# Patient Record
Sex: Female | Born: 1940 | Race: White | Hispanic: No | Marital: Married | Smoking: Current every day smoker
Health system: Southern US, Community
[De-identification: ages and names within clinical notes are randomized; demographics above are authoritative.]

## PROBLEM LIST (undated history)

## (undated) DIAGNOSIS — I4891 Unspecified atrial fibrillation: Secondary | ICD-10-CM

## (undated) DIAGNOSIS — I495 Sick sinus syndrome: Secondary | ICD-10-CM

## (undated) DIAGNOSIS — I4819 Other persistent atrial fibrillation: Secondary | ICD-10-CM

## (undated) DIAGNOSIS — I499 Cardiac arrhythmia, unspecified: Secondary | ICD-10-CM

## (undated) DIAGNOSIS — I10 Essential (primary) hypertension: Secondary | ICD-10-CM

## (undated) DIAGNOSIS — I639 Cerebral infarction, unspecified: Secondary | ICD-10-CM

## (undated) DIAGNOSIS — I350 Nonrheumatic aortic (valve) stenosis: Secondary | ICD-10-CM

## (undated) DIAGNOSIS — G8929 Other chronic pain: Secondary | ICD-10-CM

## (undated) DIAGNOSIS — Z950 Presence of cardiac pacemaker: Secondary | ICD-10-CM

## (undated) DIAGNOSIS — R011 Cardiac murmur, unspecified: Secondary | ICD-10-CM

## (undated) DIAGNOSIS — Z8719 Personal history of other diseases of the digestive system: Secondary | ICD-10-CM

## (undated) DIAGNOSIS — M1990 Unspecified osteoarthritis, unspecified site: Secondary | ICD-10-CM

## (undated) HISTORY — DX: Other chronic pain: G89.29

## (undated) HISTORY — PX: CERVICAL SPINE SURGERY: SHX589

## (undated) HISTORY — DX: Sick sinus syndrome: I49.5

## (undated) HISTORY — PX: OTHER SURGICAL HISTORY: SHX169

## (undated) HISTORY — PX: PACEMAKER IMPLANT: EP1218

## (undated) HISTORY — PX: EYE SURGERY: SHX253

## (undated) HISTORY — DX: Nonrheumatic aortic (valve) stenosis: I35.0

## (undated) HISTORY — DX: Unspecified osteoarthritis, unspecified site: M19.90

---

## 2013-09-19 DIAGNOSIS — Z6831 Body mass index (BMI) 31.0-31.9, adult: Secondary | ICD-10-CM | POA: Insufficient documentation

## 2013-09-19 DIAGNOSIS — Q210 Ventricular septal defect: Secondary | ICD-10-CM

## 2013-09-19 HISTORY — DX: Other obesity due to excess calories: Z68.31

## 2013-09-19 HISTORY — DX: Ventricular septal defect: Q21.0

## 2014-05-25 DIAGNOSIS — F419 Anxiety disorder, unspecified: Secondary | ICD-10-CM | POA: Insufficient documentation

## 2014-05-25 HISTORY — DX: Anxiety disorder, unspecified: F41.9

## 2014-09-02 DIAGNOSIS — R7302 Impaired glucose tolerance (oral): Secondary | ICD-10-CM

## 2014-09-02 HISTORY — DX: Impaired glucose tolerance (oral): R73.02

## 2014-12-14 DIAGNOSIS — I10 Essential (primary) hypertension: Secondary | ICD-10-CM

## 2014-12-14 HISTORY — DX: Essential (primary) hypertension: I10

## 2016-02-18 DIAGNOSIS — G4700 Insomnia, unspecified: Secondary | ICD-10-CM | POA: Insufficient documentation

## 2016-02-18 HISTORY — DX: Insomnia, unspecified: G47.00

## 2017-01-23 DIAGNOSIS — S42292A Other displaced fracture of upper end of left humerus, initial encounter for closed fracture: Secondary | ICD-10-CM

## 2017-01-23 HISTORY — DX: Other displaced fracture of upper end of left humerus, initial encounter for closed fracture: S42.292A

## 2017-08-02 DIAGNOSIS — S52502D Unspecified fracture of the lower end of left radius, subsequent encounter for closed fracture with routine healing: Secondary | ICD-10-CM | POA: Insufficient documentation

## 2017-08-02 HISTORY — DX: Unspecified fracture of the lower end of left radius, subsequent encounter for closed fracture with routine healing: S52.502D

## 2018-01-20 DIAGNOSIS — Z96612 Presence of left artificial shoulder joint: Secondary | ICD-10-CM

## 2018-01-20 HISTORY — DX: Presence of left artificial shoulder joint: Z96.612

## 2018-11-25 DIAGNOSIS — M8589 Other specified disorders of bone density and structure, multiple sites: Secondary | ICD-10-CM

## 2018-11-25 HISTORY — DX: Other specified disorders of bone density and structure, multiple sites: M85.89

## 2019-01-16 DIAGNOSIS — M1711 Unilateral primary osteoarthritis, right knee: Secondary | ICD-10-CM | POA: Insufficient documentation

## 2019-01-16 DIAGNOSIS — M1712 Unilateral primary osteoarthritis, left knee: Secondary | ICD-10-CM | POA: Insufficient documentation

## 2019-01-16 HISTORY — DX: Unilateral primary osteoarthritis, right knee: M17.11

## 2019-01-16 HISTORY — DX: Unilateral primary osteoarthritis, left knee: M17.12

## 2019-05-30 DIAGNOSIS — Z8673 Personal history of transient ischemic attack (TIA), and cerebral infarction without residual deficits: Secondary | ICD-10-CM | POA: Insufficient documentation

## 2019-05-30 HISTORY — DX: Personal history of transient ischemic attack (TIA), and cerebral infarction without residual deficits: Z86.73

## 2019-06-15 DIAGNOSIS — K922 Gastrointestinal hemorrhage, unspecified: Secondary | ICD-10-CM | POA: Insufficient documentation

## 2019-06-15 DIAGNOSIS — Z8673 Personal history of transient ischemic attack (TIA), and cerebral infarction without residual deficits: Secondary | ICD-10-CM

## 2019-06-15 DIAGNOSIS — K219 Gastro-esophageal reflux disease without esophagitis: Secondary | ICD-10-CM | POA: Insufficient documentation

## 2019-06-15 DIAGNOSIS — D509 Iron deficiency anemia, unspecified: Secondary | ICD-10-CM | POA: Insufficient documentation

## 2019-06-15 DIAGNOSIS — I639 Cerebral infarction, unspecified: Secondary | ICD-10-CM | POA: Insufficient documentation

## 2019-06-15 HISTORY — DX: Cerebral infarction, unspecified: I63.9

## 2019-06-15 HISTORY — DX: Personal history of transient ischemic attack (TIA), and cerebral infarction without residual deficits: Z86.73

## 2019-06-15 HISTORY — DX: Iron deficiency anemia, unspecified: D50.9

## 2019-06-15 HISTORY — DX: Gastro-esophageal reflux disease without esophagitis: K21.9

## 2019-06-15 HISTORY — DX: Gastrointestinal hemorrhage, unspecified: K92.2

## 2019-06-16 DIAGNOSIS — I38 Endocarditis, valve unspecified: Secondary | ICD-10-CM | POA: Insufficient documentation

## 2019-06-16 HISTORY — DX: Endocarditis, valve unspecified: I38

## 2019-07-02 DIAGNOSIS — I69328 Other speech and language deficits following cerebral infarction: Secondary | ICD-10-CM | POA: Insufficient documentation

## 2019-07-02 DIAGNOSIS — Z9181 History of falling: Secondary | ICD-10-CM | POA: Insufficient documentation

## 2019-07-02 HISTORY — DX: History of falling: Z91.81

## 2019-07-02 HISTORY — DX: Other speech and language deficits following cerebral infarction: I69.328

## 2019-10-15 ENCOUNTER — Emergency Department (HOSPITAL_BASED_OUTPATIENT_CLINIC_OR_DEPARTMENT_OTHER): Payer: Medicare Other

## 2019-10-15 ENCOUNTER — Inpatient Hospital Stay (HOSPITAL_BASED_OUTPATIENT_CLINIC_OR_DEPARTMENT_OTHER)
Admission: EM | Admit: 2019-10-15 | Discharge: 2019-10-20 | DRG: 258 | Disposition: A | Discharge status: Home Health | Payer: Medicare Other | Attending: Internal Medicine | Admitting: Internal Medicine

## 2019-10-15 ENCOUNTER — Other Ambulatory Visit: Payer: Self-pay

## 2019-10-15 ENCOUNTER — Encounter (HOSPITAL_BASED_OUTPATIENT_CLINIC_OR_DEPARTMENT_OTHER): Payer: Self-pay

## 2019-10-15 DIAGNOSIS — Z950 Presence of cardiac pacemaker: Secondary | ICD-10-CM | POA: Diagnosis not present

## 2019-10-15 DIAGNOSIS — I5033 Acute on chronic diastolic (congestive) heart failure: Secondary | ICD-10-CM | POA: Diagnosis present

## 2019-10-15 DIAGNOSIS — R748 Abnormal levels of other serum enzymes: Secondary | ICD-10-CM

## 2019-10-15 DIAGNOSIS — I480 Paroxysmal atrial fibrillation: Secondary | ICD-10-CM | POA: Diagnosis present

## 2019-10-15 DIAGNOSIS — I509 Heart failure, unspecified: Secondary | ICD-10-CM | POA: Diagnosis present

## 2019-10-15 DIAGNOSIS — F329 Major depressive disorder, single episode, unspecified: Secondary | ICD-10-CM | POA: Diagnosis present

## 2019-10-15 DIAGNOSIS — I110 Hypertensive heart disease with heart failure: Secondary | ICD-10-CM | POA: Diagnosis present

## 2019-10-15 DIAGNOSIS — I10 Essential (primary) hypertension: Secondary | ICD-10-CM | POA: Diagnosis not present

## 2019-10-15 DIAGNOSIS — I248 Other forms of acute ischemic heart disease: Secondary | ICD-10-CM | POA: Diagnosis present

## 2019-10-15 DIAGNOSIS — Z45018 Encounter for adjustment and management of other part of cardiac pacemaker: Secondary | ICD-10-CM | POA: Diagnosis not present

## 2019-10-15 DIAGNOSIS — K7200 Acute and subacute hepatic failure without coma: Secondary | ICD-10-CM | POA: Diagnosis present

## 2019-10-15 DIAGNOSIS — F419 Anxiety disorder, unspecified: Secondary | ICD-10-CM | POA: Diagnosis present

## 2019-10-15 DIAGNOSIS — Z45010 Encounter for checking and testing of cardiac pacemaker pulse generator [battery]: Secondary | ICD-10-CM | POA: Diagnosis not present

## 2019-10-15 DIAGNOSIS — Z96612 Presence of left artificial shoulder joint: Secondary | ICD-10-CM | POA: Diagnosis present

## 2019-10-15 DIAGNOSIS — I5043 Acute on chronic combined systolic (congestive) and diastolic (congestive) heart failure: Secondary | ICD-10-CM | POA: Diagnosis not present

## 2019-10-15 DIAGNOSIS — I495 Sick sinus syndrome: Secondary | ICD-10-CM | POA: Diagnosis present

## 2019-10-15 DIAGNOSIS — Z20822 Contact with and (suspected) exposure to covid-19: Secondary | ICD-10-CM | POA: Diagnosis present

## 2019-10-15 DIAGNOSIS — Z8673 Personal history of transient ischemic attack (TIA), and cerebral infarction without residual deficits: Secondary | ICD-10-CM

## 2019-10-15 DIAGNOSIS — Z79899 Other long term (current) drug therapy: Secondary | ICD-10-CM

## 2019-10-15 DIAGNOSIS — F17290 Nicotine dependence, other tobacco product, uncomplicated: Secondary | ICD-10-CM | POA: Diagnosis present

## 2019-10-15 DIAGNOSIS — G9341 Metabolic encephalopathy: Secondary | ICD-10-CM | POA: Diagnosis present

## 2019-10-15 DIAGNOSIS — I5031 Acute diastolic (congestive) heart failure: Secondary | ICD-10-CM | POA: Diagnosis not present

## 2019-10-15 DIAGNOSIS — Z7901 Long term (current) use of anticoagulants: Secondary | ICD-10-CM

## 2019-10-15 DIAGNOSIS — I083 Combined rheumatic disorders of mitral, aortic and tricuspid valves: Secondary | ICD-10-CM | POA: Diagnosis present

## 2019-10-15 DIAGNOSIS — I629 Nontraumatic intracranial hemorrhage, unspecified: Secondary | ICD-10-CM | POA: Diagnosis present

## 2019-10-15 DIAGNOSIS — R4701 Aphasia: Secondary | ICD-10-CM | POA: Diagnosis present

## 2019-10-15 DIAGNOSIS — I5023 Acute on chronic systolic (congestive) heart failure: Secondary | ICD-10-CM | POA: Diagnosis not present

## 2019-10-15 HISTORY — DX: Essential (primary) hypertension: I10

## 2019-10-15 HISTORY — DX: Unspecified atrial fibrillation: I48.91

## 2019-10-15 HISTORY — DX: Cerebral infarction, unspecified: I63.9

## 2019-10-15 HISTORY — DX: Presence of cardiac pacemaker: Z95.0

## 2019-10-15 HISTORY — DX: Personal history of transient ischemic attack (TIA), and cerebral infarction without residual deficits: Z86.73

## 2019-10-15 LAB — CBC WITH DIFFERENTIAL/PLATELET
Abs Immature Granulocytes: 0.02 10*3/uL (ref 0.00–0.07)
Basophils Absolute: 0.1 10*3/uL (ref 0.0–0.1)
Basophils Relative: 1 %
Eosinophils Absolute: 0.1 10*3/uL (ref 0.0–0.5)
Eosinophils Relative: 1 %
HCT: 44.1 % (ref 36.0–46.0)
Hemoglobin: 13.9 g/dL (ref 12.0–15.0)
Immature Granulocytes: 0 %
Lymphocytes Relative: 18 %
Lymphs Abs: 1.3 10*3/uL (ref 0.7–4.0)
MCH: 27.8 pg (ref 26.0–34.0)
MCHC: 31.5 g/dL (ref 30.0–36.0)
MCV: 88.2 fL (ref 80.0–100.0)
Monocytes Absolute: 0.4 10*3/uL (ref 0.1–1.0)
Monocytes Relative: 6 %
Neutro Abs: 5.6 10*3/uL (ref 1.7–7.7)
Neutrophils Relative %: 74 %
Platelets: 265 10*3/uL (ref 150–400)
RBC: 5 MIL/uL (ref 3.87–5.11)
RDW: 16 % — ABNORMAL HIGH (ref 11.5–15.5)
WBC: 7.6 10*3/uL (ref 4.0–10.5)
nRBC: 0 % (ref 0.0–0.2)

## 2019-10-15 LAB — BASIC METABOLIC PANEL
Anion gap: 8 (ref 5–15)
BUN: 16 mg/dL (ref 8–23)
CO2: 28 mmol/L (ref 22–32)
Calcium: 9.1 mg/dL (ref 8.9–10.3)
Chloride: 102 mmol/L (ref 98–111)
Creatinine, Ser: 0.85 mg/dL (ref 0.44–1.00)
GFR calc Af Amer: >60 mL/min (ref >60)
GFR calc non Af Amer: >60 mL/min (ref >60)
Glucose, Bld: 107 mg/dL — ABNORMAL HIGH (ref 70–99)
Potassium: 4.4 mmol/L (ref 3.5–5.1)
Sodium: 138 mmol/L (ref 135–145)

## 2019-10-15 LAB — TROPONIN I (HIGH SENSITIVITY)
Troponin I (High Sensitivity): 31 ng/L — ABNORMAL HIGH (ref <18)
Troponin I (High Sensitivity): 29 ng/L — ABNORMAL HIGH (ref <18)

## 2019-10-15 LAB — BRAIN NATRIURETIC PEPTIDE: B Natriuretic Peptide: 1346.3 pg/mL — ABNORMAL HIGH (ref 0.0–100.0)

## 2019-10-15 LAB — SARS CORONAVIRUS 2 BY RT PCR (HOSPITAL ORDER, PERFORMED IN ~~LOC~~ HOSPITAL LAB): SARS Coronavirus 2: NEGATIVE

## 2019-10-15 MED ORDER — PROPAFENONE HCL 150 MG PO TABS
150.0000 mg | ORAL_TABLET | Freq: Every day | ORAL | Status: DC
  Administered 2019-10-16 – 2019-10-20 (×5): 150 mg via ORAL
  Filled 2019-10-15 (×5): qty 1

## 2019-10-15 MED ORDER — CITALOPRAM HYDROBROMIDE 10 MG PO TABS
10.0000 mg | ORAL_TABLET | Freq: Every day | ORAL | Status: DC
  Administered 2019-10-16 – 2019-10-20 (×5): 10 mg via ORAL
  Filled 2019-10-15 (×5): qty 1

## 2019-10-15 MED ORDER — METOPROLOL SUCCINATE ER 50 MG PO TB24
50.0000 mg | ORAL_TABLET | Freq: Every day | ORAL | Status: DC
  Administered 2019-10-16 – 2019-10-20 (×5): 50 mg via ORAL
  Filled 2019-10-15 (×5): qty 1

## 2019-10-15 MED ORDER — FUROSEMIDE 10 MG/ML IJ SOLN
20.0000 mg | Freq: Once | INTRAMUSCULAR | Status: AC
  Administered 2019-10-15: 20 mg via INTRAVENOUS
  Filled 2019-10-15: qty 2

## 2019-10-15 MED ORDER — APIXABAN 5 MG PO TABS
5.0000 mg | ORAL_TABLET | Freq: Two times a day (BID) | ORAL | Status: AC
  Administered 2019-10-15 – 2019-10-16 (×2): 5 mg via ORAL
  Filled 2019-10-15 (×3): qty 1

## 2019-10-15 MED ORDER — CLONAZEPAM 0.5 MG PO TABS
0.5000 mg | ORAL_TABLET | Freq: Every evening | ORAL | Status: DC | PRN
  Administered 2019-10-16 – 2019-10-19 (×3): 0.5 mg via ORAL
  Filled 2019-10-15 (×3): qty 1

## 2019-10-15 MED ORDER — ROPINIROLE HCL 1 MG PO TABS
1.0000 mg | ORAL_TABLET | Freq: Every day | ORAL | Status: DC
  Administered 2019-10-15 – 2019-10-19 (×5): 1 mg via ORAL
  Filled 2019-10-15 (×6): qty 1

## 2019-10-15 MED ORDER — MAGNESIUM 400 MG PO TABS: 400.0000 mg | ORAL_TABLET | Freq: Every day | ORAL | Status: DC

## 2019-10-15 MED ORDER — FUROSEMIDE 10 MG/ML IJ SOLN
20.0000 mg | Freq: Two times a day (BID) | INTRAMUSCULAR | Status: DC
  Administered 2019-10-15 – 2019-10-16 (×2): 20 mg via INTRAVENOUS
  Filled 2019-10-15 (×2): qty 2

## 2019-10-15 MED ORDER — MAGNESIUM OXIDE 400 (241.3 MG) MG PO TABS
400.0000 mg | ORAL_TABLET | Freq: Every day | ORAL | Status: DC
  Administered 2019-10-16 – 2019-10-20 (×5): 400 mg via ORAL
  Filled 2019-10-15 (×5): qty 1

## 2019-10-15 MED ORDER — FAMOTIDINE-CA CARB-MAG HYDROX 10-800-165 MG PO CHEW: 1.0000 | CHEWABLE_TABLET | Freq: Every day | ORAL | Status: DC

## 2019-10-15 MED ORDER — LOSARTAN POTASSIUM 25 MG PO TABS: 25.0000 mg | ORAL_TABLET | Freq: Every day | ORAL | Status: DC

## 2019-10-15 MED ORDER — FAMOTIDINE 20 MG PO TABS
10.0000 mg | ORAL_TABLET | Freq: Every day | ORAL | Status: DC
  Administered 2019-10-16 – 2019-10-20 (×5): 10 mg via ORAL
  Filled 2019-10-15 (×5): qty 1

## 2019-10-15 MED ORDER — POTASSIUM 99 MG PO TABS: 99.0000 mg | ORAL_TABLET | Freq: Every day | ORAL | Status: DC

## 2019-10-15 NOTE — ED Triage Notes (Addendum)
C/o flu like sx x 4 days-states she "get out of breath when she walks"-pt NAD-to triage in w/c

## 2019-10-15 NOTE — ED Provider Notes (Signed)
[facility] EMERGENCY DEPARTMENT Provider Note   CSN: [id number] Arrival date & time: 10/15/19  1228     History Chief Complaint  Patient presents with  . Cough    Kaitlyn [name] is a 79 y.o. female.  The history is provided by the patient and medical records. No language interpreter was used.  Cough  Kaitlyn [name] is a 79 y.o. female who presents to the Emergency Department complaining of sob.  She presents to the ED complaining of three days of SOB and DOE.  She is unable to ambulate her home without significant sob.  Denies chest pain, fever, leg edema.  Has a cough and head congestion.  No hemoptysis.  No known COVID 19 exposures.  Has not received the COVID19 vaccine.  Lives with husband.   Had a stroke in January 2021 - admitted to [facility].  Sxs are moderate, constant, worsening.      Past Medical History:  Diagnosis Date  . A-fib (HCC)   . Hypertension   . Stroke [facility])     Patient Active Problem List   Diagnosis Date Noted  . Acute exacerbation of CHF (congestive heart failure) (HCC) 10/15/2019       OB History   No obstetric history on file.     No family history on file.  Social History   Tobacco Use  . Smoking status: Current Every Day Smoker    Types: E-cigarettes  . Smokeless tobacco: Never Used  Substance Use Topics  . Alcohol use: Never  . Drug use: Never    Home Medications Prior to Admission medications   Medication Sig Start Date End Date Taking? Authorizing Provider  apixaban (ELIQUIS) 5 MG TABS tablet Take by mouth. 07/21/19  Yes [provider]  aspirin 325 MG EC tablet  07/13/19  Yes [provider]  atorvastatin (LIPITOR) 40 MG tablet Take by mouth. 06/16/19  Yes [provider]  clonazePAM (KLONOPIN) 0.5 MG tablet TAKE 1 TABLET(0.5 MG) BY MOUTH AT BEDTIME AS NEEDED FOR ANXIETY 12/15/14  Yes [provider]  omeprazole (PRILOSEC) 40 MG capsule Take by mouth. 09/27/18  Yes [provider]  rOPINIRole (REQUIP) 0.5 MG tablet TAKE 1 TABLET BY MOUTH AT BEDTIME FOR 1 WEEK, THEN INCREASE TO 2 TABLETS AT BEDTIME IF NO BETTER 11/05/18  Yes [provider]  diltiazem (CARDIZEM) 60 MG tablet  06/16/19   [provider]  gabapentin (NEURONTIN) 100 MG capsule Take 100 mg by mouth 3 (three) times daily. 05/03/19   [provider]  hydrochlorothiazide (HYDRODIURIL) 12.5 MG tablet Take by mouth.    [provider]    Allergies    Patient has no known allergies.  Review of Systems   Review of Systems  Respiratory: Positive for cough.   All other systems reviewed and are negative.   Physical Exam Updated Vital Signs BP (!) 159/102 (BP Location: Left Arm)   Pulse 65   Temp 97.7 F (36.5 C) (Oral)   Resp 18   Ht 5\' 2"  (1.575 m)   Wt 63.5 kg   SpO2 98%   BMI 25.61 kg/m   Physical Exam Vitals and nursing note reviewed.  Constitutional:      Appearance: She is well-developed.  HENT:     Head: Normocephalic and atraumatic.  Cardiovascular:     Rate and Rhythm: Normal rate and regular rhythm.     Heart sounds: Murmur present.  Pulmonary:     Effort: Pulmonary effort  is normal. No respiratory distress.     Breath sounds: Normal breath sounds.  Abdominal:     Palpations: Abdomen is soft.     Tenderness: There is no abdominal tenderness. There is no guarding or rebound.  Musculoskeletal:        General: No tenderness.     Comments: 1+ pitting edema to BLE  Skin:    General: Skin is warm and dry.  Neurological:     Mental Status: She is alert and oriented to person, place, and time.  Psychiatric:        Behavior: Behavior normal.     ED Results / Procedures / Treatments   Labs (all labs ordered are listed, but only abnormal results are displayed) Labs Reviewed  CBC WITH DIFFERENTIAL/PLATELET - Abnormal; Notable for the following components:      Result Value   RDW 16.0 (*)    All other components within normal limits   BASIC METABOLIC PANEL - Abnormal; Notable for the following components:   Glucose, Bld 107 (*)    All other components within normal limits  BRAIN NATRIURETIC PEPTIDE - Abnormal; Notable for the following components:   B Natriuretic Peptide 1,346.3 (*)    All other components within normal limits  TROPONIN I (HIGH SENSITIVITY) - Abnormal; Notable for the following components:   Troponin I (High Sensitivity) 31 (*)    All other components within normal limits  SARS CORONAVIRUS 2 BY RT PCR (HOSPITAL ORDER, [facility])  TROPONIN I (HIGH SENSITIVITY)    EKG EKG Interpretation  Date/Time:  Wednesday Oct 15 2019 12:43:04 EDT Ventricular Rate:  65 PR Interval:    QRS Duration: 174 QT Interval:  512 QTC Calculation: 532 R Axis:   -88 Text Interpretation: Ventricular-paced rhythm Abnormal ECG no prior available for comparison Confirmed by Quintella Reichert [phone]) on 10/15/2019 1:09:51 PM   Radiology DG Chest Portable 1 View  Result Date: 10/15/2019 CLINICAL DATA:  Cough and shortness of breath. Flu like symptoms for 4 days. EXAM: PORTABLE CHEST 1 VIEW COMPARISON:  Radiograph 11/22/2017 FINDINGS: Left-sided pacemaker remains in place. Cardiomegaly is increased from prior exam. Unchanged mediastinal contours. There is a retrocardiac hiatal hernia. Interstitial and peribronchial thickening suspicious for pulmonary edema. Small pleural effusions. No confluent airspace disease. Reverse left shoulder arthroplasty. No acute osseous abnormalities are seen. IMPRESSION: 1. Cardiomegaly and small pleural effusions. Interstitial thickening suspicious for pulmonary edema. Findings suspicious for CHF. 2. Hiatal hernia. Electronically Signed   By: Keith Rake M.D.   On: 10/15/2019 13:36    Procedures Procedures (including critical care time)  Medications Ordered in ED Medications  furosemide (LASIX) injection 20 mg (has no administration in time range)  furosemide  (LASIX) injection 20 mg (20 mg Intravenous Given 10/15/19 1428)    ED Course  I have reviewed the triage vital signs and the nursing notes.  Pertinent labs & imaging results that were available during my care of the patient were reviewed by me and considered in my medical decision making (see chart for details).    MDM Rules/Calculators/A&P                     Patient with history of a fib, aortic stenosis, CVA here for evaluation of shortness of breath and dyspnea on exertion for the last few days. She is non-toxic appearing on evaluation but does have tachypnea and significant shortness of breath on minimal exertion. Chest x-ray with pulmonary edema. She was  treated with Lasix for CHF exacerbation. Discussed with patient husband findings of studies. Will admit for further treatment given considerable symptoms. Hospitalist consulted for admission.  Final Clinical Impression(s) / ED Diagnoses Final diagnoses:  Acute congestive heart failure, unspecified heart failure type [facility])    Rx / DC Orders ED Discharge Orders    None       Tilden Fossa, MD 10/15/19 1624

## 2019-10-15 NOTE — H&P (Signed)
History and Physical    Kaitlyn [name] YDX:[id number] DOB: 1941/05/18 DOA: 10/15/2019  PCP: Patient, No Pcp Per  Patient coming from: Home.  Chief Complaint: Shortness of breath.  HPI: Kaitlyn [name] is a 79 y.o. female with history of A. fib, pacemaker placement, recent stroke in January 2021 with some speech difficulties, grade 2 diastolic dysfunction with severe aortic stenosis per the notes in care everywhere has been experiencing increasing shortness of breath over the last 3 days with lower extremity edema.  Denies any chest pain.  Denies any productive cough fever or chills.  ED Course: In the ER at bedside I find patient chest x-ray shows congestion and pleural effusion with BNP of 1300 high-sensitivity troponin was 31 and 29.  Covid test was negative EKG shows paced rhythm.  Patient was placed on Lasix 20 mg IV every 2 admitted for further management of acute CHF.  Review of Systems: As per HPI, rest all negative.   Past Medical History:  Diagnosis Date  . A-fib (HCC)   . Hypertension   . Presence of permanent cardiac pacemaker   . Stroke [facility])     Past Surgical History:  Procedure Laterality Date  . CERVICAL SPINE SURGERY    . PACEMAKER IMPLANT    . shoulder replacement Left      reports that she has been smoking e-cigarettes. She has never used smokeless tobacco. She reports that she does not drink alcohol or use drugs.  Allergies  Allergen Reactions  . Lisinopril Cough         Family History  Family history unknown: Yes    Prior to Admission medications   Medication Sig Start Date End Date Taking? Authorizing Provider  apixaban (ELIQUIS) 5 MG TABS tablet Take 5 mg by mouth 2 (two) times daily.  07/21/19  Yes [provider]  citalopram (CELEXA) 10 MG tablet Take 10 mg by mouth daily. 08/15/19  Yes [provider]  clonazePAM (KLONOPIN) 0.5 MG tablet Take 0.5 mg by mouth at bedtime as needed for anxiety.  12/15/14  Yes [provider]   famotidine-calcium carbonate-magnesium hydroxide (PEPCID COMPLETE) 10-800-165 MG chewable tablet Chew 1 tablet by mouth daily.   Yes [provider]  Magnesium 400 MG TABS Take 400 mg by mouth daily.   Yes [provider]  metoprolol succinate (TOPROL-XL) 50 MG 24 hr tablet Take 50 mg by mouth daily. 07/21/19  Yes [provider]  Potassium 99 MG TABS Take 99 mg by mouth daily.   Yes [provider]  propafenone (RYTHMOL) 150 MG tablet Take 150 mg by mouth daily. 10/09/19  Yes [provider]  rOPINIRole (REQUIP) 0.5 MG tablet Take 1 mg by mouth at bedtime.  11/05/18  Yes [provider]    Physical Exam: Constitutional: Moderately built and nourished. Vitals:   10/15/19 1419 10/15/19 1607 10/15/19 1827 10/15/19 2023  BP: (!) 157/98 (!) 159/102 (!) 171/103 138/75  Pulse: 65 65 64 65  Resp: 18 18 16 16   Temp:   (!) 97.2 F (36.2 C) 97.9 F (36.6 C)  TempSrc:      SpO2: 96% 98% 98% 95%  Weight:   69 kg   Height:   5\' 2"  (1.575 m)    Eyes: Anicteric no pallor. ENMT: No discharge from the ears eyes nose or mouth. Neck: No mass felt.  No neck rigidity.  JVD elevated. Respiratory: No rhonchi crepitations. Cardiovascular: S1-S2 heard with systolic murmur. Abdomen: Soft nontender bowel  sounds present. Musculoskeletal: Mild edema of the both lower extremity. Skin: No rash. Neurologic: Alert awake oriented time place and person.  Moves all extremities. Psychiatric: Appears normal. normal affect.   Labs on Admission: I have personally reviewed following labs and imaging studies  CBC: Recent Labs  Lab 10/15/19 1337  WBC 7.6  NEUTROABS 5.6  HGB 13.9  HCT 44.1  MCV 88.2  PLT 626   Basic Metabolic Panel: Recent Labs  Lab 10/15/19 1337  NA 138  K 4.4  CL 102  CO2 28  GLUCOSE 107*  BUN 16  CREATININE 0.85  CALCIUM 9.1   GFR: Estimated Creatinine Clearance: 50.5 mL/min (by C-G formula based on SCr of 0.85 mg/dL). Liver  Function Tests: No results for input(s): AST, ALT, ALKPHOS, BILITOT, PROT, ALBUMIN in the last 168 hours. No results for input(s): LIPASE, AMYLASE in the last 168 hours. No results for input(s): AMMONIA in the last 168 hours. Coagulation Profile: No results for input(s): INR, PROTIME in the last 168 hours. Cardiac Enzymes: No results for input(s): CKTOTAL, CKMB, CKMBINDEX, TROPONINI in the last 168 hours. BNP (last 3 results) No results for input(s): PROBNP in the last 8760 hours. HbA1C: No results for input(s): HGBA1C in the last 72 hours. CBG: No results for input(s): GLUCAP in the last 168 hours. Lipid Profile: No results for input(s): CHOL, HDL, LDLCALC, TRIG, CHOLHDL, LDLDIRECT in the last 72 hours. Thyroid Function Tests: No results for input(s): TSH, T4TOTAL, FREET4, T3FREE, THYROIDAB in the last 72 hours. Anemia Panel: No results for input(s): VITAMINB12, FOLATE, FERRITIN, TIBC, IRON, RETICCTPCT in the last 72 hours. Urine analysis: No results found for: COLORURINE, APPEARANCEUR, LABSPEC, PHURINE, GLUCOSEU, HGBUR, BILIRUBINUR, KETONESUR, PROTEINUR, UROBILINOGEN, NITRITE, LEUKOCYTESUR Sepsis Labs: @LABRCNTIP (procalcitonin:4,lacticidven:4) ) Recent Results (from the past 240 hour(s))  SARS Coronavirus 2 by RT PCR (hospital order, performed in [facility] hospital lab) Nasopharyngeal Nasopharyngeal Swab     Status: None   Collection Time: 10/15/19  3:07 PM   Specimen: Nasopharyngeal Swab  Result Value Ref Range Status   SARS Coronavirus 2 NEGATIVE NEGATIVE Final    Comment: (NOTE) SARS-CoV-2 target nucleic acids are NOT DETECTED. The SARS-CoV-2 RNA is generally detectable in upper and lower respiratory specimens during the acute phase of infection. The lowest concentration of SARS-CoV-2 viral copies this assay can detect is 250 copies / mL. A negative result does not preclude SARS-CoV-2 infection and should not be used as the sole basis for treatment or other patient  management decisions.  A negative result may occur with improper specimen collection / handling, submission of specimen other than nasopharyngeal swab, presence of viral mutation(s) within the areas targeted by this assay, and inadequate number of viral copies (<250 copies / mL). A negative result must be combined with clinical observations, patient history, and epidemiological information. Fact Sheet for Patients:   StrictlyIdeas.no Fact Sheet for Healthcare Providers: BankingDealers.co.za This test is not yet approved or cleared  by the [place] FDA and has been authorized for detection and/or diagnosis of SARS-CoV-2 by FDA under an Emergency Use Authorization (EUA).  This EUA will remain in effect (meaning this test can be used) for the duration of the COVID-19 declaration under Section 564(b)(1) of the Act, 21 U.S.C. section 360bbb-3(b)(1), unless the authorization is terminated or revoked sooner. Performed at [facility], [address] [place], [place] [address]      Radiological Exams on Admission: DG Chest Portable 1 View  Result Date: 10/15/2019 CLINICAL DATA:  Cough and shortness of breath. Flu like symptoms for 4 days. EXAM: PORTABLE CHEST 1 VIEW COMPARISON:  Radiograph 11/22/2017 FINDINGS: Left-sided pacemaker remains in place. Cardiomegaly is increased from prior exam. Unchanged mediastinal contours. There is a retrocardiac hiatal hernia. Interstitial and peribronchial thickening suspicious for pulmonary edema. Small pleural effusions. No confluent airspace disease. Reverse left shoulder arthroplasty. No acute osseous abnormalities are seen. IMPRESSION: 1. Cardiomegaly and small pleural effusions. Interstitial thickening suspicious for pulmonary edema. Findings suspicious for CHF. 2. Hiatal hernia. Electronically Signed   By: Narda Rutherford M.D.   On: 10/15/2019 13:36    EKG: Independently reviewed.  Paced  rhythm.  Assessment/Plan Principal Problem:   Acute exacerbation of CHF (congestive heart failure) (HCC) Active Problems:   PAF (paroxysmal atrial fibrillation) (HCC)   Essential hypertension   History of ischemic stroke   Acute CHF (congestive heart failure) (HCC)    1. Acute on chronic diastolic CHF with history of severe aortic stenosis we will continue with Lasix 20 mg IV every 12 and closely follow intake output Daily weights.  Check 2D echo consult cardiology. 2. Paroxysmal atrial fibrillation on apixaban Rythmol and metoprolol. 3. Recent stroke in July 2021 with some speech difficulties on apixaban. 4. History of anxiety and depression on citalopram. 5. History of pacemaker placement.  Given that patient has CHF with severe aortic stenosis diastolic dysfunction will need close monitoring for any further worsening in inpatient status.   DVT prophylaxis: Apixaban. Code Status: Full code. Family Communication: Discussed with patient. Disposition Plan: Home. Consults called: Cardiology. Admission status: Inpatient.   Eduard Clos MD [facility] Pager [phone].  If 7PM-7AM, please contact night-coverage [email or web address] Password [facility]  10/15/2019, 9:47 PM

## 2019-10-15 NOTE — Plan of Care (Signed)
Received signout from Dr. Madilyn Hook at [facility] Kaitlyn [name] is a 79 year old female with pmh A. Fibon Elqius, AS, HTN, and CVA in 05/2019 presented with complaints of shortness of breath.  Labs significant for BNP 1346.3 and trop 31. CXR shows cardiolomegaly and small pleural effusions and interstitial thickening concerning for CHF.  Patient reported to have medication hesitancy and will need teaching.  Transfer requested for new onset CHF.  Patient was given 20 mg of Lasix IV.  Accepted to a cardiac telemetry bed as inpatient at [facility].

## 2019-10-15 NOTE — Progress Notes (Signed)
Patient arrived from [facility]. Alert and oriented x 4. VSS. Oriented to call bell, bed, and room. Admitted to central telemetry. [facility] admissions paged. Will continue to monitor.

## 2019-10-16 ENCOUNTER — Inpatient Hospital Stay (HOSPITAL_COMMUNITY): Payer: Medicare Other

## 2019-10-16 DIAGNOSIS — I5023 Acute on chronic systolic (congestive) heart failure: Secondary | ICD-10-CM

## 2019-10-16 DIAGNOSIS — I480 Paroxysmal atrial fibrillation: Secondary | ICD-10-CM

## 2019-10-16 DIAGNOSIS — I5043 Acute on chronic combined systolic (congestive) and diastolic (congestive) heart failure: Secondary | ICD-10-CM

## 2019-10-16 DIAGNOSIS — I10 Essential (primary) hypertension: Secondary | ICD-10-CM

## 2019-10-16 LAB — CBC WITH DIFFERENTIAL/PLATELET
Abs Immature Granulocytes: 0.07 10*3/uL (ref 0.00–0.07)
Basophils Absolute: 0.1 10*3/uL (ref 0.0–0.1)
Basophils Relative: 1 %
Eosinophils Absolute: 0.1 10*3/uL (ref 0.0–0.5)
Eosinophils Relative: 2 %
HCT: 41.8 % (ref 36.0–46.0)
Hemoglobin: 13.3 g/dL (ref 12.0–15.0)
Immature Granulocytes: 1 %
Lymphocytes Relative: 27 %
Lymphs Abs: 1.8 10*3/uL (ref 0.7–4.0)
MCH: 27.8 pg (ref 26.0–34.0)
MCHC: 31.8 g/dL (ref 30.0–36.0)
MCV: 87.4 fL (ref 80.0–100.0)
Monocytes Absolute: 0.6 10*3/uL (ref 0.1–1.0)
Monocytes Relative: 9 %
Neutro Abs: 4.2 10*3/uL (ref 1.7–7.7)
Neutrophils Relative %: 60 %
Platelets: 249 10*3/uL (ref 150–400)
RBC: 4.78 MIL/uL (ref 3.87–5.11)
RDW: 15.8 % — ABNORMAL HIGH (ref 11.5–15.5)
WBC: 6.9 10*3/uL (ref 4.0–10.5)
nRBC: 0 % (ref 0.0–0.2)

## 2019-10-16 LAB — BASIC METABOLIC PANEL
Anion gap: 9 (ref 5–15)
BUN: 19 mg/dL (ref 8–23)
CO2: 28 mmol/L (ref 22–32)
Calcium: 9.4 mg/dL (ref 8.9–10.3)
Chloride: 104 mmol/L (ref 98–111)
Creatinine, Ser: 0.89 mg/dL (ref 0.44–1.00)
GFR calc Af Amer: >60 mL/min (ref >60)
GFR calc non Af Amer: >60 mL/min (ref >60)
Glucose, Bld: 111 mg/dL — ABNORMAL HIGH (ref 70–99)
Potassium: 3.5 mmol/L (ref 3.5–5.1)
Sodium: 141 mmol/L (ref 135–145)

## 2019-10-16 LAB — ECHOCARDIOGRAM COMPLETE
Height: 62 in
Weight: 2369.6 oz

## 2019-10-16 LAB — MAGNESIUM: Magnesium: 1.7 mg/dL (ref 1.7–2.4)

## 2019-10-16 LAB — TSH: TSH: 2.824 u[IU]/mL (ref 0.350–4.500)

## 2019-10-16 MED ORDER — POTASSIUM CHLORIDE CRYS ER 20 MEQ PO TBCR
20.0000 meq | EXTENDED_RELEASE_TABLET | Freq: Every day | ORAL | Status: DC
  Administered 2019-10-17 – 2019-10-20 (×4): 20 meq via ORAL
  Filled 2019-10-16 (×4): qty 1

## 2019-10-16 MED ORDER — FUROSEMIDE 20 MG PO TABS
20.0000 mg | ORAL_TABLET | Freq: Every day | ORAL | Status: DC
  Administered 2019-10-17 – 2019-10-20 (×4): 20 mg via ORAL
  Filled 2019-10-16 (×4): qty 1

## 2019-10-16 MED ORDER — POTASSIUM CHLORIDE CRYS ER 20 MEQ PO TBCR
40.0000 meq | EXTENDED_RELEASE_TABLET | Freq: Every day | ORAL | Status: DC
  Administered 2019-10-16: 40 meq via ORAL
  Filled 2019-10-16: qty 2

## 2019-10-16 NOTE — Progress Notes (Addendum)
PROGRESS NOTE    Kaitlyn [name]  ION:[id number] DOB: Jul 26, 1940 DOA: 10/15/2019 PCP: Patient, No Pcp Per   Brief Narrative: 79 year old with past medical history significant for A. fib, status post pacemaker, recent stroke January 2021 10 in dysarthria, grade 2 diastolic dysfunction, severe aortic stenosis, who presents complaining of worsening shortness of breath over the last 3 days prior to admission and lower extremity edema.  Evaluation in the ED chest x-ray showed congestion and pleural effusion, BNP 1300, troponin 31/29.  Covid test negative, EKG shows paced rhythm.  Patient has been admitted for heart failure exacerbation.    Assessment & Plan:   Principal Problem:   Acute exacerbation of CHF (congestive heart failure) (HCC) Active Problems:   PAF (paroxysmal atrial fibrillation) (HCC)   Essential hypertension   History of ischemic stroke   Acute CHF (congestive heart failure) (HCC)  1-Acute on chronic diastolic heart failure exacerbation, severe aortic stenosis: -Patient presented with worsening shortness of breath, chest x-ray with pulmonary edema, BNP elevated at 1300. -She has received 3 doses of IV lasix. Discussed with cardiology, plan to transition to oral lasix.  -Weight; 152---148. -Output 3.4 L.  2-Paroxysmal A. fib:  Status post pacemaker, cardiology will interrogate pacer and evaluate battery  Continue with apixaban and metoprolol. On propafenone.  3-Recent stroke Speech difficulties Continue with apixaban  4-History of anxiety or depression: Continue with citalopram 5-History of GI  bleed; monitor on Eliquis.  6-Aortic stenosis, moderate to severe ECHO from [facility]. ECHO repeated here. Cardiology consulted.   Estimated body mass index is 27.09 kg/m as calculated from the following:   Height as of this encounter: 5\' 2"  (1.575 m).   Weight as of this encounter: 67.2 kg.   DVT prophylaxis: Apixaban Code Status: Full code Family Communication: care  discussed with patient. Cardiology updated Husband.  Disposition Plan:  Status is: Inpatient  Remains inpatient appropriate because:Ongoing diagnostic testing needed not appropriate for outpatient work up   Dispo: The patient is from: Home              Anticipated d/c is to: Home              Anticipated d/c date is: 1 day              Patient currently is not medically stable to d/c.     Consultants:   Cardiology   Procedures:   None  Antimicrobials:  None  Subjective: She is breathing better, denies chest pain.    Objective: Vitals:   10/15/19 2023 10/16/19 0101 10/16/19 0105 10/16/19 0411  BP: 138/75  (!) 149/93 (!) 152/94  Pulse: 65  66 64  Resp: 16  16 16   Temp: 97.9 F (36.6 C)  (!) 97.5 F (36.4 C) 97.6 F (36.4 C)  TempSrc:   Oral Oral  SpO2: 95%  98% 94%  Weight:  67.2 kg    Height:        Intake/Output Summary (Last 24 hours) at 10/16/2019 0731 Last data filed at 10/16/2019 0644 Gross per 24 hour  Intake --  Output 3500 ml  Net -3500 ml   Filed Weights   10/15/19 1242 10/15/19 1827 10/16/19 0101  Weight: 63.5 kg 69 kg 67.2 kg    Examination:  General exam: Appears calm and comfortable  Respiratory system: Clear to auscultation. Respiratory effort normal. Cardiovascular system: S1 & S2 heard, RRR. No JVD, murmurs, rubs, gallops or clicks. No pedal edema. Gastrointestinal system: Abdomen is nondistended, soft and  nontender. No organomegaly or masses felt. Normal bowel sounds heard. Central nervous system: Alert and oriented.  Extremities: Symmetric 5 x 5 power. Skin: No rashes, lesions or ulcers    Data Reviewed: I have personally reviewed following labs and imaging studies  CBC: Recent Labs  Lab 10/15/19 1337 10/16/19 0455  WBC 7.6 6.9  NEUTROABS 5.6 4.2  HGB 13.9 13.3  HCT 44.1 41.8  MCV 88.2 87.4  PLT 265 249   Basic Metabolic Panel: Recent Labs  Lab 10/15/19 1337 10/16/19 0455  NA 138 141  K 4.4 3.5  CL 102 104  CO2  28 28  GLUCOSE 107* 111*  BUN 16 19  CREATININE 0.85 0.89  CALCIUM 9.1 9.4  MG  --  1.7   GFR: Estimated Creatinine Clearance: 47.5 mL/min (by C-G formula based on SCr of 0.89 mg/dL). Liver Function Tests: No results for input(s): AST, ALT, ALKPHOS, BILITOT, PROT, ALBUMIN in the last 168 hours. No results for input(s): LIPASE, AMYLASE in the last 168 hours. No results for input(s): AMMONIA in the last 168 hours. Coagulation Profile: No results for input(s): INR, PROTIME in the last 168 hours. Cardiac Enzymes: No results for input(s): CKTOTAL, CKMB, CKMBINDEX, TROPONINI in the last 168 hours. BNP (last 3 results) No results for input(s): PROBNP in the last 8760 hours. HbA1C: No results for input(s): HGBA1C in the last 72 hours. CBG: No results for input(s): GLUCAP in the last 168 hours. Lipid Profile: No results for input(s): CHOL, HDL, LDLCALC, TRIG, CHOLHDL, LDLDIRECT in the last 72 hours. Thyroid Function Tests: Recent Labs    10/16/19 0455  TSH 2.824   Anemia Panel: No results for input(s): VITAMINB12, FOLATE, FERRITIN, TIBC, IRON, RETICCTPCT in the last 72 hours. Sepsis Labs: No results for input(s): PROCALCITON, LATICACIDVEN in the last 168 hours.  Recent Results (from the past 240 hour(s))  SARS Coronavirus 2 by RT PCR (hospital order, performed in [facility] hospital lab) Nasopharyngeal Nasopharyngeal Swab     Status: None   Collection Time: 10/15/19  3:07 PM   Specimen: Nasopharyngeal Swab  Result Value Ref Range Status   SARS Coronavirus 2 NEGATIVE NEGATIVE Final    Comment: (NOTE) SARS-CoV-2 target nucleic acids are NOT DETECTED. The SARS-CoV-2 RNA is generally detectable in upper and lower respiratory specimens during the acute phase of infection. The lowest concentration of SARS-CoV-2 viral copies this assay can detect is 250 copies / mL. A negative result does not preclude SARS-CoV-2 infection and should not be used as the sole basis for treatment or  other patient management decisions.  A negative result may occur with improper specimen collection / handling, submission of specimen other than nasopharyngeal swab, presence of viral mutation(s) within the areas targeted by this assay, and inadequate number of viral copies (<250 copies / mL). A negative result must be combined with clinical observations, patient history, and epidemiological information. Fact Sheet for Patients:   BoilerBrush.com.cy Fact Sheet for Healthcare Providers: https://pope.com/ This test is not yet approved or cleared  by the [place] FDA and has been authorized for detection and/or diagnosis of SARS-CoV-2 by FDA under an Emergency Use Authorization (EUA).  This EUA will remain in effect (meaning this test can be used) for the duration of the COVID-19 declaration under Section 564(b)(1) of the Act, 21 U.S.C. section 360bbb-3(b)(1), unless the authorization is terminated or revoked sooner. Performed at [facility], [address], [place], [place] [address]  Radiology Studies: DG Chest Portable 1 View  Result Date: 10/15/2019 CLINICAL DATA:  Cough and shortness of breath. Flu like symptoms for 4 days. EXAM: PORTABLE CHEST 1 VIEW COMPARISON:  Radiograph 11/22/2017 FINDINGS: Left-sided pacemaker remains in place. Cardiomegaly is increased from prior exam. Unchanged mediastinal contours. There is a retrocardiac hiatal hernia. Interstitial and peribronchial thickening suspicious for pulmonary edema. Small pleural effusions. No confluent airspace disease. Reverse left shoulder arthroplasty. No acute osseous abnormalities are seen. IMPRESSION: 1. Cardiomegaly and small pleural effusions. Interstitial thickening suspicious for pulmonary edema. Findings suspicious for CHF. 2. Hiatal hernia. Electronically Signed   By: Keith Rake M.D.   On: 10/15/2019 13:36        Scheduled Meds: .  apixaban  5 mg Oral BID  . citalopram  10 mg Oral Daily  . famotidine  10 mg Oral Daily  . furosemide  20 mg Intravenous BID  . magnesium oxide  400 mg Oral Daily  . metoprolol succinate  50 mg Oral Daily  . propafenone  150 mg Oral Daily  . rOPINIRole  1 mg Oral QHS   Continuous Infusions:   LOS: 1 day    Time spent: 35 minutes.     Elmarie Shiley, MD [facility]   If 7PM-7AM, please contact night-coverage [email or web address]  10/16/2019, 7:31 AM

## 2019-10-16 NOTE — Plan of Care (Signed)

## 2019-10-16 NOTE — Progress Notes (Signed)
  Echocardiogram 2D Echocardiogram has been performed.  Kaitlyn [name] 10/16/2019, 2:59 PM

## 2019-10-16 NOTE — Consult Note (Signed)
Cardiology Consultation:   Patient ID: Kaitlyn [name] MRN: [id number]; DOB: 01-19-1941  Admit date: 10/15/2019 Date of Consult: 10/16/2019  Primary Care Provider: Patient, No Pcp Per Primary Cardiologist:    Jurnee Nakayama at [facility] Primary Electrophysiologist:  None    Patient Profile:   Kaitlyn [name] is a 79 y.o. female with a hx of atrial fibrillation, status post recent stroke, ventricular septal defect, DVT, pacemaker, hypertension who is being seen today for the evaluation of congestive heart failure at the request of Dr.  Sunnie Nielsen   History of Present Illness:   Ms. [name] is a 79 year old female with a known history of paroxysmal atrial fibrillation.  She had a GI bleed last year but further work-up including EGD and colonoscopy was negative.  She was off of her Xarelto and ultimately had a stroke.  At that point she was started on Eliquis.  She was referred to EP for further evaluation regarding watchman since she is not a good candidate for long-term anticoagulation and has had a history of a GI bleed as well as a stroke.  She has a history of moderate to severe aortic stenosis by echo in the [facility] system.  She presented to [facility] last night with 3 days of progressive lower extremity swelling and increasing shortness of breath.  She denies any chest pain.  Her chest x-ray showed congestion and pleural effusions.  Her BNP was greater than 1300.  Her high-sensitivity troponin levels were very minimally elevated with a flat trend not consistent with acute coronary syndrome.  Repeat echocardiogram has been ordered.  She also has a history of paroxysmal atrial fibrillation.  She is on Eliquis, Rythmol, metoprolol.  TSH is normal    Past Medical History:  Diagnosis Date  . A-fib (HCC)   . Hypertension   . Presence of permanent cardiac pacemaker   . Stroke [facility])     Past Surgical History:  Procedure Laterality Date  . CERVICAL SPINE SURGERY    . PACEMAKER IMPLANT     . shoulder replacement Left      Home Medications:  Prior to Admission medications   Medication Sig Start Date End Date Taking? Authorizing Provider  apixaban (ELIQUIS) 5 MG TABS tablet Take 5 mg by mouth 2 (two) times daily.  07/21/19  Yes [provider]  citalopram (CELEXA) 10 MG tablet Take 10 mg by mouth daily. 08/15/19  Yes [provider]  clonazePAM (KLONOPIN) 0.5 MG tablet Take 0.5 mg by mouth at bedtime as needed for anxiety.  12/15/14  Yes [provider]  famotidine-calcium carbonate-magnesium hydroxide (PEPCID COMPLETE) 10-800-165 MG chewable tablet Chew 1 tablet by mouth daily.   Yes [provider]  Magnesium 400 MG TABS Take 400 mg by mouth daily.   Yes [provider]  metoprolol succinate (TOPROL-XL) 50 MG 24 hr tablet Take 50 mg by mouth daily. 07/21/19  Yes [provider]  Potassium 99 MG TABS Take 99 mg by mouth daily.   Yes [provider]  propafenone (RYTHMOL) 150 MG tablet Take 150 mg by mouth daily. 10/09/19  Yes [provider]  rOPINIRole (REQUIP) 0.5 MG tablet Take 1 mg by mouth at bedtime.  11/05/18  Yes [provider]    Inpatient Medications: Scheduled Meds: . apixaban  5 mg Oral BID  . citalopram  10 mg Oral Daily  . famotidine  10 mg Oral Daily  . furosemide  20 mg Intravenous BID  . magnesium oxide  400  mg Oral Daily  . metoprolol succinate  50 mg Oral Daily  . potassium chloride  40 mEq Oral Daily  . propafenone  150 mg Oral Daily  . rOPINIRole  1 mg Oral QHS   Continuous Infusions:  PRN Meds: clonazePAM  Allergies:    Allergies  Allergen Reactions  . Lisinopril Cough         Social History:   Social History   Socioeconomic History  . Marital status: Married    Spouse name: Not on file  . Number of children: Not on file  . Years of education: Not on file  . Highest education level: Not on file  Occupational History  . Not on file  Tobacco Use  .  Smoking status: Current Every Day Smoker    Types: E-cigarettes  . Smokeless tobacco: Never Used  Substance and Sexual Activity  . Alcohol use: Never  . Drug use: Never  . Sexual activity: Not on file  Other Topics Concern  . Not on file  Social History Narrative  . Not on file   Social Determinants of Health   Financial Resource Strain:   . Difficulty of Paying Living Expenses:   Food Insecurity:   . Worried About Charity fundraiser in the Last Year:   . Arboriculturist in the Last Year:   Transportation Needs:   . Film/video editor (Medical):   Marland Kitchen Lack of Transportation (Non-Medical):   Physical Activity:   . Days of Exercise per Week:   . Minutes of Exercise per Session:   Stress:   . Feeling of Stress :   Social Connections:   . Frequency of Communication with Friends and Family:   . Frequency of Social Gatherings with Friends and Family:   . Attends Religious Services:   . Active Member of Clubs or Organizations:   . Attends Archivist Meetings:   Marland Kitchen Marital Status:   Intimate Partner Violence:   . Fear of Current or Ex-Partner:   . Emotionally Abused:   Marland Kitchen Physically Abused:   . Sexually Abused:     Family History:    Family History  Family history unknown: Yes     ROS:  Please see the history of present illness.   All other ROS reviewed and negative.     Physical Exam/Data:   Vitals:   10/16/19 0105 10/16/19 0411 10/16/19 0746 10/16/19 1151  BP: (!) 149/93 (!) 152/94 (!) 152/88 (!) 142/94  Pulse: 66 64 65 65  Resp: 16 16 16 18   Temp: (!) 97.5 F (36.4 C) 97.6 F (36.4 C) (!) 97.3 F (36.3 C) 97.8 F (36.6 C)  TempSrc: Oral Oral Oral   SpO2: 98% 94% 95% 97%  Weight:      Height:        Intake/Output Summary (Last 24 hours) at 10/16/2019 1308 Last data filed at 10/16/2019 0810 Gross per 24 hour  Intake 240 ml  Output 3500 ml  Net -3260 ml   Last 3 Weights 10/16/2019 10/15/2019 10/15/2019  Weight (lbs) 148 lb 1.6 oz 152 lb 3.2  oz 140 lb  Weight (kg) 67.178 kg 69.037 kg 63.504 kg     Body mass index is 27.09 kg/m.  General:   Elderly female.  HEENT: normal Lymph: no adenopathy Neck: no JVD Endocrine:  No thryomegaly Vascular: No carotid bruits; + radiation of AS murmur .  FA pulses 2+ bilaterally without bruits  Cardiac:   RR, 3-7/6 systolic murmur  radiating to URSB and also to axilla Lungs:  Few rales , esp R bqase  Abd: soft, nontender, no hepatomegaly  Ext: no edema Musculoskeletal:  No deformities, BUE and BLE strength normal and equal Skin: warm and dry  Neuro:   Slightly weak on left side  Psych:  Normal affect   EKG:  The EKG was personally reviewed and demonstrates:   V paced.  Telemetry:  Telemetry was personally reviewed and demonstrates:  V pacing   Relevant CV Studies:   Laboratory Data:  High Sensitivity Troponin:   Recent Labs  Lab 10/15/19 1337 10/15/19 1729  TROPONINIHS 31* 29*     Chemistry Recent Labs  Lab 10/15/19 1337 10/16/19 0455  NA 138 141  K 4.4 3.5  CL 102 104  CO2 28 28  GLUCOSE 107* 111*  BUN 16 19  CREATININE 0.85 0.89  CALCIUM 9.1 9.4  GFRNONAA >60 >60  GFRAA >60 >60  ANIONGAP 8 9    No results for input(s): PROT, ALBUMIN, AST, ALT, ALKPHOS, BILITOT in the last 168 hours. Hematology Recent Labs  Lab 10/15/19 1337 10/16/19 0455  WBC 7.6 6.9  RBC 5.00 4.78  HGB 13.9 13.3  HCT 44.1 41.8  MCV 88.2 87.4  MCH 27.8 27.8  MCHC 31.5 31.8  RDW 16.0* 15.8*  PLT 265 249   BNP Recent Labs  Lab 10/15/19 1338  BNP 1,346.3*    DDimer No results for input(s): DDIMER in the last 168 hours.   Radiology/Studies:  DG Chest Portable 1 View  Result Date: 10/15/2019 CLINICAL DATA:  Cough and shortness of breath. Flu like symptoms for 4 days. EXAM: PORTABLE CHEST 1 VIEW COMPARISON:  Radiograph 11/22/2017 FINDINGS: Left-sided pacemaker remains in place. Cardiomegaly is increased from prior exam. Unchanged mediastinal contours. There is a retrocardiac hiatal  hernia. Interstitial and peribronchial thickening suspicious for pulmonary edema. Small pleural effusions. No confluent airspace disease. Reverse left shoulder arthroplasty. No acute osseous abnormalities are seen. IMPRESSION: 1. Cardiomegaly and small pleural effusions. Interstitial thickening suspicious for pulmonary edema. Findings suspicious for CHF. 2. Hiatal hernia. Electronically Signed   By: Narda Rutherford M.D.   On: 10/15/2019 13:36   { Assessment and Plan:   1.  Aortic stenosis:  Had an echocardiogram in January, 2021 which revealed moderate aortic stenosis with a mean aortic valve gradient of 22 mmHg.  We have repeated the echocardiogram here at [facility].   2.   Pacer :   Reportedly near ERI.  The pacemaker interrogation from March, 2021 reveals a 69-month battery life before ERI.  To be interrogated today .  Will need to establish 3.   Possible Aspiration: coughs frequently after swallowing .  She has had swallow evaluations in the past.  We will repeat her swallow evaluation here.  4.   Atrial fib:   Is currently V pacing .  Has had GI bleeding.   she was most recently on xarelto  And she has been started on Eliquis 5 bid as of last night.  Optimally , we would place a Watchman.  Will refer her to Afib clinic to set this up .    5.  GI bleeding: has a hx of GI bleeding and has apparently tried both eliquis and Xarelto. She has been restarted on Eliquis 5 BID as of last night.  If she rebleeds, We discuss / consider  the possibility of low dose eliquis until we can have her get her set up for a watchman .  For questions or updates, please contact [facility] Please consult [email or web address] for contact info under     Signed, [name], MD  10/16/2019 1:08 PM

## 2019-10-17 ENCOUNTER — Inpatient Hospital Stay (HOSPITAL_COMMUNITY)
Admission: EM | Disposition: A | Discharge status: Home Health | Payer: Self-pay | Source: Home / Self Care | Attending: Internal Medicine

## 2019-10-17 ENCOUNTER — Inpatient Hospital Stay (HOSPITAL_COMMUNITY): Payer: Medicare Other

## 2019-10-17 DIAGNOSIS — Z45010 Encounter for checking and testing of cardiac pacemaker pulse generator [battery]: Secondary | ICD-10-CM

## 2019-10-17 DIAGNOSIS — I5031 Acute diastolic (congestive) heart failure: Secondary | ICD-10-CM

## 2019-10-17 DIAGNOSIS — I495 Sick sinus syndrome: Secondary | ICD-10-CM

## 2019-10-17 DIAGNOSIS — I509 Heart failure, unspecified: Secondary | ICD-10-CM

## 2019-10-17 DIAGNOSIS — I5033 Acute on chronic diastolic (congestive) heart failure: Secondary | ICD-10-CM

## 2019-10-17 HISTORY — PX: PPM GENERATOR CHANGEOUT: EP1233

## 2019-10-17 LAB — BASIC METABOLIC PANEL
Anion gap: 10 (ref 5–15)
BUN: 20 mg/dL (ref 8–23)
CO2: 26 mmol/L (ref 22–32)
Calcium: 9.1 mg/dL (ref 8.9–10.3)
Chloride: 105 mmol/L (ref 98–111)
Creatinine, Ser: 0.95 mg/dL (ref 0.44–1.00)
GFR calc Af Amer: >60 mL/min (ref >60)
GFR calc non Af Amer: 58 mL/min — ABNORMAL LOW (ref >60)
Glucose, Bld: 110 mg/dL — ABNORMAL HIGH (ref 70–99)
Potassium: 3.7 mmol/L (ref 3.5–5.1)
Sodium: 141 mmol/L (ref 135–145)

## 2019-10-17 LAB — VITAMIN B12: Vitamin B-12: 840 pg/mL (ref 180–914)

## 2019-10-17 LAB — CBC
HCT: 43.5 % (ref 36.0–46.0)
Hemoglobin: 13.7 g/dL (ref 12.0–15.0)
MCH: 27.6 pg (ref 26.0–34.0)
MCHC: 31.5 g/dL (ref 30.0–36.0)
MCV: 87.5 fL (ref 80.0–100.0)
Platelets: 269 10*3/uL (ref 150–400)
RBC: 4.97 MIL/uL (ref 3.87–5.11)
RDW: 15.9 % — ABNORMAL HIGH (ref 11.5–15.5)
WBC: 8.6 10*3/uL (ref 4.0–10.5)
nRBC: 0 % (ref 0.0–0.2)

## 2019-10-17 LAB — SURGICAL PCR SCREEN
MRSA, PCR: NEGATIVE
Staphylococcus aureus: NEGATIVE

## 2019-10-17 LAB — AMMONIA: Ammonia: 93 umol/L — ABNORMAL HIGH (ref 9–35)

## 2019-10-17 LAB — HEPATIC FUNCTION PANEL
ALT: 45 U/L — ABNORMAL HIGH (ref 0–44)
AST: 36 U/L (ref 15–41)
Albumin: 3.2 g/dL — ABNORMAL LOW (ref 3.5–5.0)
Alkaline Phosphatase: 95 U/L (ref 38–126)
Bilirubin, Direct: 0.4 mg/dL — ABNORMAL HIGH (ref 0.0–0.2)
Indirect Bilirubin: 1.8 mg/dL — ABNORMAL HIGH (ref 0.3–0.9)
Total Bilirubin: 2.2 mg/dL — ABNORMAL HIGH (ref 0.3–1.2)
Total Protein: 5.7 g/dL — ABNORMAL LOW (ref 6.5–8.1)

## 2019-10-17 SURGERY — PPM GENERATOR CHANGEOUT

## 2019-10-17 MED ORDER — CEFAZOLIN SODIUM-DEXTROSE 2-4 GM/100ML-% IV SOLN
2.0000 g | INTRAVENOUS | Status: AC
  Administered 2019-10-17: 2 g via INTRAVENOUS

## 2019-10-17 MED ORDER — SODIUM CHLORIDE 0.9 % IV SOLN
INTRAVENOUS | Status: AC
  Filled 2019-10-17: qty 2

## 2019-10-17 MED ORDER — HEPARIN (PORCINE) IN NACL 1000-0.9 UT/500ML-% IV SOLN
INTRAVENOUS | Status: AC
  Filled 2019-10-17: qty 500

## 2019-10-17 MED ORDER — LABETALOL HCL 5 MG/ML IV SOLN
INTRAVENOUS | Status: AC
  Filled 2019-10-17: qty 4

## 2019-10-17 MED ORDER — LIDOCAINE HCL 1 % IJ SOLN
INTRAMUSCULAR | Status: AC
  Filled 2019-10-17: qty 60

## 2019-10-17 MED ORDER — LIDOCAINE HCL (PF) 1 % IJ SOLN
INTRAMUSCULAR | Status: DC | PRN
  Administered 2019-10-17: 60 mL

## 2019-10-17 MED ORDER — SODIUM CHLORIDE 0.9 % IV SOLN
80.0000 mg | INTRAVENOUS | Status: AC
  Administered 2019-10-17: 80 mg

## 2019-10-17 MED ORDER — LACTULOSE 10 GM/15ML PO SOLN: 20.0000 g | Freq: Two times a day (BID) | ORAL | Status: DC

## 2019-10-17 MED ORDER — CHLORHEXIDINE GLUCONATE 4 % EX LIQD
60.0000 mL | Freq: Once | CUTANEOUS | Status: AC
  Administered 2019-10-17: 4 via TOPICAL

## 2019-10-17 MED ORDER — LACTULOSE 10 GM/15ML PO SOLN
20.0000 g | Freq: Three times a day (TID) | ORAL | Status: DC
  Administered 2019-10-17 – 2019-10-18 (×2): 20 g via ORAL
  Filled 2019-10-17 (×3): qty 30

## 2019-10-17 MED ORDER — APIXABAN 5 MG PO TABS
5.0000 mg | ORAL_TABLET | Freq: Two times a day (BID) | ORAL | Status: DC
  Administered 2019-10-18 – 2019-10-20 (×5): 5 mg via ORAL
  Filled 2019-10-17 (×5): qty 1

## 2019-10-17 MED ORDER — LABETALOL HCL 5 MG/ML IV SOLN
INTRAVENOUS | Status: DC | PRN
  Administered 2019-10-17: 20 mg via INTRAVENOUS

## 2019-10-17 MED ORDER — SODIUM CHLORIDE 0.9 % IV SOLN: INTRAVENOUS | Status: DC

## 2019-10-17 MED ORDER — CEFAZOLIN SODIUM-DEXTROSE 2-4 GM/100ML-% IV SOLN
INTRAVENOUS | Status: AC
  Filled 2019-10-17: qty 100

## 2019-10-17 MED ORDER — APIXABAN 5 MG PO TABS: 5.0000 mg | ORAL_TABLET | Freq: Two times a day (BID) | ORAL | Status: DC

## 2019-10-17 SURGICAL SUPPLY — 6 items
CABLE SURGICAL S-101-97-12 (CABLE) ×2 IMPLANT
HEMOSTAT SURGICEL 2X4 FIBR (HEMOSTASIS) ×2 IMPLANT
IPG PACE AZUR XT DR MRI W1DR01 (Pacemaker) ×1 IMPLANT
PACE AZURE XT DR MRI W1DR01 (Pacemaker) ×2 IMPLANT
PAD PRO RADIOLUCENT 2001M-C (PAD) ×2 IMPLANT
TRAY PACEMAKER INSERTION (PACKS) ×2 IMPLANT

## 2019-10-17 NOTE — Progress Notes (Signed)
Have contacted [employer]  If she needs MRI for evaluation of her labile mental status she can have it

## 2019-10-17 NOTE — Consult Note (Addendum)
Cardiology Consultation:   Patient ID: Kaitlyn [name] MRN: [id number]; DOB: Nov 24, 1940  Admit date: 10/15/2019 Date of Consult: 10/17/2019  Primary Care Provider: Patient, No Pcp Per Primary Cardiologist: Dr. Novella Rob, ([facility] in [place]) Primary Electrophysiologist:     Patient Profile:   Kaitlyn [name] is a 79 y.o. female with a hx of CVA (while off a/c 2/2 GIB), AFib, remote DVT, HTN, SSSx w/PPM, GIB, VHD w/ AS, prior cardiology mentions VSD, who is being seen today for the evaluation of PPM at [facility] at the request of Dr. Acie Fredrickson.  History of Present Illness:   Ms. [name] was admitted to [facility] 5/19 with increasing DOE, SOB, edema, noted with CHF, mild flat abn HS trop not c/w ACS. Cardiology is managing, noted that her PPM has reached [facility] and EP is asked to the case, the pt requesting to establish cardiac and EP care here.  She has diuresed cumulatively -3464ml LABS K+ 3.7 (replacement ordered) Mag yesterday 1.7 (PO mag ox, home and here) BUN/Creat 20/0.95 (holding stable with diuresis) WBC 8.6 H.H 13/43 Plts 269  She is feeling better today then admission, "much better", no CP, no rest SOB this AM.  No symptoms if illness, she is afebrile     Device information MDT dual chamber PPM implanted 04/28/2008 Battery reached [facility] 09/14/19 >> VVI 65 No A lead data currently RV lead impedance and threshold OK, pacer dependent.  By look of EKG I suspect she has sinus underlying currently    Past Medical History:  Diagnosis Date  . A-fib ([place])   . Hypertension   . Presence of permanent cardiac pacemaker   . Stroke [facility])     Past Surgical History:  Procedure Laterality Date  . CERVICAL SPINE SURGERY    . PACEMAKER IMPLANT    . shoulder replacement Left      Home Medications:  Prior to Admission medications   Medication Sig Start Date End Date Taking? Authorizing Provider  apixaban (ELIQUIS) 5 MG TABS tablet Take 5 mg by mouth 2 (two) times daily.  07/21/19  Yes [provider]  citalopram (CELEXA) 10 MG tablet Take 10 mg by mouth daily. 08/15/19  Yes [provider]  clonazePAM (KLONOPIN) 0.5 MG tablet Take 0.5 mg by mouth at bedtime as needed for anxiety.  12/15/14  Yes [provider]  famotidine-calcium carbonate-magnesium hydroxide (PEPCID COMPLETE) 10-800-165 MG chewable tablet Chew 1 tablet by mouth daily.   Yes [provider]  Magnesium 400 MG TABS Take 400 mg by mouth daily.   Yes [provider]  metoprolol succinate (TOPROL-XL) 50 MG 24 hr tablet Take 50 mg by mouth daily. 07/21/19  Yes [provider]  Potassium 99 MG TABS Take 99 mg by mouth daily.   Yes [provider]  propafenone (RYTHMOL) 150 MG tablet Take 150 mg by mouth daily. 10/09/19  Yes [provider]  rOPINIRole (REQUIP) 0.5 MG tablet Take 1 mg by mouth at bedtime.  11/05/18  Yes [provider]    Inpatient Medications: Scheduled Meds: . citalopram  10 mg Oral Daily  . famotidine  10 mg Oral Daily  . furosemide  20 mg Oral Daily  . magnesium oxide  400 mg Oral Daily  . metoprolol succinate  50 mg Oral Daily  . potassium chloride  20 mEq Oral Daily  . propafenone  150 mg Oral Daily  . rOPINIRole  1 mg Oral QHS   Continuous Infusions:  PRN Meds: clonazePAM  Allergies:  Allergies  Allergen Reactions  . Lisinopril Cough         Social History:   Social History   Socioeconomic History  . Marital status: Married    Spouse name: Not on file  . Number of children: Not on file  . Years of education: Not on file  . Highest education level: Not on file  Occupational History  . Not on file  Tobacco Use  . Smoking status: Current Every Day Smoker    Types: E-cigarettes  . Smokeless tobacco: Never Used  Substance and Sexual Activity  . Alcohol use: Never  . Drug use: Never  . Sexual activity: Not on file  Other Topics Concern  . Not on file  Social History Narrative  . Not on file    Social Determinants of Health   Financial Resource Strain:   . Difficulty of Paying Living Expenses:   Food Insecurity:   . Worried About Programme researcher, broadcasting/film/video in the Last Year:   . Barista in the Last Year:   Transportation Needs:   . Freight forwarder (Medical):   Marland Kitchen Lack of Transportation (Non-Medical):   Physical Activity:   . Days of Exercise per Week:   . Minutes of Exercise per Session:   Stress:   . Feeling of Stress :   Social Connections:   . Frequency of Communication with Friends and Family:   . Frequency of Social Gatherings with Friends and Family:   . Attends Religious Services:   . Active Member of Clubs or Organizations:   . Attends Banker Meetings:   Marland Kitchen Marital Status:   Intimate Partner Violence:   . Fear of Current or Ex-Partner:   . Emotionally Abused:   Marland Kitchen Physically Abused:   . Sexually Abused:     Family History:   Family History  Family history unknown: Yes     ROS:  Please see the history of present illness.  All other ROS reviewed and negative.     Physical Exam/Data:   Vitals:   10/16/19 1151 10/16/19 2029 10/17/19 0059 10/17/19 0334  BP: (!) 142/94 (!) 139/93 (!) 154/98 (!) 158/103  Pulse: 65 65 65 66  Resp: 18 18 18 18   Temp: 97.8 F (36.6 C) 98 F (36.7 C) 98 F (36.7 C) (!) 97.5 F (36.4 C)  TempSrc:  Oral Oral Oral  SpO2: 97% 96% 98% 94%  Weight:   65.7 kg   Height:        Intake/Output Summary (Last 24 hours) at 10/17/2019 0727 Last data filed at 10/17/2019 0111 Gross per 24 hour  Intake 600 ml  Output 550 ml  Net 50 ml   Last 3 Weights 10/17/2019 10/16/2019 10/15/2019  Weight (lbs) 144 lb 14.4 oz 148 lb 1.6 oz 152 lb 3.2 oz  Weight (kg) 65.726 kg 67.178 kg 69.037 kg     Body mass index is 26.5 kg/m.  General:  Well nourished, well developed, in no acute distress HEENT: normal Lymph: no adenopathy Neck: no JVD Endocrine:  No thryomegaly Vascular: No carotid bruits  Cardiac:  RRR; loud,  holosystolic murmur Lungs:  CTA b/l, no wheezing, rhonchi or rales  Abd: soft, nontender Ext: no edema Musculoskeletal:  No deformities, age appropriate atrophy Skin: warm and dry  Neuro:  Some weakness on L (post stroke earlier this year) Psych:  Normal affect   EKG:  The EKG was personally reviewed and demonstrates:   V paced (looks like  retrograde P waves)  Telemetry:  Telemetry was personally reviewed and demonstrates:   V paced    Relevant CV Studies:  10/16/2019: TTE IMPRESSIONS 1. Left ventricular ejection fraction, by estimation, is 50%. The left  ventricle has mildly decreased function. The left ventricle demonstrates  global hypokinesis. There is mild left ventricular hypertrophy. Left  ventricular diastolic parameters are  indeterminate. Elevated left ventricular end-diastolic pressure.  2. Right ventricular systolic function is normal. The right ventricular  size is normal. There is mildly elevated pulmonary artery systolic  pressure. The estimated right ventricular systolic pressure is 40.2 mmHg.  3. Left atrial size was moderately dilated.  4. Right atrial size was mildly dilated.  5. The mitral valve is abnormal. Mild to moderate mitral valve  regurgitation. No evidence of mitral stenosis.  6. Tricuspid valve regurgitation is moderate.  7. The aortic valve is severely calcified with severely reduced leaflet  motion. The dimensionless index is 0.17, however LVOT TVI may be  underestimated. 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 The aortic valve is  abnormal. Aortic valve regurgitation is not visualized. Aortic valve mean  gradient measures 21.0 mmHg.  8. The inferior vena cava is dilated in size with <50% respiratory  variability, suggesting right atrial pressure of 15 mmHg.     Laboratory Data:  High Sensitivity Troponin:   Recent Labs  Lab 10/15/19 1337 10/15/19 1729  TROPONINIHS 31* 29*      Chemistry Recent Labs  Lab 10/15/19 1337 10/16/19 0455 10/17/19 0504  NA 138 141 141  K 4.4 3.5 3.7  CL 102 104 105  CO2 28 28 26   GLUCOSE 107* 111* 110*  BUN 16 19 20   CREATININE 0.85 0.89 0.95  CALCIUM 9.1 9.4 9.1  GFRNONAA >60 >60 58*  GFRAA >60 >60 >60  ANIONGAP 8 9 10     No results for input(s): PROT, ALBUMIN, AST, ALT, ALKPHOS, BILITOT in the last 168 hours. Hematology Recent Labs  Lab 10/15/19 1337 10/16/19 0455 10/17/19 0504  WBC 7.6 6.9 8.6  RBC 5.00 4.78 4.97  HGB 13.9 13.3 13.7  HCT 44.1 41.8 43.5  MCV 88.2 87.4 87.5  MCH 27.8 27.8 27.6  MCHC 31.5 31.8 31.5  RDW 16.0* 15.8* 15.9*  PLT 265 249 269   BNP Recent Labs  Lab 10/15/19 1338  BNP 1,346.3*    DDimer No results for input(s): DDIMER in the last 168 hours.   Radiology/Studies:   DG Chest Portable 1 View Result Date: 10/15/2019 CLINICAL DATA:  Cough and shortness of breath. Flu like symptoms for 4 days. EXAM: PORTABLE CHEST 1 VIEW COMPARISON:  Radiograph 11/22/2017 FINDINGS: Left-sided pacemaker remains in place. Cardiomegaly is increased from prior exam. Unchanged mediastinal contours. There is a retrocardiac hiatal hernia. Interstitial and peribronchial thickening suspicious for pulmonary edema. Small pleural effusions. No confluent airspace disease. Reverse left shoulder arthroplasty. No acute osseous abnormalities are seen. IMPRESSION: 1. Cardiomegaly and small pleural effusions. Interstitial thickening suspicious for pulmonary edema. Findings suspicious for CHF. 2. Hiatal hernia. Electronically Signed   By: 10/17/19 M.D.   On: 10/15/2019 13:36     Assessment and Plan:   1. PPM at [facility]     She is pacer dependent     Is suspect she has SR underlying, perhaps VVI pacing contributed to her CHF (with her AS and mild CM)  Plan gen change today  I have discussed the pacemaker generator change procedure with the patient, potential risks and benefits.  She  is happy that we are planning it  and agreeable to proceed. She had opportunity to meed Dr. Graciela Husbands last evening   2. Paroxysmal Afib     CHA2DS2CVasc is 6, on Eliquis, held this AM for procedure     On Rythmol, paced rhythm     Pending pocket post procedure for eliquis resumption, though given stroke history off a/c, likely this evening        3. CHF exacerbation 4. VHD w/ AS (mod-severe)     C/w primary cardiology team diuresis and lyte replacement   5. HTN     She is due for her metoprolol     will defer to primary cards     For questions or updates, please contact [facility] Please consult [email or web address] for contact info under     Signed, Sheilah Pigeon, PA-C  10/17/2019 7:27 AM  Sick sinus syndrome  Atrial fibrillation-persistent  Pacemaker-[employer] at [facility] with reversion  DVT  Stroke  Confusion  GI bleed previously prompting discontinuation of anticoagulation complicated by stroke   The patient has felt terrible for some time.  Her mental status today is quite different from the brief interaction yesterday afternoon.  She is picking with her hands with some perseverating.  She is alert and oriented x2.  Her speech is somewhat sluggish.  CT scan is negative.  The hemodynamics of her ventricular pacing have some potential contribution to her situation and I have reviewed with her husband and her to the degree that she can that the procedure for replacing her pacemaker generator is low risk and the benefits are certainly positive.   I wonder whether there has been an intercurrent stroke off anticoagulation.  This is happened previously.  We will resume anticoagulation.  MRI may be possible following device generator replacement.  Issue has been broached with the family previously as to whether he might be a candidate for WATCHMAN.  We will review this with Drs. Cooper and/or Allred on Monday.  Could consider referral to [facility].

## 2019-10-17 NOTE — Progress Notes (Signed)
With hx of stroke with prior holding of anticoagulation will resume Apixoban  In am and have placed pressure pal to help with hemostasis

## 2019-10-17 NOTE — Progress Notes (Signed)
PROGRESS NOTE    Kaitlyn [name]  MOQ:[id number] DOB: April 21, 1941 DOA: 10/15/2019 PCP: Patient, No Pcp Per   Brief Narrative: 79 year old with past medical history significant for A. fib, status post pacemaker, recent stroke January 2021 10 in dysarthria, grade 2 diastolic dysfunction, severe aortic stenosis, who presents complaining of worsening shortness of breath over the last 3 days prior to admission and lower extremity edema.  Evaluation in the ED chest x-ray showed congestion and pleural effusion, BNP 1300, troponin 31/29.  Covid test negative, EKG shows paced rhythm.  Patient has been admitted for heart failure exacerbation.    Assessment & Plan:   Principal Problem:   Acute exacerbation of CHF (congestive heart failure) (HCC) Active Problems:   PAF (paroxysmal atrial fibrillation) (HCC)   Essential hypertension   History of ischemic stroke   Acute CHF (congestive heart failure) (HCC)  1-Acute on chronic diastolic heart failure exacerbation, severe aortic stenosis: -Patient presented with worsening shortness of breath, chest x-ray with pulmonary edema, BNP elevated at 1300. -She has received 3 doses of IV lasix. Discussed with cardiology, plan to transition to oral lasix.  -Weight; 152---148--144 -Output 3.9 L. -improved.   2-Paroxysmal A. fib:  Status post pacemaker, cardiology will interrogate pacer and evaluate battery. Plan for battery exchange today.   Continue with apixaban and metoprolol. On propafenone.  3-Recent stroke Speech difficulties Continue with apixaban  4-History of anxiety or depression: Continue with citalopram 5-History of GI  bleed; monitor on Eliquis.  6-Aortic stenosis, moderate to severe ECHO from [facility]. ECHO repeated here. Cardiology consulted.  7-Acute hepatic encephalopathy;  More confuse. Per husband patient has been confuse since the stroke.  Will check ammonia ; elevated at 90  Start lactulose.  Check B 12 level.  Repeated CT head;  Mild atrophy. Prior focal infarct involving a portion of the posterior right frontal lobe. Mild periventricular small vessel disease. No acute infarct. No mass or hemorrhage.  7-Elevation ammonia; check LFT, [place] Estimated body mass index is 26.5 kg/m as calculated from the following:   Height as of this encounter: 5\' 2"  (1.575 m).   Weight as of this encounter: 65.7 kg.   DVT prophylaxis: Apixaban Code Status: Full code Family Communication: care discussed with patient. Husband updated over phone Disposition Plan:  Status is: Inpatient  Remains inpatient appropriate because:Ongoing diagnostic testing needed not appropriate for outpatient work up   Dispo: The patient is from: Home              Anticipated d/c is to: Home              Anticipated d/c date is: 1 day              Patient currently is not medically stable to d/c.     Consultants:   Cardiology   Procedures:   None  Antimicrobials:  None  Subjective: Breathing well, she appears more confuse today. Not oriented to place,  Objective: Vitals:   10/16/19 2029 10/17/19 0059 10/17/19 0334 10/17/19 0727  BP: (!) 139/93 (!) 154/98 (!) 158/103 (!) 161/105  Pulse: 65 65 66 65  Resp: 18 18 18 18   Temp: 98 F (36.7 C) 98 F (36.7 C) (!) 97.5 F (36.4 C) 98 F (36.7 C)  TempSrc: Oral Oral Oral Oral  SpO2: 96% 98% 94% 98%  Weight:  65.7 kg    Height:        Intake/Output Summary (Last 24 hours) at 10/17/2019 Last data filed at  10/17/2019 0111 Gross per 24 hour  Intake 360 ml  Output 550 ml  Net -190 ml   Filed Weights   10/15/19 1827 10/16/19 0101 10/17/19 0059  Weight: 69 kg 67.2 kg 65.7 kg    Examination:  General exam: NAD Respiratory system: CTA Cardiovascular system: S 1, S 2 RRR Gastrointestinal system: BS present, soft, nt Central nervous system: alert, confuse Extremities: symmetric power Skin: no rashes   Data Reviewed: I have personally reviewed following labs and imaging  studies  CBC: Recent Labs  Lab 10/15/19 1337 10/16/19 0455 10/17/19 0504  WBC 7.6 6.9 8.6  NEUTROABS 5.6 4.2  --   HGB 13.9 13.3 13.7  HCT 44.1 41.8 43.5  MCV 88.2 87.4 87.5  PLT 265 249 269   Basic Metabolic Panel: Recent Labs  Lab 10/15/19 1337 10/16/19 0455 10/17/19 0504  NA 138 141 141  K 4.4 3.5 3.7  CL 102 104 105  CO2 28 28 26   GLUCOSE 107* 111* 110*  BUN 16 19 20   CREATININE 0.85 0.89 0.95  CALCIUM 9.1 9.4 9.1  MG  --  1.7  --    GFR: Estimated Creatinine Clearance: 44.1 mL/min (by C-G formula based on SCr of 0.95 mg/dL). Liver Function Tests: No results for input(s): AST, ALT, ALKPHOS, BILITOT, PROT, ALBUMIN in the last 168 hours. No results for input(s): LIPASE, AMYLASE in the last 168 hours. No results for input(s): AMMONIA in the last 168 hours. Coagulation Profile: No results for input(s): INR, PROTIME in the last 168 hours. Cardiac Enzymes: No results for input(s): CKTOTAL, CKMB, CKMBINDEX, TROPONINI in the last 168 hours. BNP (last 3 results) No results for input(s): PROBNP in the last 8760 hours. HbA1C: No results for input(s): HGBA1C in the last 72 hours. CBG: No results for input(s): GLUCAP in the last 168 hours. Lipid Profile: No results for input(s): CHOL, HDL, LDLCALC, TRIG, CHOLHDL, LDLDIRECT in the last 72 hours. Thyroid Function Tests: Recent Labs    10/16/19 0455  TSH 2.824   Anemia Panel: No results for input(s): VITAMINB12, FOLATE, FERRITIN, TIBC, IRON, RETICCTPCT in the last 72 hours. Sepsis Labs: No results for input(s): PROCALCITON, LATICACIDVEN in the last 168 hours.  Recent Results (from the past 240 hour(s))  SARS Coronavirus 2 by RT PCR (hospital order, [facility]) Nasopharyngeal Nasopharyngeal Swab     Status: None   Collection Time: 10/15/19  3:07 PM   Specimen: Nasopharyngeal Swab  Result Value Ref Range Status   SARS Coronavirus 2 NEGATIVE NEGATIVE Final    Comment: (NOTE) SARS-CoV-2  target nucleic acids are NOT DETECTED. The SARS-CoV-2 RNA is generally detectable in upper and lower respiratory specimens during the acute phase of infection. The lowest concentration of SARS-CoV-2 viral copies this assay can detect is 250 copies / mL. A negative result does not preclude SARS-CoV-2 infection and should not be used as the sole basis for treatment or other patient management decisions.  A negative result may occur with improper specimen collection / handling, submission of specimen other than nasopharyngeal swab, presence of viral mutation(s) within the areas targeted by this assay, and inadequate number of viral copies (<250 copies / mL). A negative result must be combined with clinical observations, patient history, and epidemiological information. Fact Sheet for Patients:   BoilerBrush.com.cyhttps://www.fda.gov/media/136312/download Fact Sheet for Healthcare Providers: https://pope.com/https://www.fda.gov/media/136313/download This test is not yet approved or cleared  by the [place] FDA and has been authorized for detection and/or diagnosis of SARS-CoV-2 by FDA  under an Emergency Use Authorization (EUA).  This EUA will remain in effect (meaning this test can be used) for the duration of the COVID-19 declaration under Section 564(b)(1) of the Act, 21 U.S.C. section 360bbb-3(b)(1), unless the authorization is terminated or revoked sooner. Performed at [facility], [address] [place], [place] [address]          Radiology Studies: DG Chest Portable 1 View  Result Date: 10/15/2019 CLINICAL DATA:  Cough and shortness of breath. Flu like symptoms for 4 days. EXAM: PORTABLE CHEST 1 VIEW COMPARISON:  Radiograph 11/22/2017 FINDINGS: Left-sided pacemaker remains in place. Cardiomegaly is increased from prior exam. Unchanged mediastinal contours. There is a retrocardiac hiatal hernia. Interstitial and peribronchial thickening suspicious for pulmonary edema. Small pleural effusions. No  confluent airspace disease. Reverse left shoulder arthroplasty. No acute osseous abnormalities are seen. IMPRESSION: 1. Cardiomegaly and small pleural effusions. Interstitial thickening suspicious for pulmonary edema. Findings suspicious for CHF. 2. Hiatal hernia. Electronically Signed   By: Keith Rake M.D.   On: 10/15/2019 13:36   ECHOCARDIOGRAM COMPLETE  Result Date: 10/16/2019    ECHOCARDIOGRAM REPORT   Patient Name:   Kaitlyn [name] Date of Exam: 10/16/2019 Medical Rec #:  [id number]     Height:       62.0 in Accession #:    [id number]    Weight:       148.1 lb Date of Birth:  07-12-1940     BSA:          1.683 m Patient Age:    79 years      BP:           152/88 mmHg Patient Gender: F             HR:           65 bpm. Exam Location:  Inpatient Procedure: 2D Echo, Cardiac Doppler and Color Doppler Indications:    I50.23 Acute on chronic systolic (congestive) heart failure  History:        Patient has no prior history of Echocardiogram examinations.                 Pacemaker, Stroke, Arrythmias:Atrial Fibrillation; Risk                 Factors:Current Smoker and Hypertension.  Sonographer:    Jonelle Sidle Dance Referring Phys: [place]  1. Left ventricular ejection fraction, by estimation, is 50%. The left ventricle has mildly decreased function. The left ventricle demonstrates global hypokinesis. There is mild left ventricular hypertrophy. Left ventricular diastolic parameters are indeterminate. Elevated left ventricular end-diastolic pressure.  2. Right ventricular systolic function is normal. The right ventricular size is normal. There is mildly elevated pulmonary artery systolic pressure. The estimated right ventricular systolic pressure is 74.0 mmHg.  3. Left atrial size was moderately dilated.  4. Right atrial size was mildly dilated.  5. The mitral valve is abnormal. Mild to moderate mitral valve regurgitation. No evidence of mitral stenosis.  6. Tricuspid valve regurgitation  is moderate.  7. The aortic valve is severely calcified with severely reduced leaflet motion. The dimensionless index is 0.17, however LVOT TVI may be underestimated. Overall findings suggest at least moderate-severe aortic valve stenosis. The aortic valve is abnormal. Aortic valve regurgitation is not visualized. Aortic valve mean gradient measures 21.0 mmHg.  8. The inferior vena cava is dilated in size with <50% respiratory variability, suggesting right atrial pressure of 15 mmHg. FINDINGS  Left Ventricle: Left ventricular ejection fraction, by estimation, is 50%. The left ventricle has mildly decreased function. The left ventricle demonstrates global hypokinesis. The left ventricular internal cavity size was normal in size. There is mild left ventricular hypertrophy. Left ventricular diastolic parameters are indeterminate. Elevated left ventricular end-diastolic pressure. Right Ventricle: The right ventricular size is normal. No increase in right ventricular wall thickness. Right ventricular systolic function is normal. There is mildly elevated pulmonary artery systolic pressure. The tricuspid regurgitant velocity is 2.51  m/s, and with an assumed right atrial pressure of 15 mmHg, the estimated right ventricular systolic pressure is 40.2 mmHg. Left Atrium: Left atrial size was moderately dilated. Right Atrium: Right atrial size was mildly dilated. Pericardium: There is no evidence of pericardial effusion. Mitral Valve: The mitral valve is abnormal. Normal mobility of the mitral valve leaflets. Moderate mitral annular calcification. Mild to moderate mitral valve regurgitation. No evidence of mitral valve stenosis. Tricuspid Valve: The tricuspid valve is normal in structure. Tricuspid valve regurgitation is moderate . No evidence of tricuspid stenosis. Aortic Valve: The aortic valve is severely calcified with severely reduced leaflet motion. The dimensionless index is 0.17, however LVOT TVI may be underestimated.  Overall findings suggest at least moderate-severe aortic valve stenosis. The aortic valve is abnormal. Aortic valve regurgitation is not visualized. Aortic valve mean gradient measures 21.0 mmHg. Aortic valve peak gradient measures 31.6 mmHg. Aortic valve area, by VTI measures 0.48 cm. Pulmonic Valve: The pulmonic valve was normal in structure. Pulmonic valve regurgitation is trivial. No evidence of pulmonic stenosis. Aorta: The aortic root is normal in size and structure and the ascending aorta was not well visualized. Venous: The inferior vena cava is dilated in size with less than 50% respiratory variability, suggesting right atrial pressure of 15 mmHg. IAS/Shunts: No atrial level shunt detected by color flow Doppler. Additional Comments: A pacer wire is visualized in the right atrium and right ventricle.  LEFT VENTRICLE PLAX 2D LVIDd:         3.80 cm  Diastology LVIDs:         3.00 cm  LV e' lateral:   4.33 cm/s LV PW:         1.40 cm  LV E/e' lateral: 20.7 LV IVS:        1.00 cm  LV e' medial:    2.87 cm/s LVOT diam:     1.90 cm  LV E/e' medial:  31.3 LV SV:         31 LV SV Index:   19 LVOT Area:     2.84 cm  RIGHT VENTRICLE            IVC RV Basal diam:  3.00 cm    IVC diam: 2.60 cm RV Mid diam:    2.00 cm RV S prime:     7.09 cm/s TAPSE (M-mode): 1.4 cm LEFT ATRIUM             Index LA diam:        4.60 cm 2.73 cm/m LA Vol (A2C):   74.3 ml 44.16 ml/m LA Vol (A4C):   53.1 ml 31.56 ml/m LA Biplane Vol: 64.3 ml 38.22 ml/m  AORTIC VALVE AV Area (Vmax):    0.51 cm AV Area (Vmean):   0.45 cm AV Area (VTI):     0.48 cm AV Vmax:           281.00 cm/s AV Vmean:          203.500 cm/s AV  VTI:            0.644 m AV Peak Grad:      31.6 mmHg AV Mean Grad:      21.0 mmHg LVOT Vmax:         51.00 cm/s LVOT Vmean:        32.400 cm/s LVOT VTI:          0.110 m LVOT/AV VTI ratio: 0.17  AORTA Ao Root diam: 3.20 cm Ao Asc diam:  3.70 cm MITRAL VALVE               TRICUSPID VALVE MV Area (PHT): 1.83 cm    TR Peak grad:    25.2 mmHg MV Decel Time: 414 msec    TR Vmax:        251.00 cm/s MV E velocity: 89.75 cm/s                            SHUNTS                            Systemic VTI:  0.11 m                            Systemic Diam: 1.90 cm Weston Brass MD Electronically signed by Weston Brass MD Signature Date/Time: 10/16/2019/10:40:43 PM    Final         Scheduled Meds: . citalopram  10 mg Oral Daily  . famotidine  10 mg Oral Daily  . furosemide  20 mg Oral Daily  . magnesium oxide  400 mg Oral Daily  . metoprolol succinate  50 mg Oral Daily  . potassium chloride  20 mEq Oral Daily  . propafenone  150 mg Oral Daily  . rOPINIRole  1 mg Oral QHS   Continuous Infusions:   LOS: 2 days    Time spent: 35 minutes.     Alba Cory, MD [facility]   If 7PM-7AM, please contact night-coverage [email or web address]  10/17/2019, 9:46 AM

## 2019-10-17 NOTE — Progress Notes (Signed)
SLP Cancellation Note  Patient Details Name: Kaitlyn [name] MRN: [id number] DOB: [birth year]   Cancelled treatment:       Reason Eval/Treat Not Completed: Medical issues which prohibited therapy(Pt is currently NPO for procedure at 1500. SLP will follow up on subsequent date.)  Falon Flinchum I. Vear Clock, MS, CCC-SLP Acute Rehabilitation Services Office number [phone] Pager [phone]  Scheryl Marten 10/17/2019, 9:26 AM

## 2019-10-17 NOTE — TOC Initial Note (Addendum)
Transition of Care [facility]) - Initial/Assessment Note    Patient Details  Name: Kaitlyn [name] MRN: [id number] Date of Birth: 02/12/1941  Transition of [facility]) CM/SW Contact:    Zenon Mayo, RN Phone Number: 10/17/2019, 2:08 PM  Clinical Narrative:                 From home with spouse, NCM offered choice for [facility] for CHF, spouse states they also need HHST and HHPT.  He chose [facility], referral given to [facility] , he states he can take referral. Soc will begin 24 to 48hrs post dc.  Spouse states her PCP will be Agustina Caroli at [facility], will call to make apt,. Her apt is not til 6/17.  NCM asked Dr. Rockne Menghini to sign off on orders if needed before here apt with Dr. Mitchel Honour.   Expected Discharge Plan: [place] Barriers to Discharge: Continued Medical Work up   Patient Goals and CMS Choice   CMS Medicare.gov Compare Post Acute Care list provided to:: Patient Represenative (must comment) Choice offered to / list presented to : Spouse  Expected Discharge Plan and Services Expected Discharge Plan: [place]   Discharge Planning Services: CM Consult Post Acute Care Choice: [place] arrangements for the past 2 months: Single Family Home                   DME Agency: NA       HH Arranged: RN, PT, Speech Therapy HH Agency: [facility] Date [facility] Agency Contacted: 10/17/19 Time HH Agency Contacted: 43 Representative spoke with at [facility]: Tommi Rumps  Prior Living Arrangements/Services Living arrangements for the past 2 months: [place] with:: Spouse Patient language and need for interpreter reviewed:: Yes Do you feel safe going back to the place where you live?: Yes      Need for Family Participation in Patient Care: Yes (Comment) Care giver support system in place?: Yes (comment)   Criminal Activity/Legal Involvement Pertinent to Current Situation/Hospitalization: No - Comment as needed  Activities  of Daily Living Home Assistive Devices/Equipment: Eyeglasses, Shower chair without back ADL Screening (condition at time of admission) Patient's cognitive ability adequate to safely complete daily activities?: Yes Is the patient deaf or have difficulty hearing?: No Does the patient have difficulty seeing, even when wearing glasses/contacts?: No Does the patient have difficulty concentrating, remembering, or making decisions?: No Patient able to express need for assistance with ADLs?: No Does the patient have difficulty dressing or bathing?: No Independently performs ADLs?: Yes (appropriate for developmental age) Does the patient have difficulty walking or climbing stairs?: No Weakness of Legs: None Weakness of Arms/Hands: None  Permission Sought/Granted                  Emotional Assessment Appearance:: Appears stated age Attitude/Demeanor/Rapport: Engaged Affect (typically observed): Appropriate Orientation: : Oriented to Self, Oriented to  Time, Oriented to Situation Alcohol / Substance Use: Not Applicable Psych Involvement: No (comment)  Admission diagnosis:  Acute exacerbation of CHF (congestive heart failure) ([place]) [I50.9] Acute congestive heart failure, unspecified heart failure type ([place]) [I50.9] Acute CHF (congestive heart failure) ([place]) [I50.9] Patient Active Problem List   Diagnosis Date Noted  . Acute exacerbation of CHF (congestive heart failure) ([place]) 10/15/2019  . PAF (paroxysmal atrial fibrillation) ([place]) 10/15/2019  . Essential hypertension 10/15/2019  . History of ischemic stroke 10/15/2019  . Acute CHF (congestive heart failure) ([place]) 10/15/2019  PCP:  Patient, No Pcp Per Pharmacy:   [id number] - [place], [place] - [address] AT [facility] & [address] [facility] [address] Phone: [phone] Fax: [phone]     Social Determinants of Health (SDOH) Interventions    Readmission Risk  Interventions No flowsheet data found.

## 2019-10-17 NOTE — Progress Notes (Signed)
Pt noted to be more confused than the night prior. Pt states she gets confused from time to time. On call provider made aware.

## 2019-10-17 NOTE — Progress Notes (Signed)
Progress Note  Patient Name: Kaitlyn [name] Date of Encounter: 10/17/2019  Primary Cardiologist: No primary care provider on file.  Dr. Acie Fredrickson at [facility] is better.  No chest pain  Inpatient Medications    Scheduled Meds: . citalopram  10 mg Oral Daily  . famotidine  10 mg Oral Daily  . furosemide  20 mg Oral Daily  . magnesium oxide  400 mg Oral Daily  . metoprolol succinate  50 mg Oral Daily  . potassium chloride  20 mEq Oral Daily  . propafenone  150 mg Oral Daily  . rOPINIRole  1 mg Oral QHS   Continuous Infusions:  PRN Meds: clonazePAM   Vital Signs    Vitals:   10/16/19 2029 10/17/19 0059 10/17/19 0334 10/17/19 0727  BP: (!) 139/93 (!) 154/98 (!) 158/103 (!) 161/105  Pulse: 65 65 66 65  Resp: 18 18 18 18   Temp: 98 F (36.7 C) 98 F (36.7 C) (!) 97.5 F (36.4 C) 98 F (36.7 C)  TempSrc: Oral Oral Oral Oral  SpO2: 96% 98% 94% 98%  Weight:  65.7 kg    Height:        Intake/Output Summary (Last 24 hours) at 10/17/2019 0902 Last data filed at 10/17/2019 0111 Gross per 24 hour  Intake 360 ml  Output 550 ml  Net -190 ml   Last 3 Weights 10/17/2019 10/16/2019 10/15/2019  Weight (lbs) 144 lb 14.4 oz 148 lb 1.6 oz 152 lb 3.2 oz  Weight (kg) 65.726 kg 67.178 kg 69.037 kg      Telemetry    V pacing- Personally Reviewed  ECG    V pacing- Personally Reviewed  Physical Exam   GEN: No acute distress.   Neck: No JVD Cardiac: RRR, 3/6 SM, no rubs, or gallops.  Respiratory: Clear to auscultation bilaterally. GI: Soft, nontender, non-distended  MS: No edema; No deformity. Neuro:  Nonfocal  Psych: Normal affect   Labs    High Sensitivity Troponin:   Recent Labs  Lab 10/15/19 1337 10/15/19 1729  TROPONINIHS 31* 29*      Chemistry Recent Labs  Lab 10/15/19 1337 10/16/19 0455 10/17/19 0504  NA 138 141 141  K 4.4 3.5 3.7  CL 102 104 105  CO2 28 28 26   GLUCOSE 107* 111* 110*  BUN 16 19 20   CREATININE 0.85 0.89  0.95  CALCIUM 9.1 9.4 9.1  GFRNONAA >60 >60 58*  GFRAA >60 >60 >60  ANIONGAP 8 9 10      Hematology Recent Labs  Lab 10/15/19 1337 10/16/19 0455 10/17/19 0504  WBC 7.6 6.9 8.6  RBC 5.00 4.78 4.97  HGB 13.9 13.3 13.7  HCT 44.1 41.8 43.5  MCV 88.2 87.4 87.5  MCH 27.8 27.8 27.6  MCHC 31.5 31.8 31.5  RDW 16.0* 15.8* 15.9*  PLT 265 249 269    BNP Recent Labs  Lab 10/15/19 1338  BNP 1,346.3*     DDimer No results for input(s): DDIMER in the last 168 hours.   Radiology    DG Chest Portable 1 View  Result Date: 10/15/2019 CLINICAL DATA:  Cough and shortness of breath. Flu like symptoms for 4 days. EXAM: PORTABLE CHEST 1 VIEW COMPARISON:  Radiograph 11/22/2017 FINDINGS: Left-sided pacemaker remains in place. Cardiomegaly is increased from prior exam. Unchanged mediastinal contours. There is a retrocardiac hiatal hernia. Interstitial and peribronchial thickening suspicious for pulmonary edema. Small pleural effusions. No confluent airspace disease. Reverse left shoulder arthroplasty. No acute  osseous abnormalities are seen. IMPRESSION: 1. Cardiomegaly and small pleural effusions. Interstitial thickening suspicious for pulmonary edema. Findings suspicious for CHF. 2. Hiatal hernia. Electronically Signed   By: Narda Rutherford M.D.   On: 10/15/2019 13:36   ECHOCARDIOGRAM COMPLETE  Result Date: 10/16/2019    ECHOCARDIOGRAM REPORT   Patient Name:   Kaitlyn [name] Date of Exam: 10/16/2019 Medical Rec #:  [id number]     Height:       62.0 in Accession #:    [id number]    Weight:       148.1 lb Date of Birth:  [birth year]     BSA:          1.683 m Patient Age:    79 years      BP:           152/88 mmHg Patient Gender: F             HR:           65 bpm. Exam Location:  Inpatient Procedure: 2D Echo, Cardiac Doppler and Color Doppler Indications:    I50.23 Acute on chronic systolic (congestive) heart failure  History:        Patient has no prior history of Echocardiogram examinations.                  Pacemaker, Stroke, Arrythmias:Atrial Fibrillation; Risk                 Factors:Current Smoker and Hypertension.  Sonographer:    Elmarie Shiley Dance Referring Phys: [id number] ARSHAD N KAKRAKANDY IMPRESSIONS  1. Left ventricular ejection fraction, by estimation, is 50%. The left ventricle has mildly decreased function. The left ventricle demonstrates global hypokinesis. There is mild left ventricular hypertrophy. Left ventricular diastolic parameters are indeterminate. Elevated left ventricular end-diastolic pressure.  2. Right ventricular systolic function is normal. The right ventricular size is normal. There is mildly elevated pulmonary artery systolic pressure. The estimated right ventricular systolic pressure is 40.2 mmHg.  3. Left atrial size was moderately dilated.  4. Right atrial size was mildly dilated.  5. The mitral valve is abnormal. Mild to moderate mitral valve regurgitation. No evidence of mitral stenosis.  6. Tricuspid valve regurgitation is moderate.  7. The aortic valve is severely calcified with severely reduced leaflet motion. The dimensionless index is 0.17, however LVOT TVI may be underestimated. Overall findings suggest at least moderate-severe aortic valve stenosis. The aortic valve is abnormal. Aortic valve regurgitation is not visualized. Aortic valve mean gradient measures 21.0 mmHg.  8. The inferior vena cava is dilated in size with <50% respiratory variability, suggesting right atrial pressure of 15 mmHg. FINDINGS  Left Ventricle: Left ventricular ejection fraction, by estimation, is 50%. The left ventricle has mildly decreased function. The left ventricle demonstrates global hypokinesis. The left ventricular internal cavity size was normal in size. There is mild left ventricular hypertrophy. Left ventricular diastolic parameters are indeterminate. Elevated left ventricular end-diastolic pressure. Right Ventricle: The right ventricular size is normal. No increase in right ventricular wall  thickness. Right ventricular systolic function is normal. There is mildly elevated pulmonary artery systolic pressure. The tricuspid regurgitant velocity is 2.51  m/s, and with an assumed right atrial pressure of 15 mmHg, the estimated right ventricular systolic pressure is 40.2 mmHg. Left Atrium: Left atrial size was moderately dilated. Right Atrium: Right atrial size was mildly dilated. Pericardium: There is no evidence of pericardial effusion. Mitral Valve: The mitral valve is abnormal. Normal mobility of the mitral  valve leaflets. Moderate mitral annular calcification. Mild to moderate mitral valve regurgitation. No evidence of mitral valve stenosis. Tricuspid Valve: The tricuspid valve is normal in structure. Tricuspid valve regurgitation is moderate . No evidence of tricuspid stenosis. Aortic Valve: The aortic valve is severely calcified with severely reduced leaflet motion. The dimensionless index is 0.17, however LVOT TVI may be underestimated. Overall findings suggest at least moderate-severe aortic valve stenosis. The aortic valve is abnormal. Aortic valve regurgitation is not visualized. Aortic valve mean gradient measures 21.0 mmHg. Aortic valve peak gradient measures 31.6 mmHg. Aortic valve area, by VTI measures 0.48 cm. Pulmonic Valve: The pulmonic valve was normal in structure. Pulmonic valve regurgitation is trivial. No evidence of pulmonic stenosis. Aorta: The aortic root is normal in size and structure and the ascending aorta was not well visualized. Venous: The inferior vena cava is dilated in size with less than 50% respiratory variability, suggesting right atrial pressure of 15 mmHg. IAS/Shunts: No atrial level shunt detected by color flow Doppler. Additional Comments: A pacer wire is visualized in the right atrium and right ventricle.  LEFT VENTRICLE PLAX 2D LVIDd:         3.80 cm  Diastology LVIDs:         3.00 cm  LV e' lateral:   4.33 cm/s LV PW:         1.40 cm  LV E/e' lateral: 20.7 LV  IVS:        1.00 cm  LV e' medial:    2.87 cm/s LVOT diam:     1.90 cm  LV E/e' medial:  31.3 LV SV:         31 LV SV Index:   19 LVOT Area:     2.84 cm  RIGHT VENTRICLE            IVC RV Basal diam:  3.00 cm    IVC diam: 2.60 cm RV Mid diam:    2.00 cm RV S prime:     7.09 cm/s TAPSE (M-mode): 1.4 cm LEFT ATRIUM             Index LA diam:        4.60 cm 2.73 cm/m LA Vol (A2C):   74.3 ml 44.16 ml/m LA Vol (A4C):   53.1 ml 31.56 ml/m LA Biplane Vol: 64.3 ml 38.22 ml/m  AORTIC VALVE AV Area (Vmax):    0.51 cm AV Area (Vmean):   0.45 cm AV Area (VTI):     0.48 cm AV Vmax:           281.00 cm/s AV Vmean:          203.500 cm/s AV VTI:            0.644 m AV Peak Grad:      31.6 mmHg AV Mean Grad:      21.0 mmHg LVOT Vmax:         51.00 cm/s LVOT Vmean:        32.400 cm/s LVOT VTI:          0.110 m LVOT/AV VTI ratio: 0.17  AORTA Ao Root diam: 3.20 cm Ao Asc diam:  3.70 cm MITRAL VALVE               TRICUSPID VALVE MV Area (PHT): 1.83 cm    TR Peak grad:   25.2 mmHg MV Decel Time: 414 msec    TR Vmax:        251.00 cm/s MV E velocity:  89.75 cm/s                            SHUNTS                            Systemic VTI:  0.11 m                            Systemic Diam: 1.90 cm Weston Brass MD Electronically signed by Weston Brass MD Signature Date/Time: 10/16/2019/10:40:43 PM    Final     Cardiac Studies   ECHO EF 50 with moderate AS  Patient Profile     79 y.o. female with acute diastolic heart failure, pacemaker at [facility]  Assessment & Plan    Acute diastolic heart failure -Continue to diurese.  Creatinine 0.95, potassium 3.7.  BNP on admission 1300. Now on PO lasix  Mildly elevated troponin -29 in the setting of acute diastolic heart failure-demand ischemia, not ACS  Pacemaker at [facility] -Appreciate EP service, Dr. Graciela Husbands, [facility].  Recent stroke -Continue with aggressive secondary risk factor prevention -This occurred during her hold of Xarelto subsequent to GI bleed.  She was  subsequently started on Eliquis.  Watchman evaluation by EP.    Paroxysmal atrial fibrillation -Currently appears to be in sinus rhythm.  Had GI bleed last year.  EGD and colonoscopy was negative. -On Rythmol antiarrhythmic therapy as well as metoprolol.  TSH is normal.  Antiarrhythmic per EP.  Moderate to severe aortic stenosis -Mean gradient 19.  Probably on the moderate side.  Essential hypertension -Blood pressures have been consistently elevated during this admission.  Hopefully they will continue to decrease as Lasix improves diuresis. -Also on metoprolol 50 mg a day.  Was reasonably/well controlled on Oct 15, 2019 office visit in care everywhere.  Has history of VSD --no evidence on recent echo      For questions or updates, please contact [facility] Please consult [email or web address] for contact info under        Signed, Donato Schultz, MD  10/17/2019, 9:02 AM

## 2019-10-18 ENCOUNTER — Inpatient Hospital Stay (HOSPITAL_COMMUNITY): Payer: Medicare Other

## 2019-10-18 LAB — CBC
HCT: 42.7 % (ref 36.0–46.0)
Hemoglobin: 13.2 g/dL (ref 12.0–15.0)
MCH: 27.4 pg (ref 26.0–34.0)
MCHC: 30.9 g/dL (ref 30.0–36.0)
MCV: 88.6 fL (ref 80.0–100.0)
Platelets: 256 10*3/uL (ref 150–400)
RBC: 4.82 MIL/uL (ref 3.87–5.11)
RDW: 15.6 % — ABNORMAL HIGH (ref 11.5–15.5)
WBC: 6.8 10*3/uL (ref 4.0–10.5)
nRBC: 0 % (ref 0.0–0.2)

## 2019-10-18 LAB — HEPATIC FUNCTION PANEL
ALT: 35 U/L (ref 0–44)
AST: 38 U/L (ref 15–41)
Albumin: 2.9 g/dL — ABNORMAL LOW (ref 3.5–5.0)
Alkaline Phosphatase: 90 U/L (ref 38–126)
Bilirubin, Direct: 0.5 mg/dL — ABNORMAL HIGH (ref 0.0–0.2)
Indirect Bilirubin: 1.4 mg/dL — ABNORMAL HIGH (ref 0.3–0.9)
Total Bilirubin: 1.9 mg/dL — ABNORMAL HIGH (ref 0.3–1.2)
Total Protein: 5.6 g/dL — ABNORMAL LOW (ref 6.5–8.1)

## 2019-10-18 LAB — BASIC METABOLIC PANEL
Anion gap: 9 (ref 5–15)
BUN: 15 mg/dL (ref 8–23)
CO2: 30 mmol/L (ref 22–32)
Calcium: 8.8 mg/dL — ABNORMAL LOW (ref 8.9–10.3)
Chloride: 103 mmol/L (ref 98–111)
Creatinine, Ser: 0.92 mg/dL (ref 0.44–1.00)
GFR calc Af Amer: >60 mL/min (ref >60)
GFR calc non Af Amer: >60 mL/min (ref >60)
Glucose, Bld: 93 mg/dL (ref 70–99)
Potassium: 4 mmol/L (ref 3.5–5.1)
Sodium: 142 mmol/L (ref 135–145)

## 2019-10-18 LAB — AMMONIA: Ammonia: 19 umol/L (ref 9–35)

## 2019-10-18 MED ORDER — AMLODIPINE BESYLATE 2.5 MG PO TABS
2.5000 mg | ORAL_TABLET | Freq: Every day | ORAL | Status: DC
  Administered 2019-10-18: 2.5 mg via ORAL
  Filled 2019-10-18: qty 1

## 2019-10-18 MED ORDER — LACTULOSE 10 GM/15ML PO SOLN
20.0000 g | Freq: Two times a day (BID) | ORAL | Status: DC
  Administered 2019-10-18 – 2019-10-20 (×4): 20 g via ORAL
  Filled 2019-10-18 (×4): qty 30

## 2019-10-18 NOTE — Progress Notes (Signed)
Progress Note  Patient Name: Kaitlyn [name] Date of Encounter: 10/18/2019  Primary Cardiologist:   No primary care provider on file.   Subjective   Very confused but pleasant and in no distress.   Inpatient Medications    Scheduled Meds: . apixaban  5 mg Oral BID  . citalopram  10 mg Oral Daily  . famotidine  10 mg Oral Daily  . furosemide  20 mg Oral Daily  . lactulose  20 g Oral TID  . magnesium oxide  400 mg Oral Daily  . metoprolol succinate  50 mg Oral Daily  . potassium chloride  20 mEq Oral Daily  . propafenone  150 mg Oral Daily  . rOPINIRole  1 mg Oral QHS   Continuous Infusions:  PRN Meds: clonazePAM   Vital Signs    Vitals:   10/17/19 2353 10/18/19 0034 10/18/19 0435 10/18/19 0611  BP: (!) 142/83  (!) 174/87 (!) 172/95  Pulse: 61  60 60  Resp: 16  18 18   Temp: 97.6 F (36.4 C)  97.6 F (36.4 C)   TempSrc: Axillary  Oral   SpO2: 97%  94% 98%  Weight:  64.5 kg    Height:        Intake/Output Summary (Last 24 hours) at 10/18/2019 0907 Last data filed at 10/18/2019 0831 Gross per 24 hour  Intake 390.5 ml  Output 850 ml  Net -459.5 ml   Filed Weights   10/16/19 0101 10/17/19 0059 10/18/19 0034  Weight: 67.2 kg 65.7 kg 64.5 kg    Telemetry    Atrial paced demand pacing - Personally Reviewed  ECG    NA - Personally Reviewed  Physical Exam   GEN: No acute distress.   Neck: No  JVD Cardiac: RRR, 3/6 systolic murmur at the apex, no diastolic murmurs, rubs, or gallops.  Respiratory: Clear  to auscultation bilaterally. GI: Soft, nontender, non-distended  MS: No edema; No deformity. Neuro:  Nonfocal  Psych: Normal affect  Chest:  Pacer dressing clean and dry.  No swelling.   Labs    Chemistry Recent Labs  Lab 10/16/19 0455 10/17/19 0504 10/17/19 1445 10/18/19 0602 10/18/19 0720  NA 141 141  --  142  --   K 3.5 3.7  --  4.0  --   CL 104 105  --  103  --   CO2 28 26  --  30  --   GLUCOSE 111* 110*  --  93  --   BUN 19 20  --   15  --   CREATININE 0.89 0.95  --  0.92  --   CALCIUM 9.4 9.1  --  8.8*  --   PROT  --   --  5.7*  --  5.6*  ALBUMIN  --   --  3.2*  --  2.9*  AST  --   --  36  --  38  ALT  --   --  45*  --  35  ALKPHOS  --   --  95  --  90  BILITOT  --   --  2.2*  --  1.9*  GFRNONAA >60 58*  --  >60  --   GFRAA >60 >60  --  >60  --   ANIONGAP 9 10  --  9  --      Hematology Recent Labs  Lab 10/16/19 0455 10/17/19 0504 10/18/19 0720  WBC 6.9 8.6 6.8  RBC 4.78 4.97 4.82  HGB  13.3 13.7 13.2  HCT 41.8 43.5 42.7  MCV 87.4 87.5 88.6  MCH 27.8 27.6 27.4  MCHC 31.8 31.5 30.9  RDW 15.8* 15.9* 15.6*  PLT 249 269 256    Cardiac EnzymesNo results for input(s): TROPONINI in the last 168 hours. No results for input(s): TROPIPOC in the last 168 hours.   BNP Recent Labs  Lab 10/15/19 1338  BNP 1,346.3*     DDimer No results for input(s): DDIMER in the last 168 hours.   Radiology    CT HEAD WO CONTRAST  Result Date: 10/17/2019 CLINICAL DATA:  Altered mental status with delirium EXAM: CT HEAD WITHOUT CONTRAST TECHNIQUE: Contiguous axial images were obtained from the base of the skull through the vertex without intravenous contrast. COMPARISON:  None. FINDINGS: Brain: There is mild diffuse atrophy. There is no intracranial mass, hemorrhage, extra-axial fluid collection, or midline shift. There is evidence of a prior infarct in the posterior right frontal lobe extending to the level of the right sylvian fissure. Elsewhere there is slight small vessel disease in the centra semiovale bilaterally. No acute infarct is evident. Vascular: No hyperdense vessel. There is calcification in each distal vertebral artery and carotid siphon region. Skull: The bony calvarium appears intact. Sinuses/Orbits: There is mucosal thickening in several ethmoid air cells. Other visualized paranasal sinuses are clear. Orbits appear symmetric bilaterally. Other: Mastoid air cells are clear. IMPRESSION: Mild atrophy. Prior focal  infarct involving a portion of the posterior right frontal lobe. Mild periventricular small vessel disease. No acute infarct. No mass or hemorrhage. There are multiple foci of arterial vascular calcification. There is mucosal thickening in several ethmoid air cells. Electronically Signed   By: Lowella Grip III M.D.   On: 10/17/2019 13:30   ECHOCARDIOGRAM COMPLETE  Result Date: 10/16/2019    ECHOCARDIOGRAM REPORT   Patient Name:   Kaitlyn [name] Date of Exam: 10/16/2019 Medical Rec #:  [id number]     Height:       62.0 in Accession #:    [id number]    Weight:       148.1 lb Date of Birth:  1941/04/07     BSA:          1.683 m Patient Age:    79 years      BP:           152/88 mmHg Patient Gender: F             HR:           65 bpm. Exam Location:  Inpatient Procedure: 2D Echo, Cardiac Doppler and Color Doppler Indications:    I50.23 Acute on chronic systolic (congestive) heart failure  History:        Patient has no prior history of Echocardiogram examinations.                 Pacemaker, Stroke, Arrythmias:Atrial Fibrillation; Risk                 Factors:Current Smoker and Hypertension.  Sonographer:    Jonelle Sidle Dance Referring Phys: [place]  1. Left ventricular ejection fraction, by estimation, is 50%. The left ventricle has mildly decreased function. The left ventricle demonstrates global hypokinesis. There is mild left ventricular hypertrophy. Left ventricular diastolic parameters are indeterminate. Elevated left ventricular end-diastolic pressure.  2. Right ventricular systolic function is normal. The right ventricular size is normal. There is mildly elevated pulmonary artery systolic pressure. The estimated right ventricular systolic pressure is 40.2  mmHg.  3. Left atrial size was moderately dilated.  4. Right atrial size was mildly dilated.  5. The mitral valve is abnormal. Mild to moderate mitral valve regurgitation. No evidence of mitral stenosis.  6. Tricuspid valve  regurgitation is moderate.  7. The aortic valve is severely calcified with severely reduced leaflet motion. The dimensionless index is 0.17, however LVOT TVI may be underestimated. Overall findings suggest at least moderate-severe aortic valve stenosis. The aortic valve is abnormal. Aortic valve regurgitation is not visualized. Aortic valve mean gradient measures 21.0 mmHg.  8. The inferior vena cava is dilated in size with <50% respiratory variability, suggesting right atrial pressure of 15 mmHg. FINDINGS  Left Ventricle: Left ventricular ejection fraction, by estimation, is 50%. The left ventricle has mildly decreased function. The left ventricle demonstrates global hypokinesis. The left ventricular internal cavity size was normal in size. There is mild left ventricular hypertrophy. Left ventricular diastolic parameters are indeterminate. Elevated left ventricular end-diastolic pressure. Right Ventricle: The right ventricular size is normal. No increase in right ventricular wall thickness. Right ventricular systolic function is normal. There is mildly elevated pulmonary artery systolic pressure. The tricuspid regurgitant velocity is 2.51  m/s, and with an assumed right atrial pressure of 15 mmHg, the estimated right ventricular systolic pressure is 40.2 mmHg. Left Atrium: Left atrial size was moderately dilated. Right Atrium: Right atrial size was mildly dilated. Pericardium: There is no evidence of pericardial effusion. Mitral Valve: The mitral valve is abnormal. Normal mobility of the mitral valve leaflets. Moderate mitral annular calcification. Mild to moderate mitral valve regurgitation. No evidence of mitral valve stenosis. Tricuspid Valve: The tricuspid valve is normal in structure. Tricuspid valve regurgitation is moderate . No evidence of tricuspid stenosis. Aortic Valve: The aortic valve is severely calcified with severely reduced leaflet motion. The dimensionless index is 0.17, however LVOT TVI may be  underestimated. Overall findings suggest at least moderate-severe aortic valve stenosis. The aortic valve is abnormal. Aortic valve regurgitation is not visualized. Aortic valve mean gradient measures 21.0 mmHg. Aortic valve peak gradient measures 31.6 mmHg. Aortic valve area, by VTI measures 0.48 cm. Pulmonic Valve: The pulmonic valve was normal in structure. Pulmonic valve regurgitation is trivial. No evidence of pulmonic stenosis. Aorta: The aortic root is normal in size and structure and the ascending aorta was not well visualized. Venous: The inferior vena cava is dilated in size with less than 50% respiratory variability, suggesting right atrial pressure of 15 mmHg. IAS/Shunts: No atrial level shunt detected by color flow Doppler. Additional Comments: A pacer wire is visualized in the right atrium and right ventricle.  LEFT VENTRICLE PLAX 2D LVIDd:         3.80 cm  Diastology LVIDs:         3.00 cm  LV e' lateral:   4.33 cm/s LV PW:         1.40 cm  LV E/e' lateral: 20.7 LV IVS:        1.00 cm  LV e' medial:    2.87 cm/s LVOT diam:     1.90 cm  LV E/e' medial:  31.3 LV SV:         31 LV SV Index:   19 LVOT Area:     2.84 cm  RIGHT VENTRICLE            IVC RV Basal diam:  3.00 cm    IVC diam: 2.60 cm RV Mid diam:    2.00 cm RV S prime:  7.09 cm/s TAPSE (M-mode): 1.4 cm LEFT ATRIUM             Index LA diam:        4.60 cm 2.73 cm/m LA Vol (A2C):   74.3 ml 44.16 ml/m LA Vol (A4C):   53.1 ml 31.56 ml/m LA Biplane Vol: 64.3 ml 38.22 ml/m  AORTIC VALVE AV Area (Vmax):    0.51 cm AV Area (Vmean):   0.45 cm AV Area (VTI):     0.48 cm AV Vmax:           281.00 cm/s AV Vmean:          203.500 cm/s AV VTI:            0.644 m AV Peak Grad:      31.6 mmHg AV Mean Grad:      21.0 mmHg LVOT Vmax:         51.00 cm/s LVOT Vmean:        32.400 cm/s LVOT VTI:          0.110 m LVOT/AV VTI ratio: 0.17  AORTA Ao Root diam: 3.20 cm Ao Asc diam:  3.70 cm MITRAL VALVE               TRICUSPID VALVE MV Area (PHT): 1.83 cm     TR Peak grad:   25.2 mmHg MV Decel Time: 414 msec    TR Vmax:        251.00 cm/s MV E velocity: 89.75 cm/s                            SHUNTS                            Systemic VTI:  0.11 m                            Systemic Diam: 1.90 cm Weston Brass MD Electronically signed by Weston Brass MD Signature Date/Time: 10/16/2019/10:40:43 PM    Final     Cardiac Studies   Echo:    1. Left ventricular ejection fraction, by estimation, is 50%. The left  ventricle has mildly decreased function. The left ventricle demonstrates  global hypokinesis. There is mild left ventricular hypertrophy. Left  ventricular diastolic parameters are  indeterminate. Elevated left ventricular end-diastolic pressure.  2. Right ventricular systolic function is normal. The right ventricular  size is normal. There is mildly elevated pulmonary artery systolic  pressure. The estimated right ventricular systolic pressure is 40.2 mmHg.  3. Left atrial size was moderately dilated.  4. Right atrial size was mildly dilated.  5. The mitral valve is abnormal. Mild to moderate mitral valve  regurgitation. No evidence of mitral stenosis.  6. Tricuspid valve regurgitation is moderate.  7. The aortic valve is severely calcified with severely reduced leaflet  motion. The dimensionless index is 0.17, however LVOT TVI may be  underestimated. 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 The aortic valve is  abnormal. Aortic valve regurgitation is not visualized. Aortic valve mean  gradient measures 21.0 mmHg.  8. The inferior vena cava is dilated in size with <50% respiratory  variability, suggesting right atrial pressure of 15 mmHg.   Patient Profile     79 y.o. female with acute diastolic heart failure, pacemaker at [facility]  Assessment & Plan  ACUTE DIASTOLIC HF:   Transitioned to oral Lasix.  Net negative 4.1 liters.  Continue current therapy with changes as below.   PACEMAKER    Replaced this admission.  Will now follow with our EP service    PAF:   Resume Eliquis this AM per Dr Odessa Fleming note. Marland Kitchen   MODERATE TO SEVERE:   Probably on the moderate side.  Will manage clinically.   HTN:  I will add Norvasc 2.5 mg daily.    For questions or updates, please contact [facility] Please consult [email or web address] for contact info under Cardiology/STEMI.   Signed, Rollene Rotunda, MD  10/18/2019, 9:07 AM

## 2019-10-18 NOTE — Progress Notes (Signed)
ANTICOAGULATION CONSULT NOTE - Initial Consult  Pharmacy Consult for apixaban Indication: atrial fibrillation  Allergies  Allergen Reactions  . Lisinopril Cough         Patient Measurements: Height: 5\' 2"  (157.5 cm) Weight: 64.5 kg (142 lb 3.2 oz)(scale c) IBW/kg (Calculated) : 50.1 Heparin Dosing Weight: N/A  Vital Signs: Temp: 97.6 F (36.4 C) (05/22 0435) Temp Source: Oral (05/22 0435) BP: 172/95 (05/22 0611) Pulse Rate: 60 (05/22 0611)  Labs: Recent Labs    10/15/19 1337 10/15/19 1337 10/15/19 1729 10/16/19 0455 10/16/19 0455 10/17/19 0504 10/18/19 0602 10/18/19 0720  HGB 13.9   < >  --  13.3   < > 13.7  --  13.2  HCT 44.1   < >  --  41.8  --  43.5  --  42.7  PLT 265   < >  --  249  --  269  --  256  CREATININE 0.85   < >  --  0.89  --  0.95 0.92  --   TROPONINIHS 31*  --  29*  --   --   --   --   --    < > = values in this interval not displayed.    Estimated Creatinine Clearance: 45.2 mL/min (by C-G formula based on SCr of 0.92 mg/dL).   Medical History: Past Medical History:  Diagnosis Date  . A-fib (HCC)   . Hypertension   . Presence of permanent cardiac pacemaker   . Stroke [facility])     Medications:  Scheduled:  . amLODipine  2.5 mg Oral Daily  . apixaban  5 mg Oral BID  . citalopram  10 mg Oral Daily  . famotidine  10 mg Oral Daily  . furosemide  20 mg Oral Daily  . lactulose  20 g Oral TID  . magnesium oxide  400 mg Oral Daily  . metoprolol succinate  50 mg Oral Daily  . potassium chloride  20 mEq Oral Daily  . propafenone  150 mg Oral Daily  . rOPINIRole  1 mg Oral QHS    Assessment: 79yof with a history of afib on apixaban 5mg  BID PTA. Apixaban initially on hold for pacer gen change. Pharmacy consulted to restart apixaban.   CBC stable  Goal of Therapy:  Monitor platelets by anticoagulation protocol: Yes   Plan:  Restart PTA apixaban 5mg  BID Pharmacy will sign off and follow peripherally, please let [facility] know if any further  assistance is needed.  , PharmD PGY1 Ambulatory Care Pharmacy Resident 10/18/2019,9:31 AM

## 2019-10-18 NOTE — Progress Notes (Signed)
PROGRESS NOTE    Kaitlyn [name]  PFX:[id number] DOB: Jun 16, 1940 DOA: 10/15/2019 PCP: Patient, No Pcp Per   Brief Narrative: 79 year old with past medical history significant for A. fib, status post pacemaker, recent stroke January 2021 10 in dysarthria, grade 2 diastolic dysfunction, severe aortic stenosis, who presents complaining of worsening shortness of breath over the last 3 days prior to admission and lower extremity edema.  Evaluation in the ED chest x-ray showed congestion and pleural effusion, BNP 1300, troponin 31/29.  Covid test negative, EKG shows paced rhythm.  Patient has been admitted for heart failure exacerbation.    Assessment & Plan:   Principal Problem:   Acute exacerbation of CHF (congestive heart failure) (HCC) Active Problems:   PAF (paroxysmal atrial fibrillation) (HCC)   Essential hypertension   History of ischemic stroke   Acute CHF (congestive heart failure) (HCC)  1-Acute on chronic diastolic heart failure exacerbation, severe aortic stenosis: -Patient presented with worsening shortness of breath, chest x-ray with pulmonary edema, BNP elevated at 1300. -She has received 3 doses of IV lasix. Discussed with cardiology, plan to transition to oral lasix.  -Weight; 152---148--144 -Output 3.9 L. -improved.   2-Paroxysmal A. fib:  Status post pacemaker, cardiology will interrogate pacer and evaluate battery. Pacemaker was exchange. 5/21. Continue with apixaban and metoprolol. On propafenone.  3-Recent stroke Speech difficulties Continue with apixaban Still with aphasia and confusion, ammonia normal. Will proceed with MRI and EEG.   4-History of anxiety or depression: Continue with citalopram.  5-History of GI  bleed; monitor on Eliquis.   6-Aortic stenosis, moderate to severe ECHO from [facility]. ECHO repeated here. Cardiology consulted.  7-Acute hepatic encephalopathy;  More confuse. Per husband patient has been confuse since the stroke.   ammonia ;  elevated at 90 --19 Started  lactulose.   B 12 level normal.  Repeated CT head; Mild atrophy. Prior focal infarct involving a portion of the posterior right frontal lobe. Mild periventricular small vessel disease. No acute infarct. No mass or hemorrhage. Check MRI and EEG.   7-Elevation ammonia; check LFT, [place] LFT elevated.   Estimated body mass index is 26.01 kg/m as calculated from the following:   Height as of this encounter: 5\' 2"  (1.575 m).   Weight as of this encounter: 64.5 kg.   DVT prophylaxis: Apixaban Code Status: Full code Family Communication: care discussed with patient. Husband updated over phone Disposition Plan:  Status is: Inpatient  Remains inpatient appropriate because:Ongoing diagnostic testing needed not appropriate for outpatient work up   Dispo: The patient is from: Home              Anticipated d/c is to: Home              Anticipated d/c date is: 1 day              Patient currently is not medically stable to d/c.     Consultants:   Cardiology   Procedures:   None  Antimicrobials:  None  Subjective: She denies dyspnea. Still confuse but improved per husband.   Objective: Vitals:   10/18/19 0034 10/18/19 0435 10/18/19 0611 10/18/19 1140  BP:  (!) 174/87 (!) 172/95 (!) 159/81  Pulse:  60 60 (!) 59  Resp:  18 18 16   Temp:  97.6 F (36.4 C)  (!) 97.3 F (36.3 C)  TempSrc:  Oral  Oral  SpO2:  94% 98% 96%  Weight: 64.5 kg     Height:  Intake/Output Summary (Last 24 hours) at 10/18/2019 1210 Last data filed at 10/18/2019 0831 Gross per 24 hour  Intake 390.5 ml  Output 400 ml  Net -9.5 ml   Filed Weights   10/16/19 0101 10/17/19 0059 10/18/19 0034  Weight: 67.2 kg 65.7 kg 64.5 kg    Examination:  General exam: NAD Respiratory system: CTA Cardiovascular system: S 1, S 2 Gastrointestinal system: BS present, soft , nt Central nervous system: alert, follows command, not oriented to place.  Extremities:symmetric power.   Skin: no rashes   Data Reviewed: I have personally reviewed following labs and imaging studies  CBC: Recent Labs  Lab 10/15/19 1337 10/16/19 0455 10/17/19 0504 10/18/19 0720  WBC 7.6 6.9 8.6 6.8  NEUTROABS 5.6 4.2  --   --   HGB 13.9 13.3 13.7 13.2  HCT 44.1 41.8 43.5 42.7  MCV 88.2 87.4 87.5 88.6  PLT 265 249 269 256   Basic Metabolic Panel: Recent Labs  Lab 10/15/19 1337 10/16/19 0455 10/17/19 0504 10/18/19 0602  NA 138 141 141 142  K 4.4 3.5 3.7 4.0  CL 102 104 105 103  CO2 28 28 26 30   GLUCOSE 107* 111* 110* 93  BUN 16 19 20 15   CREATININE 0.85 0.89 0.95 0.92  CALCIUM 9.1 9.4 9.1 8.8*  MG  --  1.7  --   --    GFR: Estimated Creatinine Clearance: 45.2 mL/min (by C-G formula based on SCr of 0.92 mg/dL). Liver Function Tests: Recent Labs  Lab 10/17/19 1445 10/18/19 0720  AST 36 38  ALT 45* 35  ALKPHOS 95 90  BILITOT 2.2* 1.9*  PROT 5.7* 5.6*  ALBUMIN 3.2* 2.9*   No results for input(s): LIPASE, AMYLASE in the last 168 hours. Recent Labs  Lab 10/17/19 1059 10/18/19 0602  AMMONIA 93* 19   Coagulation Profile: No results for input(s): INR, PROTIME in the last 168 hours. Cardiac Enzymes: No results for input(s): CKTOTAL, CKMB, CKMBINDEX, TROPONINI in the last 168 hours. BNP (last 3 results) No results for input(s): PROBNP in the last 8760 hours. HbA1C: No results for input(s): HGBA1C in the last 72 hours. CBG: No results for input(s): GLUCAP in the last 168 hours. Lipid Profile: No results for input(s): CHOL, HDL, LDLCALC, TRIG, CHOLHDL, LDLDIRECT in the last 72 hours. Thyroid Function Tests: Recent Labs    10/16/19 0455  TSH 2.824   Anemia Panel: Recent Labs    10/17/19 1059  VITAMINB12 840   Sepsis Labs: No results for input(s): PROCALCITON, LATICACIDVEN in the last 168 hours.  Recent Results (from the past 240 hour(s))  SARS Coronavirus 2 by RT PCR (hospital order, [facility]) Nasopharyngeal  Nasopharyngeal Swab     Status: None   Collection Time: 10/15/19  3:07 PM   Specimen: Nasopharyngeal Swab  Result Value Ref Range Status   SARS Coronavirus 2 NEGATIVE NEGATIVE Final    Comment: (NOTE) SARS-CoV-2 target nucleic acids are NOT DETECTED. The SARS-CoV-2 RNA is generally detectable in upper and lower respiratory specimens during the acute phase of infection. The lowest concentration of SARS-CoV-2 viral copies this assay can detect is 250 copies / mL. A negative result does not preclude SARS-CoV-2 infection and should not be used as the sole basis for treatment or other patient management decisions.  A negative result may occur with improper specimen collection / handling, submission of specimen other than nasopharyngeal swab, presence of viral mutation(s) within the areas targeted by this assay, and inadequate  number of viral copies (<250 copies / mL). A negative result must be combined with clinical observations, patient history, and epidemiological information. Fact Sheet for Patients:   BoilerBrush.com.cy Fact Sheet for Healthcare Providers: https://pope.com/ This test is not yet approved or cleared  by the [place] FDA and has been authorized for detection and/or diagnosis of SARS-CoV-2 by FDA under an Emergency Use Authorization (EUA).  This EUA will remain in effect (meaning this test can be used) for the duration of the COVID-19 declaration under Section 564(b)(1) of the Act, 21 U.S.C. section 360bbb-3(b)(1), unless the authorization is terminated or revoked sooner. Performed at [facility], [address], [place], [place] [address]   Surgical pcr screen     Status: None   Collection Time: 10/17/19  8:59 AM   Specimen: Nasal Mucosa; Nasal Swab  Result Value Ref Range Status   MRSA, PCR NEGATIVE NEGATIVE Final   Staphylococcus aureus NEGATIVE NEGATIVE Final    Comment: (NOTE) The Xpert SA Assay  (FDA approved for NASAL specimens in patients 81 years of age and older), is one component of a comprehensive surveillance program. It is not intended to diagnose infection nor to guide or monitor treatment. Performed at [facility], [address], [place], [place] [address]          Radiology Studies: CT HEAD WO CONTRAST  Result Date: 10/17/2019 CLINICAL DATA:  Altered mental status with delirium EXAM: CT HEAD WITHOUT CONTRAST TECHNIQUE: Contiguous axial images were obtained from the base of the skull through the vertex without intravenous contrast. COMPARISON:  None. FINDINGS: Brain: There is mild diffuse atrophy. There is no intracranial mass, hemorrhage, extra-axial fluid collection, or midline shift. There is evidence of a prior infarct in the posterior right frontal lobe extending to the level of the right sylvian fissure. Elsewhere there is slight small vessel disease in the centra semiovale bilaterally. No acute infarct is evident. Vascular: No hyperdense vessel. There is calcification in each distal vertebral artery and carotid siphon region. Skull: The bony calvarium appears intact. Sinuses/Orbits: There is mucosal thickening in several ethmoid air cells. Other visualized paranasal sinuses are clear. Orbits appear symmetric bilaterally. Other: Mastoid air cells are clear. IMPRESSION: Mild atrophy. Prior focal infarct involving a portion of the posterior right frontal lobe. Mild periventricular small vessel disease. No acute infarct. No mass or hemorrhage. There are multiple foci of arterial vascular calcification. There is mucosal thickening in several ethmoid air cells. Electronically Signed   By: Bretta Bang III M.D.   On: 10/17/2019 13:30   ECHOCARDIOGRAM COMPLETE  Result Date: 10/16/2019    ECHOCARDIOGRAM REPORT   Patient Name:   Jahnaya G [name] Date of Exam: 10/16/2019 Medical Rec #:  [id number]     Height:       62.0 in Accession #:    [id number]    Weight:       148.1  lb Date of Birth:  Oct 01, 1940     BSA:          1.683 m Patient Age:    79 years      BP:           152/88 mmHg Patient Gender: F             HR:           65 bpm. Exam Location:  Inpatient Procedure: 2D Echo, Cardiac Doppler and Color Doppler Indications:    I50.23 Acute on chronic systolic (congestive) heart  failure  History:        Patient has no prior history of Echocardiogram examinations.                 Pacemaker, Stroke, Arrythmias:Atrial Fibrillation; Risk                 Factors:Current Smoker and Hypertension.  Sonographer:    Elmarie Shiley Dance Referring Phys: [id number] ARSHAD N KAKRAKANDY IMPRESSIONS  1. Left ventricular ejection fraction, by estimation, is 50%. The left ventricle has mildly decreased function. The left ventricle demonstrates global hypokinesis. There is mild left ventricular hypertrophy. Left ventricular diastolic parameters are indeterminate. Elevated left ventricular end-diastolic pressure.  2. Right ventricular systolic function is normal. The right ventricular size is normal. There is mildly elevated pulmonary artery systolic pressure. The estimated right ventricular systolic pressure is 40.2 mmHg.  3. Left atrial size was moderately dilated.  4. Right atrial size was mildly dilated.  5. The mitral valve is abnormal. Mild to moderate mitral valve regurgitation. No evidence of mitral stenosis.  6. Tricuspid valve regurgitation is moderate.  7. The aortic valve is severely calcified with severely reduced leaflet motion. The dimensionless index is 0.17, however LVOT TVI may be underestimated. Overall findings suggest at least moderate-severe aortic valve stenosis. The aortic valve is abnormal. Aortic valve regurgitation is not visualized. Aortic valve mean gradient measures 21.0 mmHg.  8. The inferior vena cava is dilated in size with <50% respiratory variability, suggesting right atrial pressure of 15 mmHg. FINDINGS  Left Ventricle: Left ventricular ejection fraction, by estimation, is 50%.  The left ventricle has mildly decreased function. The left ventricle demonstrates global hypokinesis. The left ventricular internal cavity size was normal in size. There is mild left ventricular hypertrophy. Left ventricular diastolic parameters are indeterminate. Elevated left ventricular end-diastolic pressure. Right Ventricle: The right ventricular size is normal. No increase in right ventricular wall thickness. Right ventricular systolic function is normal. There is mildly elevated pulmonary artery systolic pressure. The tricuspid regurgitant velocity is 2.51  m/s, and with an assumed right atrial pressure of 15 mmHg, the estimated right ventricular systolic pressure is 40.2 mmHg. Left Atrium: Left atrial size was moderately dilated. Right Atrium: Right atrial size was mildly dilated. Pericardium: There is no evidence of pericardial effusion. Mitral Valve: The mitral valve is abnormal. Normal mobility of the mitral valve leaflets. Moderate mitral annular calcification. Mild to moderate mitral valve regurgitation. No evidence of mitral valve stenosis. Tricuspid Valve: The tricuspid valve is normal in structure. Tricuspid valve regurgitation is moderate . No evidence of tricuspid stenosis. Aortic Valve: The aortic valve is severely calcified with severely reduced leaflet motion. The dimensionless index is 0.17, however LVOT TVI may be underestimated. Overall findings suggest at least moderate-severe aortic valve stenosis. The aortic valve is abnormal. Aortic valve regurgitation is not visualized. Aortic valve mean gradient measures 21.0 mmHg. Aortic valve peak gradient measures 31.6 mmHg. Aortic valve area, by VTI measures 0.48 cm. Pulmonic Valve: The pulmonic valve was normal in structure. Pulmonic valve regurgitation is trivial. No evidence of pulmonic stenosis. Aorta: The aortic root is normal in size and structure and the ascending aorta was not well visualized. Venous: The inferior vena cava is dilated in  size with less than 50% respiratory variability, suggesting right atrial pressure of 15 mmHg. IAS/Shunts: No atrial level shunt detected by color flow Doppler. Additional Comments: A pacer wire is visualized in the right atrium and right ventricle.  LEFT VENTRICLE PLAX 2D LVIDd:  3.80 cm  Diastology LVIDs:         3.00 cm  LV e' lateral:   4.33 cm/s LV PW:         1.40 cm  LV E/e' lateral: 20.7 LV IVS:        1.00 cm  LV e' medial:    2.87 cm/s LVOT diam:     1.90 cm  LV E/e' medial:  31.3 LV SV:         31 LV SV Index:   19 LVOT Area:     2.84 cm  RIGHT VENTRICLE            IVC RV Basal diam:  3.00 cm    IVC diam: 2.60 cm RV Mid diam:    2.00 cm RV S prime:     7.09 cm/s TAPSE (M-mode): 1.4 cm LEFT ATRIUM             Index LA diam:        4.60 cm 2.73 cm/m LA Vol (A2C):   74.3 ml 44.16 ml/m LA Vol (A4C):   53.1 ml 31.56 ml/m LA Biplane Vol: 64.3 ml 38.22 ml/m  AORTIC VALVE AV Area (Vmax):    0.51 cm AV Area (Vmean):   0.45 cm AV Area (VTI):     0.48 cm AV Vmax:           281.00 cm/s AV Vmean:          203.500 cm/s AV VTI:            0.644 m AV Peak Grad:      31.6 mmHg AV Mean Grad:      21.0 mmHg LVOT Vmax:         51.00 cm/s LVOT Vmean:        32.400 cm/s LVOT VTI:          0.110 m LVOT/AV VTI ratio: 0.17  AORTA Ao Root diam: 3.20 cm Ao Asc diam:  3.70 cm MITRAL VALVE               TRICUSPID VALVE MV Area (PHT): 1.83 cm    TR Peak grad:   25.2 mmHg MV Decel Time: 414 msec    TR Vmax:        251.00 cm/s MV E velocity: 89.75 cm/s                            SHUNTS                            Systemic VTI:  0.11 m                            Systemic Diam: 1.90 cm Weston Brass MD Electronically signed by Weston Brass MD Signature Date/Time: 10/16/2019/10:40:43 PM    Final         Scheduled Meds: . amLODipine  2.5 mg Oral Daily  . apixaban  5 mg Oral BID  . citalopram  10 mg Oral Daily  . famotidine  10 mg Oral Daily  . furosemide  20 mg Oral Daily  . lactulose  20 g Oral TID  .  magnesium oxide  400 mg Oral Daily  . metoprolol succinate  50 mg Oral Daily  . potassium chloride  20 mEq Oral Daily  . propafenone  150 mg Oral  Daily  . rOPINIRole  1 mg Oral QHS   Continuous Infusions:   LOS: 3 days    Time spent: 35 minutes.     Elmarie Shiley, MD [facility]   If 7PM-7AM, please contact night-coverage [email or web address]  10/18/2019, 12:10 PM

## 2019-10-18 NOTE — Evaluation (Signed)
Clinical/Bedside Swallow Evaluation Patient Details  Name: Kaitlyn [name] MRN: [id number] Date of Birth: [birth year]  Today's Date: 10/18/2019 Time: SLP Start Time (ACUTE ONLY): 8469 SLP Stop Time (ACUTE ONLY): 0850 SLP Time Calculation (min) (ACUTE ONLY): 16 min  Past Medical History:  Past Medical History:  Diagnosis Date  . A-fib (HCC)   . Hypertension   . Presence of permanent cardiac pacemaker   . Stroke [facility])    Past Surgical History:  Past Surgical History:  Procedure Laterality Date  . CERVICAL SPINE SURGERY    . PACEMAKER IMPLANT    . shoulder replacement Left    HPI:  Pt is a 79 y.o. female with history of A. fib, pacemaker placement, recent stroke in January 2021 with some speech difficulties, grade 2 diastolic dysfunction with severe aortic stenosis per the notes in care everywhere has been experiencing increasing shortness of breath over the last 3 days with lower extremity edema. CXR: Cardiomegaly and small pleural effusions. Interstitial thickening suspicious for pulmonary edema. Findings suspicious for CHF. Hiatal hernia. Per MD's office note on 08/15/2019, pt had MBS and was "noted to aspirate certain consistencies so started on pure diet nectar thick liquids. Patient reports currently taking pills whole and drinking thin liquids. Patient performing chin tuck when swallowing." 06/16/2019 CT head revealed "interval development of a wedge-shaped infarct in the anterior right MCA territory involving the inferior and middle frontal gyri, frontal operculum, the anterior suprainsular cortex, and subtending white matter. No hemorrhage."   Assessment / Plan / Recommendation Clinical Impression  Pt presents with functional oropharyngeal swallow with noted mild left sensory deficits.  Mastication and oral control of regular solids and thin liquids is normal.  Swallow is brisk.  There are no overt s/s of aspiration.  Pt reports significant improvements in the last several months.   Notably, she has difficulty following commands, demonstrates oral apraxia, and language consists of semantic paraphasias.  She reports that she is left-handed.  She is likely right-brain dominant for language, explaining her aphasia in the context of an anterior right MCA stroke.  Recommend that pt continue SLP services for communication in the next venue of care.  She will not need f/u for swallowing - dysphagia has resolved. Continue regular solids, thin liquids. SLP to sign off.  SLP Visit Diagnosis: Dysphagia, unspecified (R13.10)    Aspiration Risk  No limitations    Diet Recommendation   regular solids, thin liquids  Medication Administration: Whole meds with liquid    Other  Recommendations     Follow up Recommendations   aphasia/communication therapy in next venue of care      Frequency and Duration            Prognosis        Swallow Study   General Date of Onset: 10/16/19 HPI: Pt is a 79 y.o. female with history of A. fib, pacemaker placement, recent stroke in January 2021 with some speech difficulties, grade 2 diastolic dysfunction with severe aortic stenosis per the notes in care everywhere has been experiencing increasing shortness of breath over the last 3 days with lower extremity edema. CXR: Cardiomegaly and small pleural effusions. Interstitial thickening suspicious for pulmonary edema. Findings suspicious for CHF. Hiatal hernia. Per MD's office note on 08/15/2019, pt had MBS and was "noted to aspirate certain consistencies so started on pure diet nectar thick liquids. Patient reports currently taking pills whole and drinking thin liquids. Patient performing chin tuck when swallowing." 06/16/2019 CT head revealed "interval development  of a wedge-shaped infarct in the anterior right MCA territory involving the inferior and middle frontal gyri, frontal operculum, the anterior suprainsular cortex, and subtending white matter. No hemorrhage." Type of Study: Bedside Swallow  Evaluation Previous Swallow Assessment: see HPI Diet Prior to this Study: Regular;Thin liquids Temperature Spikes Noted: No Respiratory Status: Room air History of Recent Intubation: No Behavior/Cognition: Alert;Cooperative Oral Cavity Assessment: Within Functional Limits Oral Care Completed by SLP: No Oral Cavity - Dentition: Adequate natural dentition Vision: Functional for self-feeding Self-Feeding Abilities: Able to feed self Patient Positioning: Upright in bed Baseline Vocal Quality: Normal Volitional Cough: Cognitively unable to elicit Volitional Swallow: Unable to elicit    Oral/Motor/Sensory Function Overall Oral Motor/Sensory Function: Mild impairment(sensory deficits on on left)   Ice Chips Ice chips: Within functional limits   Thin Liquid Thin Liquid: Within functional limits    Nectar Thick Nectar Thick Liquid: Not tested   Honey Thick Honey Thick Liquid: Not tested   Puree Puree: Within functional limits   Solid     Solid: Within functional limits      Kaitlyn [name] 10/18/2019,8:52 AM  [name], [place] Office number [phone] Pager [phone]

## 2019-10-19 ENCOUNTER — Inpatient Hospital Stay (HOSPITAL_COMMUNITY): Payer: Medicare Other

## 2019-10-19 DIAGNOSIS — R4701 Aphasia: Secondary | ICD-10-CM

## 2019-10-19 LAB — HEPATITIS PANEL, ACUTE
HCV Ab: NONREACTIVE
Hep A IgM: NONREACTIVE
Hep B C IgM: NONREACTIVE
Hepatitis B Surface Ag: NONREACTIVE

## 2019-10-19 LAB — BASIC METABOLIC PANEL
Anion gap: 11 (ref 5–15)
BUN: 6 mg/dL — ABNORMAL LOW (ref 8–23)
CO2: 26 mmol/L (ref 22–32)
Calcium: 8.9 mg/dL (ref 8.9–10.3)
Chloride: 104 mmol/L (ref 98–111)
Creatinine, Ser: 0.74 mg/dL (ref 0.44–1.00)
GFR calc Af Amer: >60 mL/min (ref >60)
GFR calc non Af Amer: >60 mL/min (ref >60)
Glucose, Bld: 100 mg/dL — ABNORMAL HIGH (ref 70–99)
Potassium: 3.8 mmol/L (ref 3.5–5.1)
Sodium: 141 mmol/L (ref 135–145)

## 2019-10-19 MED ORDER — AMLODIPINE BESYLATE 5 MG PO TABS
5.0000 mg | ORAL_TABLET | Freq: Every day | ORAL | Status: DC
  Administered 2019-10-19 – 2019-10-20 (×2): 5 mg via ORAL
  Filled 2019-10-19 (×2): qty 1

## 2019-10-19 NOTE — Progress Notes (Signed)
PROGRESS NOTE    Kaitlyn [name]  ZOX:[id number] DOB: [birth year] DOA: 10/15/2019 PCP: Patient, No Pcp Per   Brief Narrative: 79 year old with past medical history significant for A. fib, status post pacemaker, recent stroke January 2021 10 in dysarthria, grade 2 diastolic dysfunction, severe aortic stenosis, who presents complaining of worsening shortness of breath over the last 3 days prior to admission and lower extremity edema.  Evaluation in the ED chest x-ray showed congestion and pleural effusion, BNP 1300, troponin 31/29.  Covid test negative, EKG shows paced rhythm.  Patient has been admitted for heart failure exacerbation.    Assessment & Plan:   Principal Problem:   Acute exacerbation of CHF (congestive heart failure) (HCC) Active Problems:   PAF (paroxysmal atrial fibrillation) (HCC)   Essential hypertension   History of ischemic stroke   Acute CHF (congestive heart failure) (HCC)  1-Acute on chronic diastolic heart failure exacerbation, severe aortic stenosis: -Patient presented with worsening shortness of breath, chest x-ray with pulmonary edema, BNP elevated at 1300. -She has received 3 doses of IV lasix. Discussed with cardiology, plan to transition to oral lasix.  -Weight; 152---148--144 -Output 3.9 L. -stable.   2-Paroxysmal A. fib:  Status post pacemaker, cardiology will interrogate pacer and evaluate battery. Pacemaker was exchange. 5/21. Continue with apixaban and metoprolol. On propafenone.  3-Recent stroke Speech difficulties Continue with apixaban Still with aphasia and confusion, ammonia decreased.  EEG ordered.  MRI wont be able to be done until Monday.   4-History of anxiety or depression: Continue with citalopram.  5-History of GI  bleed; monitor on Eliquis.   6-Aortic stenosis, moderate to severe ECHO from [facility]. ECHO repeated here. Cardiology consulted.  7-Acute hepatic encephalopathy;  More confuse. Per husband patient has been confuse  since the stroke.   ammonia ; elevated at 90 --19 Started  lactulose.   B 12 level normal.  Repeated CT head; Mild atrophy. Prior focal infarct involving a portion of the posterior right frontal lobe. Mild periventricular small vessel disease. No acute infarct. No mass or hemorrhage. Check MRI and EEG.   7-Elevation ammonia; [place]; gallstone gallbladder neck, no evidence of obstruction, sign of hepatic steatosis or fibrosis. Might have some underline cirrhosis. Check Hepatitis panel. Will refer her to GI, [facility] out patient.  LFT trend down.   Estimated body mass index is 25.57 kg/m as calculated from the following:   Height as of this encounter: 5\' 2"  (1.575 m).   Weight as of this encounter: 63.4 kg.   DVT prophylaxis: Apixaban Code Status: Full code Family Communication: care discussed with patient. Husband updated over phone Disposition Plan:  Status is: Inpatient  Remains inpatient appropriate because:Ongoing diagnostic testing needed not appropriate for outpatient work up   Dispo: The patient is from: Home              Anticipated d/c is to: Home              Anticipated d/c date is: 1 day              Patient currently is not medically stable to d/c.     Consultants:   Cardiology   Procedures:   None  Antimicrobials:  None  Subjective: She appears less confuse today.  Following commands.   Objective: Vitals:   10/18/19 1140 10/18/19 2122 10/19/19 0647 10/19/19 0701  BP: (!) 159/81 (!) 178/93  (!) 160/104  Pulse: (!) 59 60  (!) 59  Resp: 16 17  18  Temp: (!) 97.3 F (36.3 C) (!) 97.3 F (36.3 C)  97.7 F (36.5 C)  TempSrc: Oral Oral  Oral  SpO2: 96% 98%  98%  Weight:   63.4 kg   Height:        Intake/Output Summary (Last 24 hours) at 10/19/2019 1122 Last data filed at 10/19/2019 0940 Gross per 24 hour  Intake 360 ml  Output 450 ml  Net -90 ml   Filed Weights   10/17/19 0059 10/18/19 0034 10/19/19 0647  Weight: 65.7 kg 64.5 kg 63.4 kg     Examination:  General exam: NAD Respiratory system: CTA Cardiovascular system: S 1, S 2 IRR Gastrointestinal system: BS present, soft, nt Central nervous system: Alert, follows command Extremities: no edema Skin: no rashes   Data Reviewed: I have personally reviewed following labs and imaging studies  CBC: Recent Labs  Lab 10/15/19 1337 10/16/19 0455 10/17/19 0504 10/18/19 0720  WBC 7.6 6.9 8.6 6.8  NEUTROABS 5.6 4.2  --   --   HGB 13.9 13.3 13.7 13.2  HCT 44.1 41.8 43.5 42.7  MCV 88.2 87.4 87.5 88.6  PLT 265 249 269 256   Basic Metabolic Panel: Recent Labs  Lab 10/15/19 1337 10/16/19 0455 10/17/19 0504 10/18/19 0602 10/19/19 0802  NA 138 141 141 142 141  K 4.4 3.5 3.7 4.0 3.8  CL 102 104 105 103 104  CO2 28 28 26 30 26   GLUCOSE 107* 111* 110* 93 100*  BUN 16 19 20 15  6*  CREATININE 0.85 0.89 0.95 0.92 0.74  CALCIUM 9.1 9.4 9.1 8.8* 8.9  MG  --  1.7  --   --   --    GFR: Estimated Creatinine Clearance: 51.5 mL/min (by C-G formula based on SCr of 0.74 mg/dL). Liver Function Tests: Recent Labs  Lab 10/17/19 1445 10/18/19 0720  AST 36 38  ALT 45* 35  ALKPHOS 95 90  BILITOT 2.2* 1.9*  PROT 5.7* 5.6*  ALBUMIN 3.2* 2.9*   No results for input(s): LIPASE, AMYLASE in the last 168 hours. Recent Labs  Lab 10/17/19 1059 10/18/19 0602  AMMONIA 93* 19   Coagulation Profile: No results for input(s): INR, PROTIME in the last 168 hours. Cardiac Enzymes: No results for input(s): CKTOTAL, CKMB, CKMBINDEX, TROPONINI in the last 168 hours. BNP (last 3 results) No results for input(s): PROBNP in the last 8760 hours. HbA1C: No results for input(s): HGBA1C in the last 72 hours. CBG: No results for input(s): GLUCAP in the last 168 hours. Lipid Profile: No results for input(s): CHOL, HDL, LDLCALC, TRIG, CHOLHDL, LDLDIRECT in the last 72 hours. Thyroid Function Tests: No results for input(s): TSH, T4TOTAL, FREET4, T3FREE, THYROIDAB in the last 72  hours. Anemia Panel: Recent Labs    10/17/19 1059  VITAMINB12 840   Sepsis Labs: No results for input(s): PROCALCITON, LATICACIDVEN in the last 168 hours.  Recent Results (from the past 240 hour(s))  SARS Coronavirus 2 by RT PCR (hospital order, performed in [facility] hospital lab) Nasopharyngeal Nasopharyngeal Swab     Status: None   Collection Time: 10/15/19  3:07 PM   Specimen: Nasopharyngeal Swab  Result Value Ref Range Status   SARS Coronavirus 2 NEGATIVE NEGATIVE Final    Comment: (NOTE) SARS-CoV-2 target nucleic acids are NOT DETECTED. The SARS-CoV-2 RNA is generally detectable in upper and lower respiratory specimens during the acute phase of infection. The lowest concentration of SARS-CoV-2 viral copies this assay can detect is 250 copies / mL. A negative  result does not preclude SARS-CoV-2 infection and should not be used as the sole basis for treatment or other patient management decisions.  A negative result may occur with improper specimen collection / handling, submission of specimen other than nasopharyngeal swab, presence of viral mutation(s) within the areas targeted by this assay, and inadequate number of viral copies (<250 copies / mL). A negative result must be combined with clinical observations, patient history, and epidemiological information. Fact Sheet for Patients:   BoilerBrush.com.cy Fact Sheet for Healthcare Providers: https://pope.com/ This test is not yet approved or cleared  by the [place] FDA and has been authorized for detection and/or diagnosis of SARS-CoV-2 by FDA under an Emergency Use Authorization (EUA).  This EUA will remain in effect (meaning this test can be used) for the duration of the COVID-19 declaration under Section 564(b)(1) of the Act, 21 U.S.C. section 360bbb-3(b)(1), unless the authorization is terminated or revoked sooner. Performed at [facility], [address] [place], [place] [address]   Surgical pcr screen     Status: None   Collection Time: 10/17/19  8:59 AM   Specimen: Nasal Mucosa; Nasal Swab  Result Value Ref Range Status   MRSA, PCR NEGATIVE NEGATIVE Final   Staphylococcus aureus NEGATIVE NEGATIVE Final    Comment: (NOTE) The Xpert SA Assay (FDA approved for NASAL specimens in patients 68 years of age and older), is one component of a comprehensive surveillance program. It is not intended to diagnose infection nor to guide or monitor treatment. Performed at [facility], [address], [place], [place] [address]          Radiology Studies: CT HEAD WO CONTRAST  Result Date: 10/17/2019 CLINICAL DATA:  Altered mental status with delirium EXAM: CT HEAD WITHOUT CONTRAST TECHNIQUE: Contiguous axial images were obtained from the base of the skull through the vertex without intravenous contrast. COMPARISON:  None. FINDINGS: Brain: There is mild diffuse atrophy. There is no intracranial mass, hemorrhage, extra-axial fluid collection, or midline shift. There is evidence of a prior infarct in the posterior right frontal lobe extending to the level of the right sylvian fissure. Elsewhere there is slight small vessel disease in the centra semiovale bilaterally. No acute infarct is evident. Vascular: No hyperdense vessel. There is calcification in each distal vertebral artery and carotid siphon region. Skull: The bony calvarium appears intact. Sinuses/Orbits: There is mucosal thickening in several ethmoid air cells. Other visualized paranasal sinuses are clear. Orbits appear symmetric bilaterally. Other: Mastoid air cells are clear. IMPRESSION: Mild atrophy. Prior focal infarct involving a portion of the posterior right frontal lobe. Mild periventricular small vessel disease. No acute infarct. No mass or hemorrhage. There are multiple foci of arterial vascular calcification. There is mucosal thickening in several ethmoid air cells.  Electronically Signed   By: Bretta Bang III M.D.   On: 10/17/2019 13:30   US Abdomen Limited RUQ  Result Date: 10/18/2019 CLINICAL DATA:  Abnormal transaminases. EXAM: ULTRASOUND ABDOMEN LIMITED RIGHT UPPER QUADRANT COMPARISON:  None. FINDINGS: Gallbladder: There is a non mobile stone in the gallbladder neck measuring 2.3 cm. There is gallbladder sludge. Borderline gallbladder wall thickening of 3 mm. The sonographic Murphy's sign is negative. No pericholecystic fluid. Common bile duct: Diameter: 5.1 mm Liver: No focal lesion identified. Increased parenchymal echogenicity. Portal vein is patent on color Doppler imaging with normal direction of blood flow towards the liver. Other: Exophytic right renal cyst measures 6.2 x 5.5 x 6.1 cm. Small right  pleural effusion. IMPRESSION: None mobile 2.3 cm gallstone within the gallbladder neck with nonspecific borderline gallbladder wall thickening. No definite evidence of acute cholecystitis. Diffusely increased parenchymal echogenicity of the liver, usually associated with hepatic steatosis or fibrosis. Electronically Signed   By: Fidela Salisbury M.D.   On: 10/18/2019 15:34        Scheduled Meds: . amLODipine  5 mg Oral Daily  . apixaban  5 mg Oral BID  . citalopram  10 mg Oral Daily  . famotidine  10 mg Oral Daily  . furosemide  20 mg Oral Daily  . lactulose  20 g Oral BID  . magnesium oxide  400 mg Oral Daily  . metoprolol succinate  50 mg Oral Daily  . potassium chloride  20 mEq Oral Daily  . propafenone  150 mg Oral Daily  . rOPINIRole  1 mg Oral QHS   Continuous Infusions:   LOS: 4 days    Time spent: 35 minutes.     Elmarie Shiley, MD [facility]   If 7PM-7AM, please contact night-coverage [email or web address]  10/19/2019, 11:22 AM

## 2019-10-19 NOTE — Evaluation (Signed)
Occupational Therapy Evaluation Patient Details Name: Kaitlyn [name] [place] MRN: [id number] DOB: Mar 06, 1941 Today's Date: 10/19/2019    History of Present Illness 79 y.o female admitted with SOB. PMH includes A. fib, pacemaker placement, recent stroke in January 2021 with some speech difficulties, grade 2 diastolic dysfunction with severe aortic stenosis   Clinical Impression   Pt admitted with above diagnoses, unsure of exact PLOF due to pt being poor historian. She reports being independent for BADL/IADL. At time of eval, pt completed bed mobility at min guard and sit <> stands with supervision. Pt then completed functional mobility to bathroom and completed toileting without assist. Overall, pt requires supervision due to cognitive deficits. Noted deficits in STM, safety, awareness, and problem solving. Recommend pt have 24/7 supervision at home, no post acute OT follow up necessary. Will continue to follow per POC listed below.    Follow Up Recommendations  No OT follow up;Supervision/Assistance - 24 hour    Equipment Recommendations  None recommended by OT    Recommendations for Other Services       Precautions / Restrictions Precautions Precautions: Fall Restrictions Weight Bearing Restrictions: No      Mobility Bed Mobility Overal bed mobility: Needs Assistance Bed Mobility: Supine to Sit     Supine to sit: Min guard     General bed mobility comments: increased time and guarding for safety  Transfers Overall transfer level: Needs assistance Equipment used: None Transfers: Sit to/from Stand Sit to Stand: Supervision              Balance Overall balance assessment: Mild deficits observed, not formally tested                                         ADL either performed or assessed with clinical judgement   ADL Overall ADL's : Needs assistance/impaired Eating/Feeding: Set up;Sitting   Grooming: Supervision/safety;Standing;Oral care;Wash/dry  hands   Upper Body Bathing: Set up;Sitting   Lower Body Bathing: Set up;Sitting/lateral leans;Sit to/from stand   Upper Body Dressing : Set up;Sitting   Lower Body Dressing: Sitting/lateral leans;Sit to/from stand;Min guard   Toilet Transfer: Min guard;Regular Toilet;Grab bars   Toileting- Architect and Hygiene: Set up;Sitting/lateral lean;Sit to/from stand Toileting - Clothing Manipulation Details (indicate cue type and reason): anterior peri care Tub/ Shower Transfer: Min guard;Ambulation;Shower seat   Functional mobility during ADLs: Min guard;Cueing for safety       Vision Baseline Vision/History: Wears glasses Wears Glasses: At all times Patient Visual Report: No change from baseline       Perception     Praxis      Pertinent Vitals/Pain Pain Assessment: No/denies pain     Hand Dominance     Extremity/Trunk Assessment Upper Extremity Assessment Upper Extremity Assessment: Overall WFL for tasks assessed   Lower Extremity Assessment Lower Extremity Assessment: Defer to PT evaluation       Communication Communication Communication: Expressive difficulties   Cognition Arousal/Alertness: Awake/alert Behavior During Therapy: WFL for tasks assessed/performed Overall Cognitive Status: History of cognitive impairments - at baseline Area of Impairment: Orientation;Memory;Safety/judgement;Awareness;Problem solving                 Orientation Level: Disoriented to;Situation   Memory: Decreased short-term memory   Safety/Judgement: Decreased awareness of safety;Decreased awareness of deficits Awareness: Emergent Problem Solving: Slow processing;Requires verbal cues;Requires tactile cues General Comments: per chart review husband has reported  incrased confusion since stroke. Pt was giving home hx and unsure if this was all accurate due to pt being poor historian. Was not able to dial number on phone despite number being written down in front of  her   General Comments  VSS on RA    Exercises     Shoulder Instructions      Home Living Family/patient expects to be discharged to:: Private residence Living Arrangements: Spouse/significant other Available Help at Discharge: Family;Available PRN/intermittently Type of Home: Other(Comment)(lives in double wide trailer- "house is my office") Home Access: Stairs to enter     Home Layout: Multi-level Alternate Level Stairs-Number of Steps: 3 level trailer (?)   Bathroom Shower/Tub: Teacher, early years/pre: Standard     Home Equipment: Shower seat - built in   Additional Comments: Pt is confused, unsure of accuracy of information above      Prior Functioning/Environment          Comments: unsure, no family present. Per chart review has been increasingly confused as of late. Pt reports herself as independent        OT Problem List: Decreased knowledge of use of DME or AE;Decreased activity tolerance;Decreased cognition;Cardiopulmonary status limiting activity;Impaired balance (sitting and/or standing);Decreased safety awareness      OT Treatment/Interventions: Self-care/ADL training;Therapeutic exercise;Patient/family education;Balance training;Energy conservation;Therapeutic activities;DME and/or AE instruction;Cognitive remediation/compensation    OT Goals(Current goals can be found in the care plan section) Acute Rehab OT Goals Patient Stated Goal: return home OT Goal Formulation: With patient Time For Goal Achievement: 11/02/19 Potential to Achieve Goals: Good  OT Frequency: Min 2X/week   Barriers to D/C:            Co-evaluation              AM-PAC OT "6 Clicks" Daily Activity     Outcome Measure Help from another person eating meals?: None Help from another person taking care of personal grooming?: None Help from another person toileting, which includes using toliet, bedpan, or urinal?: A Little Help from another person bathing  (including washing, rinsing, drying)?: A Little Help from another person to put on and taking off regular upper body clothing?: None Help from another person to put on and taking off regular lower body clothing?: A Little 6 Click Score: 21   End of Session Nurse Communication: Mobility status  Activity Tolerance: Patient tolerated treatment well Patient left: in chair;with call bell/phone within reach  OT Visit Diagnosis: Other abnormalities of gait and mobility (R26.89);Unsteadiness on feet (R26.81);Other symptoms and signs involving cognitive function                Time: 1545-1600 OT Time Calculation (min): 15 min Charges:  OT General Charges $OT Visit: 1 Visit OT Evaluation $OT Eval Moderate Complexity: [place], MSOT, OTR/L [place] [facility] Office Number: [phone] Pager: [phone]  Zenovia Jarred 10/19/2019, 5:38 PM

## 2019-10-19 NOTE — Procedures (Signed)
ELECTROENCEPHALOGRAM REPORT   Patient: Kaitlyn [name]       Room #: 3E11C EEG No. ID: [id number] Age: 79 y.o.        Sex: female Requesting Physician: Regalado Report Date:  10/19/2019        Interpreting Physician: Thana Farr  History: [name] is an 79 y.o. female with aphasia and confusion  Medications:  Norvasc, Eliquis, Celexa, Lasix, Lactulose, Mag-Ox, Metoprolol, Rythmol, Requip  Conditions of Recording:  This is a 21 channel routine scalp EEG performed with bipolar and monopolar montages arranged in accordance to the international 10/20 system of electrode placement. One channel was dedicated to EKG recording.  The patient is in the awake state.  Description:  The background is slow and poorly organized.  It consists of low voltage, irregular activity in the delta-theta continuum.  This activity is diffusely distributed and continuous.   No epileptiform activity is noted.   Hyperventilation and intermittent photic stimulation were not performed.   IMPRESSION: This is an abnormal EEG secondary to general background slowing.  This finding may be seen with a diffuse disturbance that is etiologically nonspecific, but may include a metabolic encephalopathy, among other possibilities.  No epileptiform activity was noted.     Thana Farr, MD Neurology [phone] 10/19/2019, 8:39 PM

## 2019-10-19 NOTE — Progress Notes (Signed)
EEG complete - results pending 

## 2019-10-19 NOTE — Discharge Instructions (Signed)
Wound care instructions (pacemaker) Keep incision clean and dry for 10 days. No driving for 2 days.  You can remove outer dressing tomorrow. Leave steri-strips (little pieces of tape) on until seen in the office for wound check appointment. Call the office [phone]) for redness, drainage, swelling, or fever.   ================================================================================================================= Information on my medicine - ELIQUIS (apixaban)  This medication education was reviewed with me or my healthcare representative as part of my discharge preparation.   Why was Eliquis prescribed for you? Eliquis was prescribed for you to reduce the risk of a blood clot forming that can cause a stroke if you have a medical condition called atrial fibrillation (a type of irregular heartbeat).  What do You need to know about Eliquis ? Take your Eliquis TWICE DAILY - one tablet in the morning and one tablet in the evening with or without food. If you have difficulty swallowing the tablet whole please discuss with your pharmacist how to take the medication safely.  Take Eliquis exactly as prescribed by your doctor and DO NOT stop taking Eliquis without talking to the doctor who prescribed the medication.  Stopping may increase your risk of developing a stroke.  Refill your prescription before you run out.  After discharge, you should have regular check-up appointments with your healthcare provider that is prescribing your Eliquis.  In the future your dose may need to be changed if your kidney function or weight changes by a significant amount or as you get older.  What do you do if you miss a dose? If you miss a dose, take it as soon as you remember on the same day and resume taking twice daily.  Do not take more than one dose of ELIQUIS at the same time to make up a missed dose.  Important Safety Information A possible side effect of Eliquis is bleeding. You should  call your healthcare provider right away if you experience any of the following: ? Bleeding from an injury or your nose that does not stop. ? Unusual colored urine (red or dark brown) or unusual colored stools (red or black). ? Unusual bruising for unknown reasons. ? A serious fall or if you hit your head (even if there is no bleeding).  Some medicines may interact with Eliquis and might increase your risk of bleeding or clotting while on Eliquis. To help avoid this, consult your healthcare provider or pharmacist prior to using any new prescription or non-prescription medications, including herbals, vitamins, non-steroidal anti-inflammatory drugs (NSAIDs) and supplements.  This website has more information on Eliquis (apixaban): http://www.eliquis.com/eliquis/home

## 2019-10-19 NOTE — Progress Notes (Signed)
Progress Note  Patient Name: Kaitlyn [name] Date of Encounter: 10/19/2019  Primary Cardiologist:   [name], MD   Subjective   Confused but pleasant.  No pain or SOB  Inpatient Medications    Scheduled Meds: . amLODipine  2.5 mg Oral Daily  . apixaban  5 mg Oral BID  . citalopram  10 mg Oral Daily  . famotidine  10 mg Oral Daily  . furosemide  20 mg Oral Daily  . lactulose  20 g Oral BID  . magnesium oxide  400 mg Oral Daily  . metoprolol succinate  50 mg Oral Daily  . potassium chloride  20 mEq Oral Daily  . propafenone  150 mg Oral Daily  . rOPINIRole  1 mg Oral QHS   Continuous Infusions:  PRN Meds: clonazePAM   Vital Signs    Vitals:   10/18/19 1140 10/18/19 2122 10/19/19 0647 10/19/19 0701  BP: (!) 159/81 (!) 178/93  (!) 160/104  Pulse: (!) 59 60  (!) 59  Resp: 16 17  18   Temp: (!) 97.3 F (36.3 C) (!) 97.3 F (36.3 C)  97.7 F (36.5 C)  TempSrc: Oral Oral  Oral  SpO2: 96% 98%  98%  Weight:   63.4 kg   Height:        Intake/Output Summary (Last 24 hours) at 10/19/2019 0754 Last data filed at 10/19/2019 10/21/2019 Gross per 24 hour  Intake 460 ml  Output 450 ml  Net 10 ml   Filed Weights   10/17/19 0059 10/18/19 0034 10/19/19 0647  Weight: 65.7 kg 64.5 kg 63.4 kg    Telemetry    Atrial paced demand pacing - Personally Reviewed  ECG    NA - Personally Reviewed  Physical Exam   GEN: No  acute distress.   Neck: No  JVD Cardiac: RRR, 3/6 apical systolic murmurs, rubs, or gallops.  Respiratory:     Dependent crackles GI: Soft, nontender, non-distended, normal bowel sounds  MS:   No edema; No deformity. Neuro:   Nonfocal  Psych:   Baseline confusion.  Chest:  Pressure dressing clean and dry.   Labs    Chemistry Recent Labs  Lab 10/16/19 0455 10/17/19 0504 10/17/19 1445 10/18/19 0602 10/18/19 0720  NA 141 141  --  142  --   K 3.5 3.7  --  4.0  --   CL 104 105  --  103  --   CO2 28 26  --  30  --   GLUCOSE 111* 110*  --  93  --    BUN 19 20  --  15  --   CREATININE 0.89 0.95  --  0.92  --   CALCIUM 9.4 9.1  --  8.8*  --   PROT  --   --  5.7*  --  5.6*  ALBUMIN  --   --  3.2*  --  2.9*  AST  --   --  36  --  38  ALT  --   --  45*  --  35  ALKPHOS  --   --  95  --  90  BILITOT  --   --  2.2*  --  1.9*  GFRNONAA >60 58*  --  >60  --   GFRAA >60 >60  --  >60  --   ANIONGAP 9 10  --  9  --      Hematology Recent Labs  Lab 10/16/19 0455 10/17/19 0504 10/18/19  0720  WBC 6.9 8.6 6.8  RBC 4.78 4.97 4.82  HGB 13.3 13.7 13.2  HCT 41.8 43.5 42.7  MCV 87.4 87.5 88.6  MCH 27.8 27.6 27.4  MCHC 31.8 31.5 30.9  RDW 15.8* 15.9* 15.6*  PLT 249 269 256    Cardiac EnzymesNo results for input(s): TROPONINI in the last 168 hours. No results for input(s): TROPIPOC in the last 168 hours.   BNP Recent Labs  Lab 10/15/19 1338  BNP 1,346.3*     DDimer No results for input(s): DDIMER in the last 168 hours.   Radiology    CT HEAD WO CONTRAST  Result Date: 10/17/2019 CLINICAL DATA:  Altered mental status with delirium EXAM: CT HEAD WITHOUT CONTRAST TECHNIQUE: Contiguous axial images were obtained from the base of the skull through the vertex without intravenous contrast. COMPARISON:  None. FINDINGS: Brain: There is mild diffuse atrophy. There is no intracranial mass, hemorrhage, extra-axial fluid collection, or midline shift. There is evidence of a prior infarct in the posterior right frontal lobe extending to the level of the right sylvian fissure. Elsewhere there is slight small vessel disease in the centra semiovale bilaterally. No acute infarct is evident. Vascular: No hyperdense vessel. There is calcification in each distal vertebral artery and carotid siphon region. Skull: The bony calvarium appears intact. Sinuses/Orbits: There is mucosal thickening in several ethmoid air cells. Other visualized paranasal sinuses are clear. Orbits appear symmetric bilaterally. Other: Mastoid air cells are clear. IMPRESSION: Mild  atrophy. Prior focal infarct involving a portion of the posterior right frontal lobe. Mild periventricular small vessel disease. No acute infarct. No mass or hemorrhage. There are multiple foci of arterial vascular calcification. There is mucosal thickening in several ethmoid air cells. Electronically Signed   By: Bretta Bang III M.D.   On: 10/17/2019 13:30   US Abdomen Limited RUQ  Result Date: 10/18/2019 CLINICAL DATA:  Abnormal transaminases. EXAM: ULTRASOUND ABDOMEN LIMITED RIGHT UPPER QUADRANT COMPARISON:  None. FINDINGS: Gallbladder: There is a non mobile stone in the gallbladder neck measuring 2.3 cm. There is gallbladder sludge. Borderline gallbladder wall thickening of 3 mm. The sonographic Murphy's sign is negative. No pericholecystic fluid. Common bile duct: Diameter: 5.1 mm Liver: No focal lesion identified. Increased parenchymal echogenicity. Portal vein is patent on color Doppler imaging with normal direction of blood flow towards the liver. Other: Exophytic right renal cyst measures 6.2 x 5.5 x 6.1 cm. Small right pleural effusion. IMPRESSION: None mobile 2.3 cm gallstone within the gallbladder neck with nonspecific borderline gallbladder wall thickening. No definite evidence of acute cholecystitis. Diffusely increased parenchymal echogenicity of the liver, usually associated with hepatic steatosis or fibrosis. Electronically Signed   By: Ted Mcalpine M.D.   On: 10/18/2019 15:34    Cardiac Studies   Echo:    1. Left ventricular ejection fraction, by estimation, is 50%. The left  ventricle has mildly decreased function. The left ventricle demonstrates  global hypokinesis. There is mild left ventricular hypertrophy. Left  ventricular diastolic parameters are  indeterminate. Elevated left ventricular end-diastolic pressure.  2. Right ventricular systolic function is normal. The right ventricular  size is normal. There is mildly elevated pulmonary artery systolic  pressure.  The estimated right ventricular systolic pressure is 40.2 mmHg.  3. Left atrial size was moderately dilated.  4. Right atrial size was mildly dilated.  5. The mitral valve is abnormal. Mild to moderate mitral valve  regurgitation. No evidence of mitral stenosis.  6. Tricuspid valve regurgitation is moderate.  7.  The aortic valve is severely calcified with severely reduced leaflet  motion. The dimensionless index is 0.17, however LVOT TVI may be  underestimated. 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 The aortic valve is  abnormal. Aortic valve regurgitation is not visualized. Aortic valve mean  gradient measures 21.0 mmHg.  8. The inferior vena cava is dilated in size with <50% respiratory  variability, suggesting right atrial pressure of 15 mmHg.   Patient Profile     79 y.o. female with acute diastolic heart failure, pacemaker at [place] HF:   Transitioned to oral Lasix.  Net negative 4.1 liters.   Continue current therapy.    PACEMAKER   Replaced this admission.  We will arrange EP follow up.  She has a wound check scheduled for June 1st.     PAF:   Resumed Eliquis this AM per Dr Olin Pia note. Marland Kitchen   MODERATE TO SEVERE AS:   Probably on the moderate side.  Will manage clinically.   HTN:  Added Norvasc 2.5 mg daily yesterday.   Will increase that to 5 mg today.    For questions or updates, please contact [place] Please consult [email or web address] for contact info under Cardiology/STEMI.   Signed, Minus Breeding, MD  10/19/2019, 7:54 AM

## 2019-10-19 NOTE — Plan of Care (Signed)

## 2019-10-20 ENCOUNTER — Inpatient Hospital Stay (HOSPITAL_COMMUNITY): Payer: Medicare Other

## 2019-10-20 ENCOUNTER — Telehealth: Payer: Self-pay | Admitting: Cardiovascular Disease

## 2019-10-20 DIAGNOSIS — Z8673 Personal history of transient ischemic attack (TIA), and cerebral infarction without residual deficits: Secondary | ICD-10-CM

## 2019-10-20 DIAGNOSIS — G9341 Metabolic encephalopathy: Secondary | ICD-10-CM

## 2019-10-20 DIAGNOSIS — Z45018 Encounter for adjustment and management of other part of cardiac pacemaker: Secondary | ICD-10-CM

## 2019-10-20 LAB — PROTIME-INR
INR: 1.5 — ABNORMAL HIGH (ref 0.8–1.2)
Prothrombin Time: 17.2 seconds — ABNORMAL HIGH (ref 11.4–15.2)

## 2019-10-20 LAB — BASIC METABOLIC PANEL
Anion gap: 9 (ref 5–15)
BUN: 8 mg/dL (ref 8–23)
CO2: 23 mmol/L (ref 22–32)
Calcium: 8.8 mg/dL — ABNORMAL LOW (ref 8.9–10.3)
Chloride: 107 mmol/L (ref 98–111)
Creatinine, Ser: 0.64 mg/dL (ref 0.44–1.00)
GFR calc Af Amer: >60 mL/min (ref >60)
GFR calc non Af Amer: >60 mL/min (ref >60)
Glucose, Bld: 99 mg/dL (ref 70–99)
Potassium: 3.7 mmol/L (ref 3.5–5.1)
Sodium: 139 mmol/L (ref 135–145)

## 2019-10-20 LAB — APTT: aPTT: 33 seconds (ref 24–36)

## 2019-10-20 MED ORDER — LACTULOSE 10 GM/15ML PO SOLN: 20.0000 g | Freq: Two times a day (BID) | ORAL | 0 refills | Status: DC

## 2019-10-20 MED ORDER — FUROSEMIDE 20 MG PO TABS: 20.0000 mg | ORAL_TABLET | Freq: Every day | ORAL | 0 refills | Status: DC

## 2019-10-20 MED ORDER — AMLODIPINE BESYLATE 2.5 MG PO TABS: 2.5000 mg | ORAL_TABLET | Freq: Every day | ORAL | 0 refills | Status: DC

## 2019-10-20 MED ORDER — AMLODIPINE BESYLATE 10 MG PO TABS: 10.0000 mg | ORAL_TABLET | Freq: Every day | ORAL | Status: DC

## 2019-10-20 MED ORDER — POTASSIUM CHLORIDE CRYS ER 20 MEQ PO TBCR: 20.0000 meq | EXTENDED_RELEASE_TABLET | Freq: Every day | ORAL | 0 refills | Status: DC

## 2019-10-20 MED ORDER — AMLODIPINE BESYLATE 5 MG PO TABS: 5.0000 mg | ORAL_TABLET | Freq: Every day | ORAL | Status: DC

## 2019-10-20 MED ORDER — AMLODIPINE BESYLATE 2.5 MG PO TABS: 2.5000 mg | ORAL_TABLET | Freq: Every day | ORAL | Status: DC

## 2019-10-20 MED ORDER — FAMOTIDINE 10 MG PO TABS: 10.0000 mg | ORAL_TABLET | Freq: Every day | ORAL | 0 refills | Status: DC

## 2019-10-20 MED FILL — Lidocaine HCl Local Inj 1%: INTRAMUSCULAR | Qty: 60 | Status: AC

## 2019-10-20 MED FILL — Heparin Sod (Porcine)-NaCl IV Soln 1000 Unit/500ML-0.9%: INTRAVENOUS | Qty: 500 | Status: AC

## 2019-10-20 NOTE — TOC Progression Note (Signed)
Transition of Care [facility]) - Progression Note    Patient Details  Name: Kaitlyn [name] MRN: [id number] Date of Birth: [birth year]  [facility] of [facility] CM/SW Contact  Leone Haven, RN Phone Number: 10/20/2019, 4:40 PM  Clinical Narrative:    NCM notified Kandee Keen with [facility] that patient is for dc today.     Expected Discharge Plan: Home w Home Health Services Barriers to Discharge: Continued Medical Work up  Expected Discharge Plan and Services Expected Discharge Plan: Home w Home Health Services   Discharge Planning Services: CM Consult Post Acute Care Choice: Home Health Living arrangements for the past 2 months: Single Family Home Expected Discharge Date: 10/20/19                 DME Agency: NA       HH Arranged: RN, PT, Speech Therapy HH Agency: [facility] Date [facility] Agency Contacted: 10/17/19 Time HH Agency Contacted: [phone] Representative spoke with at [facility] Agency: Kandee Keen   Social Determinants of Health (SDOH) Interventions    Readmission Risk Interventions No flowsheet data found.

## 2019-10-20 NOTE — Progress Notes (Signed)
Informed of MRI for today.   Device system confirmed to be MRI conditional and no evidence of abandoned or epicardial leads in review of most recent CXR. Chronic leads in newly implanted generator.   Interrogation from today reviewed, pt is currently AP-VS at 60 bpm Change device settings for MRI to AOO at 75 bpm  Tachy-therapies to off if applicable  Program device back to pre-MRI settings after completion of exam.  Graciella Freer, PA-C  10/20/2019 11:24 AM

## 2019-10-20 NOTE — Evaluation (Signed)
Physical Therapy Evaluation Patient Details Name: Kaitlyn [name] MRN: [id number] DOB: [birth year] Today's Date: 10/20/2019   History of Present Illness  79 y.o female admitted with SOB. PMH includes A. fib, pacemaker placement, recent stroke in January 2021 with some speech difficulties, grade 2 diastolic dysfunction with severe aortic stenosis  Clinical Impression  Pt in bed upon arrival of PT, agreeable to evaluation at this time. The pt is an unreliable historian, but reports she was independent with all mobility and self-care PTA without use of AD. The pt now presents with limitations in functional mobility, stability, and endurance due to above dx, and will continue to benefit from skilled PT to address these deficits. The pt was able to demo good stability with transfers without use of AD or support, but benefits from minG during ambulation due to increased lateral movement and tendency to run into objects on her left side if not given cues to avoid. The pt also has noted deficits in [facility] and safety awareness that limit her ability to make safe decisions regarding mobility without supervision. The pt will continue to benefit from skilled PT to further progress functional mobility and endurance with improved stability prior to anticipated d/c home.       Follow Up Recommendations Home health PT;Supervision for mobility/OOB    Equipment Recommendations  None recommended by PT    Recommendations for Other Services       Precautions / Restrictions Precautions Precautions: Fall Restrictions Weight Bearing Restrictions: No      Mobility  Bed Mobility Overal bed mobility: Needs Assistance Bed Mobility: Supine to Sit     Supine to sit: Modified independent (Device/Increase time)     General bed mobility comments: increased time, but pt able to come to sitting EOB without assist or use of bed rails  Transfers Overall transfer level: Needs assistance Equipment used: None Transfers:  Sit to/from Stand Sit to Stand: Supervision         General transfer comment: supervision for safety, pt able to static stand without assist or instability upon stand  Ambulation/Gait Ambulation/Gait assistance: Min guard Gait Distance (Feet): 150 Feet Assistive device: None Gait Pattern/deviations: Step-through pattern;Decreased stride length;Drifts right/left Gait velocity: 0.47 m/s Gait velocity interpretation: 1.31 - 2.62 ft/sec, indicative of limited community ambulator General Gait Details: Pt with some instances of increased lateral movement/steps. Poor vision with objects on L side, needed verbal cues to avoid objects x3 through session. no LOB but with generally unstable gait  Stairs            Wheelchair Mobility    Modified Rankin (Stroke Patients Only)       Balance Overall balance assessment: Mild deficits observed, not formally tested                                           Pertinent Vitals/Pain Pain Assessment: No/denies pain    Home Living Family/patient expects to be discharged to:: Private residence Living Arrangements: Spouse/significant other(Pt originally reporting she lived with her father and dog, later stating her husband and dog) Available Help at Discharge: Family;Available PRN/intermittently Type of Home: Other(Comment)(double-wide trailer) Home Access: Stairs to enter       Home Equipment: Shower seat - built in Additional Comments: Pt is confused, unsure of accuracy of information above, pt changing answers slightly when asked questions multiple times. Reports she does not use AD  Prior Function           Comments: unsure, no family present. Per chart review has been increasingly confused as of late. Pt reports herself as independent     Hand Dominance        Extremity/Trunk Assessment   Upper Extremity Assessment Upper Extremity Assessment: Overall WFL for tasks assessed    Lower Extremity  Assessment Lower Extremity Assessment: Overall WFL for tasks assessed    Cervical / Trunk Assessment Cervical / Trunk Assessment: Normal  Communication   Communication: Expressive difficulties  Cognition Arousal/Alertness: Awake/alert Behavior During Therapy: WFL for tasks assessed/performed Overall Cognitive Status: History of cognitive impairments - at baseline Area of Impairment: Orientation;Memory;Safety/judgement;Awareness;Problem solving                 Orientation Level: Disoriented to;Situation;Place   Memory: Decreased short-term memory   Safety/Judgement: Decreased awareness of safety;Decreased awareness of deficits Awareness: Emergent Problem Solving: Slow processing;Requires verbal cues;Requires tactile cues General Comments: Pt with increased confusion since stroke, pt with noted STM deficits through session, word-finding difficulties, unreliable historian with home set up and PLF.      General Comments General comments (skin integrity, edema, etc.): VSS on RA    Exercises     Assessment/Plan    PT Assessment Patient needs continued PT services  PT Problem List Decreased mobility;Decreased strength;Decreased coordination;Decreased balance;Decreased safety awareness;Decreased knowledge of precautions       PT Treatment Interventions DME instruction;Therapeutic exercise;Gait training;Stair training;Functional mobility training;Therapeutic activities;Patient/family education;Cognitive remediation;Balance training    PT Goals (Current goals can be found in the Care Plan section)  Acute Rehab PT Goals Patient Stated Goal: return home PT Goal Formulation: With patient Time For Goal Achievement: 10/20/19 Potential to Achieve Goals: Good    Frequency Min 3X/week   Barriers to discharge        Co-evaluation               AM-PAC PT "6 Clicks" Mobility  Outcome Measure Help needed turning from your back to your side while in a flat bed without  using bedrails?: None Help needed moving from lying on your back to sitting on the side of a flat bed without using bedrails?: None Help needed moving to and from a bed to a chair (including a wheelchair)?: A Little Help needed standing up from a chair using your arms (e.g., wheelchair or bedside chair)?: A Little Help needed to walk in hospital room?: A Little Help needed climbing 3-5 steps with a railing? : A Lot 6 Click Score: 19    End of Session Equipment Utilized During Treatment: Gait belt Activity Tolerance: Patient tolerated treatment well Patient left: in chair;with call bell/phone within reach;with chair alarm set Nurse Communication: Mobility status PT Visit Diagnosis: Difficulty in walking, not elsewhere classified (R26.2)    Time: 3716-9678 PT Time Calculation (min) (ACUTE ONLY): 27 min   Charges:   PT Evaluation $PT Eval Moderate Complexity: 1 Mod PT Treatments $Gait Training: 8-22 mins        Karma Ganja, PT, DPT   Acute Rehabilitation Department Pager #: [phone]  Otho Bellows 10/20/2019, 11:17 AM

## 2019-10-20 NOTE — Consult Note (Addendum)
Neurology Consultation  Reason for Consult: Confusion in the hospital setting Referring Physician: Alba Cory, MD  CC: Confusion in the hospital setting  History is obtained from: Notes and husband  HPI: Kaitlyn [name] is a 79 y.o. female with history of stroke, atrial fibrillation, hypertension, memory difficulties since 05/2019 status post stroke.  Patient was initially brought to the hospital secondary to shortness of breath.  On arrival chest x-ray did show congestion and pleural effusion with a BNP of 1300.  While in the hospital patient was showing some difficulties with her speech along with some confusion.  Neurology was asked to follow-up to evaluate if there was any underlying etiology that could be treated.  While in the room talking to the husband he states that she did have her stroke back in January 2021.  Since she had that stroke she has shown neurocognitive decline to the point where she was not able to do her job.  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  He has noticed that she has had periods of confabulation and since January he has also noticed that she has had a further decline in memory along with losing objects such as keys and or household items.  While in the hospital he has noted that she has been having hallucinations, has had confusion which has slightly improved, talks tangentially.  When discussing that she might have neurocognitive decline over time he was very open to this and thought that might be some of the issue also.  ED course  Relevant labs include - Ammonia-10/17/2019 was 93, on 10/18/2019 it was 19 B12 840 TSH 2.824   Past Medical History:  Diagnosis Date  . A-fib (HCC)   . Hypertension   . Presence of permanent cardiac pacemaker   . Stroke [facility])      Family History  Family history unknown: Yes   Social History:   reports that she has  been smoking e-cigarettes. She has never used smokeless tobacco. She reports that she does not drink alcohol or use drugs.  Medications  Current Facility-Administered Medications:  .  [START ON 10/21/2019] amLODipine (NORVASC) tablet 10 mg, 10 mg, Oral, Daily, Regalado, Belkys A, MD .  apixaban (ELIQUIS) tablet 5 mg, 5 mg, Oral, BID, Duke Salvia, MD, 5 mg at 10/20/19 1497 .  citalopram (CELEXA) tablet 10 mg, 10 mg, Oral, Daily, Eduard Clos, MD, 10 mg at 10/20/19 0831 .  clonazePAM (KLONOPIN) tablet 0.5 mg, 0.5 mg, Oral, QHS PRN, Eduard Clos, MD, 0.5 mg at 10/19/19 2157 .  famotidine (PEPCID) tablet 10 mg, 10 mg, Oral, Daily, Eduard Clos, MD, 10 mg at 10/20/19 0263 .  furosemide (LASIX) tablet 20 mg, 20 mg, Oral, Daily, Regalado, Belkys A, MD, 20 mg at 10/20/19 0831 .  lactulose (CHRONULAC) 10 GM/15ML solution 20 g, 20 g, Oral, BID, Regalado, Belkys A, MD, 20 g at 10/20/19 0838 .  magnesium oxide (MAG-OX) tablet 400 mg, 400 mg, Oral, Daily, Eduard Clos, MD, 400 mg at 10/20/19 0831 .  metoprolol succinate (TOPROL-XL) 24 hr tablet 50 mg, 50 mg, Oral, Daily, Eduard Clos, MD, 50 mg at 10/20/19 0830 .  potassium chloride SA (KLOR-CON) CR tablet 20 mEq, 20 mEq, Oral, Daily, Regalado, Belkys A, MD, 20 mEq at 10/20/19 0833 .  propafenone (RYTHMOL) tablet 150 mg, 150 mg, Oral, Daily, Kakrakandy, Selma,  MD, 150 mg at 10/20/19 0842 .  rOPINIRole (REQUIP) tablet 1 mg, 1 mg, Oral, QHS, Eduard Clos, MD, 1 mg at 10/19/19 2157  ROS:  Unable to obtain due to altered mental status.   General ROS: negative for - chills, fatigue, fever, night sweats, weight gain or weight loss Psychological ROS: Positive for -hallucinations along with confabulating thoughts  ophthalmic ROS: negative for - blurry vision, double vision, eye pain or loss of vision ENT ROS: negative for - epistaxis, nasal discharge, oral lesions, sore throat, tinnitus or vertigo Allergy and  Immunology ROS: negative for - hives or itchy/watery eyes Hematological and Lymphatic ROS: negative for - bleeding problems, bruising or swollen lymph nodes Endocrine ROS: negative for - galactorrhea, hair pattern changes, polydipsia/polyuria or temperature intolerance Respiratory ROS: negative for - cough, hemoptysis, shortness of breath or wheezing Cardiovascular ROS: negative for - chest pain, dyspnea on exertion, edema or irregular heartbeat Gastrointestinal ROS: negative for - abdominal pain, diarrhea, hematemesis, nausea/vomiting or stool incontinence Genito-Urinary ROS: negative for - dysuria, hematuria, incontinence or urinary frequency/urgency Musculoskeletal ROS: Positive for - joint swelling or muscular weakness Neurological ROS: as noted in HPI Dermatological ROS: negative for rash and skin lesion changes  Exam: Current vital signs: BP (!) 161/89 (BP Location: Right Arm)   Pulse 61   Temp 98.2 F (36.8 C) (Oral)   Resp 18   Ht 5\' 2"  (1.575 m)   Wt 63.2 kg   SpO2 98%   BMI 25.48 kg/m  Vital signs in last 24 hours: Temp:  [97.8 F (36.6 C)-98.2 F (36.8 C)] 98.2 F (36.8 C) (05/24 0629) Pulse Rate:  [60-61] 61 (05/24 0828) Resp:  [18] 18 (05/24 0629) BP: (151-161)/(89-103) 161/89 (05/24 0828) SpO2:  [97 %-98 %] 98 % (05/24 0629) Weight:  [63.2 kg] 63.2 kg (05/24 10-03-1970)   Constitutional: Appears well-developed and well-nourished.  Psych: Affect appropriate to situation Eyes: No scleral injection HENT: No OP obstrucion Head: Normocephalic.  Cardiovascular: Palpable Respiratory: Effort normal, non-labored breathing GI: Soft.  No distension. There is no tenderness.  Skin: WDI  Neuro: Mental Status: Awake, slow thought process, initially started out sentences however then would become tangential and also confabulate.  Patient did know that she was in the hospital, the month was May and the year was 2021.  She was able to follow very simple commands. Cranial  Nerves: II: Visual Fields are full.  III,IV, VI: EOMI without ptosis or diploplia. Pupils equal, round and reactive to light V: Facial sensation is symmetric to temperature VII: Facial movement is symmetric.  VIII: hearing is intact to voice X: Palat elevates symmetrically XI: Decreased strength on left shoulder shrug secondary to fracture shoulder past XII: tongue is midline without atrophy or fasciculations.  Motor: 5/5 strength on the right upper and lower extremity.  4/5 strength on the left upper and lower extremity. Sensory: Sensation is symmetric to light touch and temperature in the arms and legs. Deep Tendon Reflexes: 2+ at the Achilles, 3+ at the biceps and knee jerk.  Positive Hoffmann's but no clonus Plantars: Toes are downgoing bilaterally.  Cerebellar: FNF intact  Labs I have reviewed labs in epic and the results pertinent to this consultation are:  CBC    Component Value Date/Time   WBC 6.8 10/18/2019 0720   RBC 4.82 10/18/2019 0720   HGB 13.2 10/18/2019 0720   HCT 42.7 10/18/2019 0720   PLT 256 10/18/2019 0720   MCV 88.6 10/18/2019 0720   MCH 27.4 10/18/2019  0720   MCHC 30.9 10/18/2019 0720   RDW 15.6 (H) 10/18/2019 0720   LYMPHSABS 1.8 10/16/2019 0455   MONOABS 0.6 10/16/2019 0455   EOSABS 0.1 10/16/2019 0455   BASOSABS 0.1 10/16/2019 0455    CMP     Component Value Date/Time   NA 139 10/20/2019 0653   K 3.7 10/20/2019 0653   CL 107 10/20/2019 0653   CO2 23 10/20/2019 0653   GLUCOSE 99 10/20/2019 0653   BUN 8 10/20/2019 0653   CREATININE 0.64 10/20/2019 0653   CALCIUM 8.8 (L) 10/20/2019 0653   PROT 5.6 (L) 10/18/2019 0720   ALBUMIN 2.9 (L) 10/18/2019 0720   AST 38 10/18/2019 0720   ALT 35 10/18/2019 0720   ALKPHOS 90 10/18/2019 0720   BILITOT 1.9 (H) 10/18/2019 0720   GFRNONAA >60 10/20/2019 0653   GFRAA >60 10/20/2019 1610    Lipid Panel  No results found for: CHOL, TRIG, HDL, CHOLHDL, VLDL, LDLCALC, LDLDIRECT   Imaging I have  reviewed the images obtained:  CT-scan of the brain-mild atrophy.  Prior focal infarct involving a portion of the posterior right frontal lobe.  Mild periventricular small vessel disease.  MRI examination of the brain-chronic hemorrhagic infarct right frontal lobe.  Surrounding the infarct there may be subacute infarct which is extended, versus T2 shine through.  EEG IMPRESSION: This is an abnormal EEG secondary to general background slowing.  This finding may be seen with a diffuse disturbance that is etiologically nonspecific, but may include a metabolic encephalopathy, among other possibilities.  No epileptiform activity was noted.    Etta Quill PA-C [facility] [phone]  M-F  (9:00 am- 5:00 PM)  10/20/2019, 1:50 PM     Assessment:  This is a 79 year old female with paroxysmal A. fib and pacemaker.  Patient initially was admitted to the hospital secondary to acute on chronic diastolic heart failure exacerbation with shortness of breath and elevated BNP.  While in the hospital patient showed confusion in the setting of elevated ammonia at the level of 93.  Over the last few days per husband, she has improved slightly.  Patient does have underlying neurocognitive decline since January 2021.  I believe this is a multifactorial issue including elevated ammonia which will take a few days for her cognition to come back to her baseline in addition to progressive neurocognitive decline since January.  In addition, this very well could also have a component of recrudescence from her previous stroke in the setting of acute on chronic congestive heart failure exacerbation.  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   -Continue treat elevated ammonia, per medicine service -Continue Eliquis for secondary stroke prevention due to paroxysmal A. Fib, chronic hemorrhagic stroke not a contraindication for anticoagulation, provided patient does not have coagulopathy from liver failure.   NEUROHOSPITALIST ADDENDUM Performed a face to face diagnostic evaluation.   I have reviewed the contents of history and physical exam as documented by PA/ARNP/Resident and agree with above documentation.  I have discussed and formulated the above plan as documented. Edits to the note have been made as needed.   78 year old female with prior hemorrhagic stroke, memory difficulties post stroke admitted for acute on chronic systolic heart failure-noted to have altered mental status in the setting of hyperammonemia with ammonia of 93.  Other work-up included MRI brain which showed chronic hemorrhagic infarct with possible subacute infarct extension versus T2 shine through which is more likely the case.  EEG was negative for epileptiform discharges.  Altered mental status likely multifactorial-with poor cognitive baseline, hyperammonemia, chronic stroke worsened in the setting of acute hospitalization.  While patient is going to be slightly increased risk of hemorrhage with remote hemorrhagic infarct-benefits will likely outweigh risk with antianticoagulation for secondary prevention for stroke.  Patient and husband voiced understanding.  Outpatient neurology follow-up    Evelette Hollern MD [facility] [phone]   If 7pm to 7am, please call on call as listed on AMION.

## 2019-10-20 NOTE — Progress Notes (Signed)
Progress Note  Patient Name: Kaitlyn [name] Date of Encounter: 10/20/2019  Primary Cardiologist:   Mertie Moores, MD   Subjective   Wants to go home   Inpatient Medications    Scheduled Meds: . amLODipine  5 mg Oral Daily  . apixaban  5 mg Oral BID  . citalopram  10 mg Oral Daily  . famotidine  10 mg Oral Daily  . furosemide  20 mg Oral Daily  . lactulose  20 g Oral BID  . magnesium oxide  400 mg Oral Daily  . metoprolol succinate  50 mg Oral Daily  . potassium chloride  20 mEq Oral Daily  . propafenone  150 mg Oral Daily  . rOPINIRole  1 mg Oral QHS   Continuous Infusions:  PRN Meds: clonazePAM   Vital Signs    Vitals:   10/19/19 1959 10/20/19 0629 10/20/19 0632 10/20/19 0828  BP: (!) 151/89 (!) 160/103  (!) 161/89  Pulse: 60 60  61  Resp: 18 18    Temp: 97.8 F (36.6 C) 98.2 F (36.8 C)    TempSrc: Oral Oral    SpO2: 97% 98%    Weight:   63.2 kg   Height:        Intake/Output Summary (Last 24 hours) at 10/20/2019 1610 Last data filed at 10/19/2019 1257 Gross per 24 hour  Intake 240 ml  Output --  Net 240 ml   Filed Weights   10/18/19 0034 10/19/19 0647 10/20/19 9604  Weight: 64.5 kg 63.4 kg 63.2 kg    Telemetry    Pacing 10/20/2019   ECG    V pacing rate 65 bpm 10/15/19   Physical Exam   Affect appropriate Elderly white female  HEENT: mallor rubor  Neck supple with no adenopathy JVP normal no bruits no thyromegaly Lungs clear with no wheezing and good diaphragmatic motion Heart:  S1/S2 AS murmur, no rub, gallop or click PMI normal  Pacer below left clavicle incision looks good steri strips in place  Abdomen: benighn, BS positve, no tenderness, no AAA no bruit.  No HSM or HJR Distal pulses intact with no bruits No edema Neuro non-focal Skin warm and dry No muscular weakness   Labs    Chemistry Recent Labs  Lab 10/17/19 0504 10/17/19 1445 10/18/19 0602 10/18/19 0720 10/19/19 0802 10/20/19 0653  NA   < >  --  142  --  141  139  K   < >  --  4.0  --  3.8 3.7  CL   < >  --  103  --  104 107  CO2   < >  --  30  --  26 23  GLUCOSE   < >  --  93  --  100* 99  BUN   < >  --  15  --  6* 8  CREATININE   < >  --  0.92  --  0.74 0.64  CALCIUM   < >  --  8.8*  --  8.9 8.8*  PROT  --  5.7*  --  5.6*  --   --   ALBUMIN  --  3.2*  --  2.9*  --   --   AST  --  36  --  38  --   --   ALT  --  45*  --  35  --   --   ALKPHOS  --  95  --  90  --   --  BILITOT  --  2.2*  --  1.9*  --   --   GFRNONAA   < >  --  >60  --  >60 >60  GFRAA   < >  --  >60  --  >60 >60  ANIONGAP   < >  --  9  --  11 9   < > = values in this interval not displayed.     Hematology Recent Labs  Lab 10/16/19 0455 10/17/19 0504 10/18/19 0720  WBC 6.9 8.6 6.8  RBC 4.78 4.97 4.82  HGB 13.3 13.7 13.2  HCT 41.8 43.5 42.7  MCV 87.4 87.5 88.6  MCH 27.8 27.6 27.4  MCHC 31.8 31.5 30.9  RDW 15.8* 15.9* 15.6*  PLT 249 269 256    Cardiac EnzymesNo results for input(s): TROPONINI in the last 168 hours. No results for input(s): TROPIPOC in the last 168 hours.   BNP Recent Labs  Lab 10/15/19 1338  BNP 1,346.3*     DDimer No results for input(s): DDIMER in the last 168 hours.   Radiology    EEG adult  Result Date: 10/19/2019 Thana Farr, MD     10/19/2019  8:53 PM ELECTROENCEPHALOGRAM REPORT Patient: Kaitlyn [name]       Room #: 3E11C EEG No. ID: [id number] Age: 79 y.o.        Sex: female Requesting Physician: Regalado Report Date:  10/19/2019       Interpreting Physician: Thana Farr History: Kaitlyn [name] is an 79 y.o. female with aphasia and confusion Medications: Norvasc, Eliquis, Celexa, Lasix, Lactulose, Mag-Ox, Metoprolol, Rythmol, Requip Conditions of Recording:  This is a 21 channel routine scalp EEG performed with bipolar and monopolar montages arranged in accordance to the international 10/20 system of electrode placement. One channel was dedicated to EKG recording. The patient is in the awake state. Description:  The background is  slow and poorly organized.  It consists of low voltage, irregular activity in the delta-theta continuum.  This activity is diffusely distributed and continuous.  No epileptiform activity is noted.  Hyperventilation and intermittent photic stimulation were not performed. IMPRESSION: This is an abnormal EEG secondary to general background slowing.  This finding may be seen with a diffuse disturbance that is etiologically nonspecific, but may include a metabolic encephalopathy, among other possibilities.  No epileptiform activity was noted.  Thana Farr, MD Neurology [phone] 10/19/2019, 8:39 PM   US Abdomen Limited RUQ  Result Date: 10/18/2019 CLINICAL DATA:  Abnormal transaminases. EXAM: ULTRASOUND ABDOMEN LIMITED RIGHT UPPER QUADRANT COMPARISON:  None. FINDINGS: Gallbladder: There is a non mobile stone in the gallbladder neck measuring 2.3 cm. There is gallbladder sludge. Borderline gallbladder wall thickening of 3 mm. The sonographic Murphy's sign is negative. No pericholecystic fluid. Common bile duct: Diameter: 5.1 mm Liver: No focal lesion identified. Increased parenchymal echogenicity. Portal vein is patent on color Doppler imaging with normal direction of blood flow towards the liver. Other: Exophytic right renal cyst measures 6.2 x 5.5 x 6.1 cm. Small right pleural effusion. IMPRESSION: None mobile 2.3 cm gallstone within the gallbladder neck with nonspecific borderline gallbladder wall thickening. No definite evidence of acute cholecystitis. Diffusely increased parenchymal echogenicity of the liver, usually associated with hepatic steatosis or fibrosis. Electronically Signed   By: Ted Mcalpine M.D.   On: 10/18/2019 15:34    Cardiac Studies   Echo:    1. Left ventricular ejection fraction, by estimation, is 50%. The left  ventricle has mildly decreased function. The left  ventricle demonstrates  global hypokinesis. There is mild left ventricular hypertrophy. Left  ventricular  diastolic parameters are  indeterminate. Elevated left ventricular end-diastolic pressure.  2. Right ventricular systolic function is normal. The right ventricular  size is normal. There is mildly elevated pulmonary artery systolic  pressure. The estimated right ventricular systolic pressure is 40.2 mmHg.  3. Left atrial size was moderately dilated.  4. Right atrial size was mildly dilated.  5. The mitral valve is abnormal. Mild to moderate mitral valve  regurgitation. No evidence of mitral stenosis.  6. Tricuspid valve regurgitation is moderate.  7. The aortic valve is severely calcified with severely reduced leaflet  motion. The dimensionless index is 0.17, however LVOT TVI may be  underestimated. 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 The aortic valve is  abnormal. Aortic valve regurgitation is not visualized. Aortic valve mean  gradient measures 21.0 mmHg.  8. The inferior vena cava is dilated in size with <50% respiratory  variability, suggesting right atrial pressure of 15 mmHg.   Patient Profile     79 y.o. female with acute diastolic heart failure, pacemaker at [facility]  Assessment & Plan    ACUTE DIASTOLIC HF:   Transitioned to oral Lasix 20 mg daily appears euvolemic    PACEMAKER   Replaced this admission.  We will arrange EP follow up.  She has a wound check scheduled for June 1st.     PAF: back on eliquis with no pocket hematoma continue Rhythmol and Toprol .   MODERATE TO SEVERE AS:   Follow clinically may be a TAVR candidate in future echo in 6 months   HTN:  norvasc increased over weekend follow can titrate further meds as outpatient  EP f/u and wound check made Will make TOC f/u [facility]  Ok to d/c from our stand point  For questions or updates, please contact [facility] Please consult [email or web address] for contact info under Cardiology/STEMI.   Signed, Charlton Haws, MD  10/20/2019, 8:38 AM

## 2019-10-20 NOTE — Discharge Summary (Signed)
Physician Discharge Summary  Kaitlyn [name] RUE:[id number] DOB: 12-24-1940 DOA: 10/15/2019  PCP: Kaitlyn, No Pcp Per  Admit date: 10/15/2019 Discharge date: 10/20/2019  Admitted From: Home  Disposition:  Home   Recommendations for Outpatient Follow-up:  1. Follow up with PCP in 1-2 weeks 2. Please obtain BMP/CBC in one week 3. Follow up with cardiology for further care of aortic stenosis and HF 4. Follow up with EP post pacemanker exchange.  5. Follow up with neurology for evaluation of Dementia.  6. Follow up with GI for evaluation of liver diseases  Home Health: yes.   Discharge Condition: Stable CODE STATUS: Full code Diet recommendation: Heart Healthy   Brief/Interim Summary: 79 year old with past medical history significant for A. fib, status post pacemaker, recent stroke January 2021 10 in dysarthria, grade 2 diastolic dysfunction, severe aortic stenosis, who presents complaining of worsening shortness of breath over the last 3 days prior to admission and lower extremity edema.  Evaluation in the ED chest x-ray showed congestion and pleural effusion, BNP 1300, troponin 31/29.  Covid test negative, EKG shows paced rhythm.  Kaitlyn has been admitted for heart failure exacerbation.   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 -She has received 3 doses of IV lasix. Discussed with cardiology, plan to transition to oral lasix.  -Weight; 152---148--144 -Output 3.9 L. -Stable.   2-Paroxysmal A. fib:  Status post pacemaker, cardiology will interrogate pacer and evaluate battery. Pacemaker was exchange. 5/21. Continue with apixaban and metoprolol. On propafenone.  3-Recent stroke Speech difficulties, confusion Continue with apixaban Still with aphasia and confusion some improvement, ammonia decreased.  EEG Negative for seizure.   MRI ; showed chronic hemorrhagic infarct, sub acute infarct. Neurology consulted. Per neurology Kaitlyn can continue eliquis. Needs evaluation outpatient for Dementia.    4-History of anxiety or depression: Continue with citalopram.  5-History of GI  bleed; monitor on Eliquis.   6-Aortic stenosis, moderate to severe ECHO from [facility]. ECHO repeated here. Cardiology consulted. Plan to out Kaitlyn follow up.   7-Acute hepatic encephalopathy;  More confuse. Per husband Kaitlyn has been confuse since the stroke.   ammonia ; elevated at 90 --19 Started  lactulose.   B 12 level normal.  Repeated CT head; Mild atrophy. Prior focal infarct involving a portion of the posterior right frontal lobe. Mild periventricular small vessel disease. No acute infarct. No mass or hemorrhage. MRI; chronic hemorrhagic infarct and sub acute. Neurology consulted.  Ok to continue with eliquis. Out Kaitlyn evaluation for Dementia  7-Elevation ammonia; [place]; gallstone gallbladder neck, no evidence of obstruction, sign of hepatic steatosis or fibrosis. Might have some underline cirrhosis. Hepatitis panel negative Will refer her to GI, [place] out Kaitlyn.  LFT trend down.  Close follow up  INR at 1.5 discussed with neurology ok to continue eliquis for a. fib. Close follow up  Discharge Diagnoses:  Principal Problem:   Acute exacerbation of CHF (congestive heart failure) (HCC) Active Problems:   PAF (paroxysmal atrial fibrillation) (HCC)   Essential hypertension   History of ischemic stroke   Acute CHF (congestive heart failure) [facility])    Discharge Instructions  Discharge Instructions    Diet - low sodium heart healthy   Complete by: As directed    Increase activity slowly   Complete by: As directed      Allergies as of 10/20/2019      Reactions  Lisinopril Cough         Medication List    STOP taking these medications   famotidine-calcium carbonate-magnesium hydroxide [phone] MG chewable  tablet Commonly known as: PEPCID COMPLETE   Potassium 99 MG Tabs     TAKE these medications   amLODipine 2.5 MG tablet Commonly known as: NORVASC Take 1 tablet (2.5 mg total) by mouth daily. Start taking on: Oct 21, 2019   apixaban 5 MG Tabs tablet Commonly known as: ELIQUIS Take 5 mg by mouth 2 (two) times daily.   citalopram 10 MG tablet Commonly known as: CELEXA Take 10 mg by mouth daily.   clonazePAM 0.5 MG tablet Commonly known as: KLONOPIN Take 0.5 mg by mouth at bedtime as needed for anxiety.   famotidine 10 MG tablet Commonly known as: PEPCID Take 1 tablet (10 mg total) by mouth daily. Start taking on: Oct 21, 2019   furosemide 20 MG tablet Commonly known as: LASIX Take 1 tablet (20 mg total) by mouth daily. Start taking on: Oct 21, 2019   lactulose 10 GM/15ML solution Commonly known as: CHRONULAC Take 30 mLs (20 g total) by mouth 2 (two) times daily.   Magnesium 400 MG Tabs Take 400 mg by mouth daily.   metoprolol succinate 50 MG 24 hr tablet Commonly known as: TOPROL-XL Take 50 mg by mouth daily.   potassium chloride SA 20 MEQ tablet Commonly known as: KLOR-CON Take 1 tablet (20 mEq total) by mouth daily. Start taking on: Oct 21, 2019   propafenone 150 MG tablet Commonly known as: RYTHMOL Take 150 mg by mouth daily.   rOPINIRole 0.5 MG tablet Commonly known as: REQUIP Take 1 mg by mouth at bedtime.      Follow-up Information    Georgina Quint, MD Follow up on 11/13/2019.   Specialty: Internal Medicine Why: 10:20 for new Kaitlyn , bring insurance card and photo id and be 15 minutes early Contact information: [address] [place] [address] [phone]        [facility] Follow up.   Specialty: Home Health Services Why: HHRN,HHPT, HHST Contact information: [address] [place] [address] [phone]        [facility] [address] Office Follow up.   Specialty: Cardiology Why: 10/28/2019 @  12:00PM (noon), wound check visit Contact information: [address] [place] [address] [phone]       Duke Salvia, MD Follow up.   Specialty: Cardiology Why: 01/21/2020 @ 2:15PM Contact information: [address] [place] [address] [phone]        Micki Riley, MD Follow up in 1 week(s).   Specialties: Neurology, Radiology Why: call to arrange follow up  Contact information: [address] [place] [address] [phone]          Allergies  Allergen Reactions  . Lisinopril Cough         Consultations:  Cardiology  Neurology    Procedures/Studies: CT HEAD WO CONTRAST  Result Date: 10/17/2019 CLINICAL DATA:  Altered mental status with delirium EXAM: CT HEAD WITHOUT CONTRAST TECHNIQUE: Contiguous axial images were obtained from the base of the skull through the vertex without intravenous contrast. COMPARISON:  None. FINDINGS: Brain: There is mild diffuse atrophy. There is no intracranial mass, hemorrhage, extra-axial fluid collection, or midline shift. There is evidence of a prior infarct in the posterior right frontal lobe extending to the level of the right  sylvian fissure. Elsewhere there is slight small vessel disease in the centra semiovale bilaterally. No acute infarct is evident. Vascular: No hyperdense vessel. There is calcification in each distal vertebral artery and carotid siphon region. Skull: The bony calvarium appears intact. Sinuses/Orbits: There is mucosal thickening in several ethmoid air cells. Other visualized paranasal sinuses are clear. Orbits appear symmetric bilaterally. Other: Mastoid air cells are clear. IMPRESSION: Mild atrophy. Prior focal infarct involving a portion of the posterior right frontal lobe. Mild periventricular small vessel disease. No acute infarct. No mass or hemorrhage. There are multiple foci of arterial vascular calcification. There is mucosal  thickening in several ethmoid air cells. Electronically Signed   By: Bretta Bang III M.D.   On: 10/17/2019 13:30   MR BRAIN WO CONTRAST  Result Date: 10/20/2019 CLINICAL DATA:  Delirium EXAM: MRI HEAD WITHOUT CONTRAST TECHNIQUE: Multiplanar, multiecho pulse sequences of the brain and surrounding structures were obtained without intravenous contrast. COMPARISON:  CT head 08/17/2019 FINDINGS: Brain: Chronic hemorrhagic infarct right frontal lobe. Surrounding the infarct particularly posterior and medial to the infarct, there is an area of hyperintense diffusion signal. There is intermediate signal in this area on ADC. This may represent a subacute infarct which has extended from the chronic infarct. Correlate with symptoms. Mild atrophy. Chronic microvascular ischemic change in the white matter. Negative for mass or midline shift. Vascular: Normal arterial flow voids. Skull and upper cervical spine: No focal skeletal lesion. Sinuses/Orbits: Paranasal sinuses clear.  No orbital lesion. Other: None IMPRESSION: 1. Chronic hemorrhagic infarct right frontal lobe. Surrounding the infarct there may be subacute infarct which is extended, versus T2 shine through. Correlate with symptoms. 2. Atrophy and chronic microvascular ischemic change in the white matter. Electronically Signed   By: Marlan Palau M.D.   On: 10/20/2019 12:44   DG Chest Portable 1 View  Result Date: 10/15/2019 CLINICAL DATA:  Cough and shortness of breath. Flu like symptoms for 4 days. EXAM: PORTABLE CHEST 1 VIEW COMPARISON:  Radiograph 11/22/2017 FINDINGS: Left-sided pacemaker remains in place. Cardiomegaly is increased from prior exam. Unchanged mediastinal contours. There is a retrocardiac hiatal hernia. Interstitial and peribronchial thickening suspicious for pulmonary edema. Small pleural effusions. No confluent airspace disease. Reverse left shoulder arthroplasty. No acute osseous abnormalities are seen. IMPRESSION: 1. Cardiomegaly and  small pleural effusions. Interstitial thickening suspicious for pulmonary edema. Findings suspicious for CHF. 2. Hiatal hernia. Electronically Signed   By: Narda Rutherford M.D.   On: 10/15/2019 13:36   EEG adult  Result Date: 10/19/2019 Thana Farr, MD     10/19/2019  8:53 PM ELECTROENCEPHALOGRAM REPORT Kaitlyn: Nykayla G [name]       Room #: 3E11C EEG No. ID: [id number] Age: 79 y.o.        Sex: female Requesting Physician: Olympia Adelsberger Report Date:  10/19/2019       Interpreting Physician: Thana Farr History: Constanza G [name] is an 79 y.o. female with aphasia and confusion Medications: Norvasc, Eliquis, Celexa, Lasix, Lactulose, Mag-Ox, Metoprolol, Rythmol, Requip Conditions of Recording:  This is a 21 channel routine scalp EEG performed with bipolar and monopolar montages arranged in accordance to the international 10/20 system of electrode placement. One channel was dedicated to EKG recording. The Kaitlyn is in the awake state. Description:  The background is slow and poorly organized.  It consists of low voltage, irregular activity in the delta-theta continuum.  This activity is diffusely distributed and continuous.  No epileptiform activity is noted.  Hyperventilation and intermittent photic stimulation were not  performed. IMPRESSION: This is an abnormal EEG secondary to general background slowing.  This finding may be seen with a diffuse disturbance that is etiologically nonspecific, but may include a metabolic encephalopathy, among other possibilities.  No epileptiform activity was noted.  Thana Farr, MD Neurology [phone] 10/19/2019, 8:39 PM   ECHOCARDIOGRAM COMPLETE  Result Date: 10/16/2019    ECHOCARDIOGRAM REPORT   Kaitlyn Name:   Saveah G [name] Date of Exam: 10/16/2019 Medical Rec #:  [id number]     Height:       62.0 in Accession #:    [id number]    Weight:       148.1 lb Date of Birth:  [birth year]     BSA:          1.683 m Kaitlyn Age:    79 years      BP:           152/88 mmHg Kaitlyn  Gender: F             HR:           65 bpm. Exam Location:  Inpatient Procedure: 2D Echo, Cardiac Doppler and Color Doppler Indications:    I50.23 Acute on chronic systolic (congestive) heart failure  History:        Kaitlyn has no prior history of Echocardiogram examinations.                 Pacemaker, Stroke, Arrythmias:Atrial Fibrillation; Risk                 Factors:Current Smoker and Hypertension.  Sonographer:    Elmarie Shiley Dance Referring Phys: [id number] ARSHAD N KAKRAKANDY IMPRESSIONS  1. Left ventricular ejection fraction, by estimation, is 50%. The left ventricle has mildly decreased function. The left ventricle demonstrates global hypokinesis. There is mild left ventricular hypertrophy. Left ventricular diastolic parameters are indeterminate. Elevated left ventricular end-diastolic pressure.  2. Right ventricular systolic function is normal. The right ventricular size is normal. There is mildly elevated pulmonary artery systolic pressure. The estimated right ventricular systolic pressure is 40.2 mmHg.  3. Left atrial size was moderately dilated.  4. Right atrial size was mildly dilated.  5. The mitral valve is abnormal. Mild to moderate mitral valve regurgitation. No evidence of mitral stenosis.  6. Tricuspid valve regurgitation is moderate.  7. The aortic valve is severely calcified with severely reduced leaflet motion. The dimensionless index is 0.17, however LVOT TVI may be underestimated. Overall findings suggest at least moderate-severe aortic valve stenosis. The aortic valve is abnormal. Aortic valve regurgitation is not visualized. Aortic valve mean gradient measures 21.0 mmHg.  8. The inferior vena cava is dilated in size with <50% respiratory variability, suggesting right atrial pressure of 15 mmHg. FINDINGS  Left Ventricle: Left ventricular ejection fraction, by estimation, is 50%. The left ventricle has mildly decreased function. The left ventricle demonstrates global hypokinesis. The left ventricular  internal cavity size was normal in size. There is mild left ventricular hypertrophy. Left ventricular diastolic parameters are indeterminate. Elevated left ventricular end-diastolic pressure. Right Ventricle: The right ventricular size is normal. No increase in right ventricular wall thickness. Right ventricular systolic function is normal. There is mildly elevated pulmonary artery systolic pressure. The tricuspid regurgitant velocity is 2.51  m/s, and with an assumed right atrial pressure of 15 mmHg, the estimated right ventricular systolic pressure is 40.2 mmHg. Left Atrium: Left atrial size was moderately dilated. Right Atrium: Right atrial size was mildly dilated. Pericardium: There is no evidence of pericardial effusion.  Mitral Valve: The mitral valve is abnormal. Normal mobility of the mitral valve leaflets. Moderate mitral annular calcification. Mild to moderate mitral valve regurgitation. No evidence of mitral valve stenosis. Tricuspid Valve: The tricuspid valve is normal in structure. Tricuspid valve regurgitation is moderate . No evidence of tricuspid stenosis. Aortic Valve: The aortic valve is severely calcified with severely reduced leaflet motion. The dimensionless index is 0.17, however LVOT TVI may be underestimated. Overall findings suggest at least moderate-severe aortic valve stenosis. The aortic valve is abnormal. Aortic valve regurgitation is not visualized. Aortic valve mean gradient measures 21.0 mmHg. Aortic valve peak gradient measures 31.6 mmHg. Aortic valve area, by VTI measures 0.48 cm. Pulmonic Valve: The pulmonic valve was normal in structure. Pulmonic valve regurgitation is trivial. No evidence of pulmonic stenosis. Aorta: The aortic root is normal in size and structure and the ascending aorta was not well visualized. Venous: The inferior vena cava is dilated in size with less than 50% respiratory variability, suggesting right atrial pressure of 15 mmHg. IAS/Shunts: No atrial level  shunt detected by color flow Doppler. Additional Comments: A pacer wire is visualized in the right atrium and right ventricle.  LEFT VENTRICLE PLAX 2D LVIDd:         3.80 cm  Diastology LVIDs:         3.00 cm  LV e' lateral:   4.33 cm/s LV PW:         1.40 cm  LV E/e' lateral: 20.7 LV IVS:        1.00 cm  LV e' medial:    2.87 cm/s LVOT diam:     1.90 cm  LV E/e' medial:  31.3 LV SV:         31 LV SV Index:   19 LVOT Area:     2.84 cm  RIGHT VENTRICLE            IVC RV Basal diam:  3.00 cm    IVC diam: 2.60 cm RV Mid diam:    2.00 cm RV S prime:     7.09 cm/s TAPSE (M-mode): 1.4 cm LEFT ATRIUM             Index LA diam:        4.60 cm 2.73 cm/m LA Vol (A2C):   74.3 ml 44.16 ml/m LA Vol (A4C):   53.1 ml 31.56 ml/m LA Biplane Vol: 64.3 ml 38.22 ml/m  AORTIC VALVE AV Area (Vmax):    0.51 cm AV Area (Vmean):   0.45 cm AV Area (VTI):     0.48 cm AV Vmax:           281.00 cm/s AV Vmean:          203.500 cm/s AV VTI:            0.644 m AV Peak Grad:      31.6 mmHg AV Mean Grad:      21.0 mmHg LVOT Vmax:         51.00 cm/s LVOT Vmean:        32.400 cm/s LVOT VTI:          0.110 m LVOT/AV VTI ratio: 0.17  AORTA Ao Root diam: 3.20 cm Ao Asc diam:  3.70 cm MITRAL VALVE               TRICUSPID VALVE MV Area (PHT): 1.83 cm    TR Peak grad:   25.2 mmHg MV Decel Time: 414 msec    TR Vmax:  251.00 cm/s MV E velocity: 89.75 cm/s                            SHUNTS                            Systemic VTI:  0.11 m                            Systemic Diam: 1.90 cm Weston Brass MD Electronically signed by Weston Brass MD Signature Date/Time: 10/16/2019/10:40:43 PM    Final    US Abdomen Limited RUQ  Result Date: 10/18/2019 CLINICAL DATA:  Abnormal transaminases. EXAM: ULTRASOUND ABDOMEN LIMITED RIGHT UPPER QUADRANT COMPARISON:  None. FINDINGS: Gallbladder: There is a non mobile stone in the gallbladder neck measuring 2.3 cm. There is gallbladder sludge. Borderline gallbladder wall thickening of 3 mm. The  sonographic Murphy's sign is negative. No pericholecystic fluid. Common bile duct: Diameter: 5.1 mm Liver: No focal lesion identified. Increased parenchymal echogenicity. Portal vein is patent on color Doppler imaging with normal direction of blood flow towards the liver. Other: Exophytic right renal cyst measures 6.2 x 5.5 x 6.1 cm. Small right pleural effusion. IMPRESSION: None mobile 2.3 cm gallstone within the gallbladder neck with nonspecific borderline gallbladder wall thickening. No definite evidence of acute cholecystitis. Diffusely increased parenchymal echogenicity of the liver, usually associated with hepatic steatosis or fibrosis. Electronically Signed   By: Ted Mcalpine M.D.   On: 10/18/2019 15:34     Subjective: She is alert, oriented times 2, she is not able to tell me where she is at.  She denies dyspnea.  Appears less confuse  Discharge Exam: Vitals:   10/20/19 1415 10/20/19 1454  BP: 98/65 94/74  Pulse:  61  Resp:    Temp:    SpO2:       General: Pt is alert, awake, not in acute distress Cardiovascular: RRR, S1/S2 +, no rubs, no gallops Respiratory: CTA bilaterally, no wheezing, no rhonchi Abdominal: Soft, NT, ND, bowel sounds + Extremities: no edema, no cyanosis    The results of significant diagnostics from this hospitalization (including imaging, microbiology, ancillary and laboratory) are listed below for reference.     Microbiology: Recent Results (from the past 240 hour(s))  SARS Coronavirus 2 by RT PCR (hospital order, performed in [facility] hospital lab) Nasopharyngeal Nasopharyngeal Swab     Status: None   Collection Time: 10/15/19  3:07 PM   Specimen: Nasopharyngeal Swab  Result Value Ref Range Status   SARS Coronavirus 2 NEGATIVE NEGATIVE Final    Comment: (NOTE) SARS-CoV-2 target nucleic acids are NOT DETECTED. The SARS-CoV-2 RNA is generally detectable in upper and lower respiratory specimens during the acute phase of infection. The  lowest concentration of SARS-CoV-2 viral copies this assay can detect is 250 copies / mL. A negative result does not preclude SARS-CoV-2 infection and should not be used as the sole basis for treatment or other Kaitlyn management decisions.  A negative result may occur with improper specimen collection / handling, submission of specimen other than nasopharyngeal swab, presence of viral mutation(s) within the areas targeted by this assay, and inadequate number of viral copies (<250 copies / mL). A negative result must be combined with clinical observations, Kaitlyn history, and epidemiological information. Fact Sheet for Patients:   BoilerBrush.com.cy Fact Sheet for Healthcare Providers: https://pope.com/ This test is  not yet approved or cleared  by the [place] FDA and has been authorized for detection and/or diagnosis of SARS-CoV-2 by FDA under an Emergency Use Authorization (EUA).  This EUA will remain in effect (meaning this test can be used) for the duration of the COVID-19 declaration under Section 564(b)(1) of the Act, 21 U.S.C. section 360bbb-3(b)(1), unless the authorization is terminated or revoked sooner. Performed at [facility] [place], [address] [place], [place] [address]   Surgical pcr screen     Status: None   Collection Time: 10/17/19  8:59 AM   Specimen: Nasal Mucosa; Nasal Swab  Result Value Ref Range Status   MRSA, PCR NEGATIVE NEGATIVE Final   Staphylococcus aureus NEGATIVE NEGATIVE Final    Comment: (NOTE) The Xpert SA Assay (FDA approved for NASAL specimens in patients 77 years of age and older), is one component of a comprehensive surveillance program. It is not intended to diagnose infection nor to guide or monitor treatment. Performed at [facility], [address] [place], [place] [address]      Labs: BNP (last 3 results) Recent Labs    10/15/19 1338  BNP 1,346.3*   Basic  Metabolic Panel: Recent Labs  Lab 10/16/19 0455 10/17/19 0504 10/18/19 0602 10/19/19 0802 10/20/19 0653  NA 141 141 142 141 139  K 3.5 3.7 4.0 3.8 3.7  CL 104 105 103 104 107  CO2 28 26 30 26 23   GLUCOSE 111* 110* 93 100* 99  BUN 19 20 15  6* 8  CREATININE 0.89 0.95 0.92 0.74 0.64  CALCIUM 9.4 9.1 8.8* 8.9 8.8*  MG 1.7  --   --   --   --    Liver Function Tests: Recent Labs  Lab 10/17/19 1445 10/18/19 0720  AST 36 38  ALT 45* 35  ALKPHOS 95 90  BILITOT 2.2* 1.9*  PROT 5.7* 5.6*  ALBUMIN 3.2* 2.9*   No results for input(s): LIPASE, AMYLASE in the last 168 hours. Recent Labs  Lab 10/17/19 1059 10/18/19 0602  AMMONIA 93* 19   CBC: Recent Labs  Lab 10/15/19 1337 10/16/19 0455 10/17/19 0504 10/18/19 0720  WBC 7.6 6.9 8.6 6.8  NEUTROABS 5.6 4.2  --   --   HGB 13.9 13.3 13.7 13.2  HCT 44.1 41.8 43.5 42.7  MCV 88.2 87.4 87.5 88.6  PLT 265 249 269 256   Cardiac Enzymes: No results for input(s): CKTOTAL, CKMB, CKMBINDEX, TROPONINI in the last 168 hours. BNP: Invalid input(s): POCBNP CBG: No results for input(s): GLUCAP in the last 168 hours. D-Dimer No results for input(s): DDIMER in the last 72 hours. Hgb A1c No results for input(s): HGBA1C in the last 72 hours. Lipid Profile No results for input(s): CHOL, HDL, LDLCALC, TRIG, CHOLHDL, LDLDIRECT in the last 72 hours. Thyroid function studies No results for input(s): TSH, T4TOTAL, T3FREE, THYROIDAB in the last 72 hours.  Invalid input(s): FREET3 Anemia work up No results for input(s): VITAMINB12, FOLATE, FERRITIN, TIBC, IRON, RETICCTPCT in the last 72 hours. Urinalysis No results found for: COLORURINE, APPEARANCEUR, LABSPEC, PHURINE, GLUCOSEU, HGBUR, BILIRUBINUR, KETONESUR, PROTEINUR, UROBILINOGEN, NITRITE, LEUKOCYTESUR Sepsis Labs Invalid input(s): PROCALCITONIN,  WBC,  LACTICIDVEN Microbiology Recent Results (from the past 240 hour(s))  SARS Coronavirus 2 by RT PCR (hospital order, performed in [facility] hospital lab) Nasopharyngeal Nasopharyngeal Swab     Status: None   Collection Time: 10/15/19  3:07 PM   Specimen: Nasopharyngeal Swab  Result Value Ref Range Status  SARS Coronavirus 2 NEGATIVE NEGATIVE Final    Comment: (NOTE) SARS-CoV-2 target nucleic acids are NOT DETECTED. The SARS-CoV-2 RNA is generally detectable in upper and lower respiratory specimens during the acute phase of infection. The lowest concentration of SARS-CoV-2 viral copies this assay can detect is 250 copies / mL. A negative result does not preclude SARS-CoV-2 infection and should not be used as the sole basis for treatment or other Kaitlyn management decisions.  A negative result may occur with improper specimen collection / handling, submission of specimen other than nasopharyngeal swab, presence of viral mutation(s) within the areas targeted by this assay, and inadequate number of viral copies (<250 copies / mL). A negative result must be combined with clinical observations, Kaitlyn history, and epidemiological information. Fact Sheet for Patients:   StrictlyIdeas.no Fact Sheet for Healthcare Providers: BankingDealers.co.za This test is not yet approved or cleared  by the [place] FDA and has been authorized for detection and/or diagnosis of SARS-CoV-2 by FDA under an Emergency Use Authorization (EUA).  This EUA will remain in effect (meaning this test can be used) for the duration of the COVID-19 declaration under Section 564(b)(1) of the Act, 21 U.S.C. section 360bbb-3(b)(1), unless the authorization is terminated or revoked sooner. Performed at [facility], [address], [place], [place] [address]   Surgical pcr screen     Status: None   Collection Time: 10/17/19  8:59 AM   Specimen: Nasal Mucosa; Nasal Swab  Result Value Ref Range Status   MRSA, PCR NEGATIVE NEGATIVE Final   Staphylococcus aureus NEGATIVE NEGATIVE Final     Comment: (NOTE) The Xpert SA Assay (FDA approved for NASAL specimens in patients 27 years of age and older), is one component of a comprehensive surveillance program. It is not intended to diagnose infection nor to guide or monitor treatment. Performed at [facility], [place] [address] [place], [place] [address]      Time coordinating discharge: 40 minutes  SIGNED:   Elmarie Shiley, MD  [facility]

## 2019-10-20 NOTE — Progress Notes (Signed)
Carelink express sent for Metronic pacer.  Orders received pacer chenged to AOO 75 per orders.  Once MRI complete will return pacer to prescan settings.

## 2019-10-20 NOTE — Telephone Encounter (Signed)
Pt has TOC Appt with Vin Bhagat 11/06/19 at 2:15 pm

## 2019-10-21 NOTE — Telephone Encounter (Signed)
**Note De-Identified Kaitlyn [name] Obfuscation** 1st TCM Call attempt: No answer and no way to leave a message as her VM is full. Will continue to call.

## 2019-10-22 NOTE — Telephone Encounter (Signed)
**Note De-Identified Kaitlyn [name] Obfuscation** Patient contacted regarding discharge from [facility] on 10/20/2019. Pt requested that I s/w her family friend, Kaitlyn [name].  Patient and Kaitlyn [name] understands that the pt is to follow up with provider Chelsea Aus, PA-c on 11/06/2019 at 2:15 at [address] Suite in [place], [place] [address].  Kaitlyn [name] states that they live close to our [facility] office and is requesting that both the pts wound check and hosp f/u's be scheduled at that office as it will be easier for the pt. I have advised her that someone from this office will be contacting them to discuss moving this appt to the [facility] office.  Patient and Kaitlyn [name] understands the pts discharge instructions? yes Patient and Kaitlyn [name] understands the pts medications and regiment? yes Patient and Kaitlyn [name] understands to bring all the pts medications to this visit? yes  Ask patient:  Are you enrolled in My Chart: they are going over hospital papers now and have not yet decided if interested or not.

## 2019-10-28 ENCOUNTER — Ambulatory Visit (INDEPENDENT_AMBULATORY_CARE_PROVIDER_SITE_OTHER): Payer: Medicare Other | Admitting: Emergency Medicine

## 2019-10-28 ENCOUNTER — Other Ambulatory Visit: Payer: Self-pay

## 2019-10-28 DIAGNOSIS — I495 Sick sinus syndrome: Secondary | ICD-10-CM

## 2019-10-29 ENCOUNTER — Telehealth: Payer: Self-pay | Admitting: Emergency Medicine

## 2019-10-29 LAB — CUP PACEART INCLINIC DEVICE CHECK
Battery Remaining Longevity: 162 mo
Battery Voltage: 3.22 V
Brady Statistic AP VP Percent: 0.18 %
Brady Statistic AP VS Percent: 99.06 %
Brady Statistic AS VP Percent: 0 %
Brady Statistic AS VS Percent: 0.76 %
Brady Statistic RA Percent Paced: 99.48 %
Brady Statistic RV Percent Paced: 0.18 %
Date Time Interrogation Session: 20210601123600
Implantable Lead Implant Date: 20091201
Implantable Lead Implant Date: 20091209
Implantable Lead Location: 753859
Implantable Lead Location: 753860
Implantable Lead Model: 4076
Implantable Lead Model: 4076
Implantable Pulse Generator Implant Date: 20210521
Lead Channel Impedance Value: 323 Ohm
Lead Channel Impedance Value: 399 Ohm
Lead Channel Impedance Value: 665 Ohm
Lead Channel Impedance Value: 741 Ohm
Lead Channel Pacing Threshold Amplitude: 0.5 V
Lead Channel Pacing Threshold Amplitude: 0.75 V
Lead Channel Pacing Threshold Pulse Width: 0.4 ms
Lead Channel Pacing Threshold Pulse Width: 0.4 ms
Lead Channel Sensing Intrinsic Amplitude: 13.875 mV
Lead Channel Sensing Intrinsic Amplitude: 2.5 mV
Lead Channel Setting Pacing Amplitude: 1.5 V
Lead Channel Setting Pacing Amplitude: 2.5 V
Lead Channel Setting Pacing Pulse Width: 0.4 ms
Lead Channel Setting Sensing Sensitivity: 5.6 mV

## 2019-10-29 NOTE — Progress Notes (Signed)
Wound check appointment. Dermabond removed. Wound without redness or edema. Incision edges approximated, wound well healed. Normal device function. Thresholds, sensing, and impedances consistent with implant measurements. Device programmed at chronic settings due to mature leads. Histogram distribution appropriate for patient and level of activity. No mode switches or high ventricular rates noted. Patient educated about wound care, arm mobility, lifting restrictions. ROV with Dr Graciela Husbands 01/21/20. Next remote transmission 01/16/20, will transfer patient in from Dr Lois Huxley practice.

## 2019-10-29 NOTE — Telephone Encounter (Signed)
LMOM. Need seriel # on remote monitor.

## 2019-10-30 NOTE — Telephone Encounter (Signed)
Follow Up  Called patient, no answer, left vm confirming that Dr. Eden Emms and his APP only provides care in the [facility] office in [place]. Instructed to call back if there are any questions or concerns.

## 2019-10-31 NOTE — Telephone Encounter (Signed)
LMOVM

## 2019-11-05 ENCOUNTER — Other Ambulatory Visit: Payer: Self-pay

## 2019-11-05 ENCOUNTER — Ambulatory Visit (INDEPENDENT_AMBULATORY_CARE_PROVIDER_SITE_OTHER): Payer: Medicare Other | Admitting: Physician Assistant

## 2019-11-05 VITALS — BP 92/58 | HR 61 | Ht 61.75 in | Wt 142.0 lb

## 2019-11-05 DIAGNOSIS — Z950 Presence of cardiac pacemaker: Secondary | ICD-10-CM

## 2019-11-05 DIAGNOSIS — Z79899 Other long term (current) drug therapy: Secondary | ICD-10-CM | POA: Diagnosis not present

## 2019-11-05 DIAGNOSIS — I5031 Acute diastolic (congestive) heart failure: Secondary | ICD-10-CM | POA: Diagnosis not present

## 2019-11-05 DIAGNOSIS — I480 Paroxysmal atrial fibrillation: Secondary | ICD-10-CM | POA: Diagnosis not present

## 2019-11-05 DIAGNOSIS — I5032 Chronic diastolic (congestive) heart failure: Secondary | ICD-10-CM

## 2019-11-05 DIAGNOSIS — I350 Nonrheumatic aortic (valve) stenosis: Secondary | ICD-10-CM

## 2019-11-05 MED ORDER — FUROSEMIDE 20 MG PO TABS: 20.0000 mg | ORAL_TABLET | Freq: Every day | ORAL | 1 refills | Status: DC

## 2019-11-05 MED ORDER — POTASSIUM CHLORIDE CRYS ER 20 MEQ PO TBCR: 20.0000 meq | EXTENDED_RELEASE_TABLET | Freq: Every day | ORAL | 1 refills | Status: DC

## 2019-11-05 MED ORDER — MAGNESIUM 400 MG PO TABS: 400.0000 mg | ORAL_TABLET | Freq: Every day | ORAL | 1 refills | Status: DC

## 2019-11-05 MED ORDER — PROPAFENONE HCL 150 MG PO TABS: 150.0000 mg | ORAL_TABLET | Freq: Three times a day (TID) | ORAL | 1 refills | Status: DC

## 2019-11-05 NOTE — Progress Notes (Signed)
Cardiology Office Note Date:  11/05/2019  Patient ID:  Kaitlyn [name], DOB 1940/10/15, MRN [id number] PCP:  Patient, No Pcp Per  Cardiologist:  previously Dr. Wynonia Hazard, [facility] in [place]) >> Dr. Elease Hashimoto EP: new to Dr. Graciela Husbands   Chief Complaint:  post hospital    History of Present Illness: Kaitlyn [name] is a 79 y.o. female with history of  CVA (while off a/c 2/2 GIB), AFib, remote DVT, HTN, SSSx w/PPM, GIB, VHD w/ AS, prior cardiology mentions VSD.   was admitted to [facility] 5/19 with increasing DOE, SOB, edema, noted with CHF, mild flat abn HS trop not c/w ACS. Cardiology is managing, noted that her PPM has reached ERI and EP called, with reports that she preferred to transition her care to [place]. Underwent gen change 5/21, her rythmol and toprol continued  Cardiology service followed, she had volume OL and noted to have mod-severe AS.  She had some waxing/waning mental status, CT was negative for stroke.  Neurology consulted, noting since her stroke Jan 2021 she had declining neurocognitive status to the point of having to quit work, losing things, and poor memory and periods of confabulation.    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   -Continue treat elevated ammonia, per medicine service -Continue Eliquis for secondary stroke prevention due to paroxysmal A. Fib, chronic hemorrhagic stroke not a contraindication for anticoagulation, provided patient does not have coagulopathy from liver failure.  Her Eliquis resumed POD #1 given h/o stroke Cardiology felt AS more on the moderate side to be managed clinically, she was transitioned to oral lasix, HTN regime adjusted and signed off  10/20/19 with plans to do an echo in 72mo, perhaps a TAVR candidate in the future. Discharged 10/20/2019   TODAY She comes in today to be seen for cardiology service post hospital follow up.  Her husband accompanies her.   The patient reports she has felt well since her discharge.  No CP, palpitations or cardiac awareness. She is not overly active,. Denies any SOB or DOE with usual daily activities. No near syncope or syncope.  No bleeding or signs of bleeding  She mentions since he stroke in January her voice has not quite recovered to it's usual strnght, this bothers her.  Her husband mentions that the medicine of hers that is 3x day is difficult, he worries that the timing may be too close together. I do not see any of her current meds that are Rx TID by her current list and he does not know the name of it.  Device information MDT dual chamber PPM implanted 04/28/2008 >> Gen change 10/17/19  Past Medical History:  Diagnosis Date  . A-fib (HCC)   . Hypertension   . Presence of permanent cardiac pacemaker   . Stroke [facility])     Past Surgical History:  Procedure Laterality Date  . CERVICAL SPINE SURGERY    . PACEMAKER IMPLANT    . PPM GENERATOR CHANGEOUT N/A 10/17/2019   Procedure: PPM GENERATOR CHANGEOUT;  Surgeon: Duke Salvia, MD;  Location: [facility] INVASIVE CV LAB;  Service: Cardiovascular;  Laterality: N/A;  . shoulder replacement Left     Current Outpatient Medications  Medication Sig Dispense Refill  . apixaban (ELIQUIS) 5 MG TABS tablet Take 5 mg by mouth 2 (two) times daily.     Marland Kitchen  citalopram (CELEXA) 10 MG tablet Take 10 mg by mouth daily.    . clonazePAM (KLONOPIN) 0.5 MG tablet Take 0.5 mg by mouth at bedtime as needed for anxiety.     . famotidine (PEPCID) 10 MG tablet Take 1 tablet (10 mg total) by mouth daily. 30 tablet 0  . furosemide (LASIX) 20 MG tablet Take 1 tablet (20 mg total) by mouth daily. 30 tablet 0  . lactulose (CHRONULAC) 10 GM/15ML solution Take 30 mLs  (20 g total) by mouth 2 (two) times daily. 236 mL 0  . Magnesium 400 MG TABS Take 400 mg by mouth daily.    . metoprolol succinate (TOPROL-XL) 50 MG 24 hr tablet Take 50 mg by mouth daily.    . potassium chloride SA (KLOR-CON) 20 MEQ tablet Take 1 tablet (20 mEq total) by mouth daily. 30 tablet 0  . propafenone (RYTHMOL) 150 MG tablet Take 150 mg by mouth daily.    Marland Kitchen rOPINIRole (REQUIP) 0.5 MG tablet Take 1 mg by mouth at bedtime.      No current facility-administered medications for this visit.    Allergies:   Lisinopril   Social History:  The patient  reports that she has been smoking e-cigarettes. She has never used smokeless tobacco. She reports that she does not drink alcohol or use drugs.   Family History:  The patient's Family history is unknown by patient.  ROS:  Please see the history of present illness. All other systems are reviewed and otherwise negative.   PHYSICAL EXAM:  VS:  BP (!) 92/58   Pulse 61   Ht 5' 1.75" (1.568 m)   Wt 142 lb (64.4 kg)   SpO2 97%   BMI 26.18 kg/m  BMI: Body mass index is 26.18 kg/m. Well nourished, well developed, in no acute distress  HEENT: normocephalic, atraumatic  Neck: no JVD, carotid bruits or masses Cardiac:  RRR; loud holosystolic murmur radiates to neck b/l, no rubs, or gallops Lungs:  CTA b/l, no wheezing, rhonchi or rales  Abd: soft, nontender MS: no deformity, advanced atrophy Ext: no edema  Skin: warm and dry, no rash Neuro:  No gross deficits appreciated Psych: euthymic mood, full affect  PPM site is stable, has a vert small (17mm) superficial scb at the inferior edge of the incision towards lateral end.  Looks good, no erythema, edema, or fluctuation.  No tethering or discomfort   EKG:  Done today and reviewed by myself:  AP/VS, PR 232ms, QRS 170ms (device is programmed MVP)   PPM interrogation done today and reviewed by myself: Battery and lead measurements are good No AF AP 99.8%, VP <0.1%   10/16/2019:  TTE IMPRESSIONS 1. Left ventricular ejection fraction, by estimation, is 50%. The left  ventricle has mildly decreased function. The left ventricle demonstrates  global hypokinesis. There is mild left ventricular hypertrophy. Left  ventricular diastolic parameters are  indeterminate. Elevated left ventricular end-diastolic pressure.  2. Right ventricular systolic function is normal. The right ventricular  size is normal. There is mildly elevated pulmonary artery systolic  pressure. The estimated right ventricular systolic pressure is 37.9 mmHg.  3. Left atrial size was moderately dilated.  4. Right atrial size was mildly dilated.  5. The mitral valve is abnormal. Mild to moderate mitral valve  regurgitation. No evidence of mitral stenosis.  6. Tricuspid valve regurgitation is moderate.  7. The aortic valve is severely calcified with severely reduced leaflet  motion. The dimensionless index is 0.17, however LVOT TVI  may be  underestimated. 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 The aortic valve is  abnormal. Aortic valve regurgitation is not visualized. Aortic valve mean  gradient measures 21.0 mmHg.  8. The inferior vena cava is dilated in size with <50% respiratory  variability, suggesting right atrial pressure of 15 mmHg.   Recent Labs: 10/15/2019: B Natriuretic Peptide 1,346.3 10/16/2019: Magnesium 1.7; TSH 2.824 10/18/2019: ALT 35; Hemoglobin 13.2; Platelets 256 10/20/2019: BUN 8; Creatinine, Ser 0.64; Potassium 3.7; Sodium 139  No results found for requested labs within last 8760 hours.   Estimated Creatinine Clearance: 51.6 mL/min (by C-G formula based on SCr of 0.64 mg/dL).   Wt Readings from Last 3 Encounters:  11/05/19 142 lb (64.4 kg)  10/20/19 139 lb 4.8 oz (63.2 kg)     Other studies reviewed: Additional studies/records reviewed today include: summarized above  ASSESSMENT AND PLAN:  1. PPM     S/p gen change, intact  function     Site looks good     No programming changes made  2. Paroxysmal Afib     CHA2DS2Vasc is 6, on Eliquis, approproiately dosed     0% burden    3. HTN     Hypotensive here today, confirmed accurate by recheck manually by myself     Given her AS, avoid hypotension     Stop her amlodipine  They do not have home BP cuff Would like her to come back to have BP check in 2 weeks  4. Chronic CHF (diastolic) 5. VHD w/mond-severe AS     Planned for repeat echo in 38mo post discharge     No symptoms or exam findings to suggest volume OL today     BMET today     The husband reports that one of her medicines is a TID regime.  None of her current meds are listed as presrcibed TID by the list.  I suspect this may be her Rhythmol.  He is asked to look when they get home and call to let us know.              Disposition: Dr. Graciela Husbands as scheduled, remotes Q 3 months.  Dr. Ladean Raya in 6-8 weeks, sooner if needed, given recent HF hospitalization.    Current medicines are reviewed at length with the patient today.  The patient did not have any concerns regarding medicines.  Norma Fredrickson, PA-C 11/05/2019 2:19 PM     [facility] [address] [place] [address] [phone] (office)  [phone] (fax)

## 2019-11-05 NOTE — Addendum Note (Signed)
Addended by: Oleta Mouse on: 11/05/2019 04:17 PM   Modules accepted: Orders

## 2019-11-05 NOTE — Patient Instructions (Addendum)
Medication Instructions:    STOP TAKING AMLODIPINE   *If you need a refill on your cardiac medications before your next appointment, please call your pharmacy*   Lab Work: BMET  TODAY    If you have labs (blood work) drawn today and your tests are completely normal, you will receive your results only by: Marland Kitchen MyChart Message (if you have MyChart) OR . A paper copy in the mail If you have any lab test that is abnormal or we need to change your treatment, we will call you to review the results.   Testing/Procedures:  IN 6 MONTHS Your physician has requested that you have an echocardiogram. Echocardiography is a painless test that uses sound waves to create images of your heart. It provides your doctor with information about the size and shape of your heart and how well your heart's chambers and valves are working. This procedure takes approximately one hour. There are no restrictions for this procedure.   Follow-Up: At [facility], you and your health needs are our priority.  As part of our continuing mission to provide you with exceptional heart care, we have created designated Provider Care Teams.  These Care Teams include your primary Cardiologist (physician) and Advanced Practice Providers (APPs -  Physician Assistants and Nurse Practitioners) who all work together to provide you with the care you need, when you need it.  We recommend signing up for the patient portal called "MyChart".  Sign up information is provided on this After Visit Summary.  MyChart is used to connect with patients for Virtual Visits (Telemedicine).  Patients are able to view lab/test results, encounter notes, upcoming appointments, etc.  Non-urgent messages can be sent to your provider as well.   To learn more about what you can do with MyChart, go to [email or web address].    Your next appointment:    1. NURSE VISIT IN 2 WEEKS  FOR BLOOD PRESSURE CHECK ( DUE TO STOPPING AMLODIPINE)  2. IN 6 WEEKS DR  NAHSER/APP 6 WEEKS   AS SCHEDULED WITH DR Alonza Smoker  AUGUST  8-25 @ 2':15   Other Instructions  PLEASE CALL CLINIC BACK WITH MEDICATION NAME AND DOSE THAT TAKEN 3 TIMES A DAY

## 2019-11-06 ENCOUNTER — Ambulatory Visit: Payer: Medicare Other | Admitting: Physician Assistant

## 2019-11-06 LAB — BASIC METABOLIC PANEL
BUN/Creatinine Ratio: 20 (ref 12–28)
BUN: 17 mg/dL (ref 8–27)
CO2: 25 mmol/L (ref 20–29)
Calcium: 9.6 mg/dL (ref 8.7–10.3)
Chloride: 104 mmol/L (ref 96–106)
Creatinine, Ser: 0.87 mg/dL (ref 0.57–1.00)
GFR calc Af Amer: 74 mL/min/{1.73_m2} (ref >59)
GFR calc non Af Amer: 64 mL/min/{1.73_m2} (ref >59)
Glucose: 87 mg/dL (ref 65–99)
Potassium: 5 mmol/L (ref 3.5–5.2)
Sodium: 142 mmol/L (ref 134–144)

## 2019-11-07 NOTE — Telephone Encounter (Signed)
Spoke with pt, obtained serial number for Carelink monitor.  [id number]  Carelink updated with information.  Need to allow time for monitor to update.  Pt to send manual transmission on 6/14.

## 2019-11-11 ENCOUNTER — Telehealth: Payer: Self-pay | Admitting: Cardiovascular Disease

## 2019-11-11 NOTE — Telephone Encounter (Signed)
New message  Marchelle Folks (nurse) with [facility] is calling in to speak with a nurse to get clarification on patient's medication. Called triage was instructed to send a message for a call back.

## 2019-11-11 NOTE — Telephone Encounter (Signed)
Spoke with Marchelle Folks and Pt's husband is helping with meds and pt has been taking Propafenone 150 mg  tid discharge summary and office note states daily Will forward to Francis Dowse PA for clarification recent script states tid .Zack Seal

## 2019-11-11 NOTE — Telephone Encounter (Signed)
Transmission received 11/08/19, monitor now up to date. Normal PPM function. No alerts or episodes.

## 2019-11-12 ENCOUNTER — Ambulatory Visit: Payer: Medicare Other | Admitting: Neurology

## 2019-11-12 ENCOUNTER — Encounter: Payer: Self-pay | Admitting: Neurology

## 2019-11-13 ENCOUNTER — Other Ambulatory Visit: Payer: Self-pay

## 2019-11-13 ENCOUNTER — Telehealth: Payer: Self-pay

## 2019-11-13 ENCOUNTER — Encounter: Payer: Self-pay | Admitting: Family Medicine

## 2019-11-13 ENCOUNTER — Ambulatory Visit (INDEPENDENT_AMBULATORY_CARE_PROVIDER_SITE_OTHER): Payer: Medicare Other | Admitting: Family Medicine

## 2019-11-13 VITALS — BP 99/71 | HR 91 | Temp 97.3°F | Ht 61.0 in | Wt 146.0 lb

## 2019-11-13 DIAGNOSIS — M8000XA Age-related osteoporosis with current pathological fracture, unspecified site, initial encounter for fracture: Secondary | ICD-10-CM | POA: Insufficient documentation

## 2019-11-13 DIAGNOSIS — R413 Other amnesia: Secondary | ICD-10-CM

## 2019-11-13 DIAGNOSIS — I10 Essential (primary) hypertension: Secondary | ICD-10-CM

## 2019-11-13 DIAGNOSIS — R945 Abnormal results of liver function studies: Secondary | ICD-10-CM

## 2019-11-13 DIAGNOSIS — R7303 Prediabetes: Secondary | ICD-10-CM

## 2019-11-13 DIAGNOSIS — G2581 Restless legs syndrome: Secondary | ICD-10-CM

## 2019-11-13 DIAGNOSIS — K8020 Calculus of gallbladder without cholecystitis without obstruction: Secondary | ICD-10-CM

## 2019-11-13 DIAGNOSIS — F329 Major depressive disorder, single episode, unspecified: Secondary | ICD-10-CM | POA: Insufficient documentation

## 2019-11-13 DIAGNOSIS — Z8673 Personal history of transient ischemic attack (TIA), and cerebral infarction without residual deficits: Secondary | ICD-10-CM

## 2019-11-13 DIAGNOSIS — M170 Bilateral primary osteoarthritis of knee: Secondary | ICD-10-CM

## 2019-11-13 DIAGNOSIS — K769 Liver disease, unspecified: Secondary | ICD-10-CM

## 2019-11-13 DIAGNOSIS — M8000XS Age-related osteoporosis with current pathological fracture, unspecified site, sequela: Secondary | ICD-10-CM

## 2019-11-13 DIAGNOSIS — Z79899 Other long term (current) drug therapy: Secondary | ICD-10-CM

## 2019-11-13 HISTORY — DX: Restless legs syndrome: G25.81

## 2019-11-13 HISTORY — DX: Major depressive disorder, single episode, unspecified: F32.9

## 2019-11-13 HISTORY — DX: Age-related osteoporosis with current pathological fracture, unspecified site, initial encounter for fracture: M80.00XA

## 2019-11-13 HISTORY — DX: Prediabetes: R73.03

## 2019-11-13 HISTORY — DX: Other long term (current) drug therapy: Z79.899

## 2019-11-13 HISTORY — DX: Bilateral primary osteoarthritis of knee: M17.0

## 2019-11-13 MED ORDER — CLONAZEPAM 0.5 MG PO TABS: ORAL_TABLET | ORAL | 0 refills | Status: DC

## 2019-11-13 NOTE — Telephone Encounter (Signed)
Patient no show for new pt appt on 11/12/2019. 

## 2019-11-13 NOTE — Progress Notes (Signed)
6/17/202110:52 AM  Kaitlyn [name] Jan 22, 1941, 79 y.o., female [id number]  Chief Complaint  Patient presents with  . New Patient (Initial Visit)  . Knee Pain    > L   . restless legs    pain at night - takes advil nightly   . Fatigue    balance is better / brain function needs work     HPI:   Patient is a 79 y.o. female with past medical history significant for h/o CVA, Afib on eliquis, CHF, pacemaker in place for sick sinus syndrome, RLS, HTN, osteoporosis, prediabetes, insomnia, depression, primary OA both knees who presents today to establish care  Has established with cards Last Cards OV June 2021  Has been recovering from her CVA in Jan 2021, speech, swallowing and memory continue to improve, she is requesting speech therapy Previously an accountant  Used to be on prolia for her osteoporosis with h/o fracture Last injection march 2021 Dexa due nov 2021 Managed by Dr Phineas Inches ortho for knee pain, taking APAP, occ aleve  Tolerating celexa well, reports depression is well controlled Has been on clonazepam 0.5mg  at bedtime since 2019 PHQ9 noted  Reports RLS not well controlled Take ropironole 0.5mg  at bedtime  New found abnormal liver functions Normal ammonia Unclear reason, denies etoh   Depression screen [facility] 2/9 11/13/2019 11/13/2019  Decreased Interest 0 0  Down, Depressed, Hopeless 0 0  PHQ - 2 Score 0 0  Altered sleeping 2 -  Tired, decreased energy 1 -  Change in appetite 0 -  Feeling bad or failure about yourself  0 -  Trouble concentrating 2 -  Moving slowly or fidgety/restless 0 -  Suicidal thoughts 0 -  PHQ-9 Score 5 -  Difficult doing work/chores Not difficult at all -    Fall Risk  11/13/2019  Falls in the past year? 1  Number falls in past yr: 1  Injury with Fall? 0  Follow up Falls evaluation completed     Allergies  Allergen Reactions  . Lisinopril Cough         Prior to Admission medications   Medication Sig Start Date End  Date Taking? Authorizing Provider  apixaban (ELIQUIS) 5 MG TABS tablet Take 5 mg by mouth 2 (two) times daily.  07/21/19  Yes [provider]  citalopram (CELEXA) 10 MG tablet Take 10 mg by mouth daily. 08/15/19  Yes [provider]  clonazePAM (KLONOPIN) 0.5 MG tablet Take 0.5 mg by mouth at bedtime as needed for anxiety.  12/15/14  Yes [provider]  esomeprazole (NEXIUM) 20 MG capsule Take 20 mg by mouth daily at 12 noon.   Yes [provider]  famotidine (PEPCID) 10 MG tablet Take 1 tablet (10 mg total) by mouth daily. 10/21/19  Yes Regalado, Belkys A, MD  furosemide (LASIX) 20 MG tablet Take 1 tablet (20 mg total) by mouth daily. 11/05/19  Yes Sheilah Pigeon, PA-C  Magnesium 400 MG TABS Take 400 mg by mouth daily. 11/05/19  Yes Sheilah Pigeon, PA-C  metoprolol succinate (TOPROL-XL) 50 MG 24 hr tablet Take 50 mg by mouth daily. 07/21/19  Yes [provider]  potassium chloride SA (KLOR-CON) 20 MEQ tablet Take 1 tablet (20 mEq total) by mouth daily. 11/05/19  Yes Sheilah Pigeon, PA-C  propafenone (RYTHMOL) 150 MG tablet Take 1 tablet (150 mg total) by mouth 3 (three) times daily. 11/05/19  Yes Sheilah Pigeon, PA-C  rOPINIRole (REQUIP) 0.5 MG tablet  Take 1 mg by mouth at bedtime.  11/05/18  Yes [provider]  lactulose (CHRONULAC) 10 GM/15ML solution Take 30 mLs (20 g total) by mouth 2 (two) times daily. Patient not taking: Reported on 11/13/2019 10/20/19   Alba Cory, MD    Past Medical History:  Diagnosis Date  . A-fib (HCC)   . Arthritis    both knees  . Chronic pain   . Hypertension   . Presence of permanent cardiac pacemaker   . Stroke [facility])     Past Surgical History:  Procedure Laterality Date  . CERVICAL SPINE SURGERY    . PACEMAKER IMPLANT    . PPM GENERATOR CHANGEOUT N/A 10/17/2019   Procedure: PPM GENERATOR CHANGEOUT;  Surgeon: Duke Salvia, MD;  Location: [facility] INVASIVE CV LAB;  Service: Cardiovascular;   Laterality: N/A;  . shoulder replacement Left     Social History   Tobacco Use  . Smoking status: Current Every Day Smoker    Types: E-cigarettes  . Smokeless tobacco: Never Used  Substance Use Topics  . Alcohol use: Never    Family History  Family history unknown: Yes    Review of Systems  Constitutional: Negative for chills and fever.  Respiratory: Negative for cough and shortness of breath.   Cardiovascular: Negative for chest pain, palpitations and leg swelling.  Gastrointestinal: Negative for abdominal pain, nausea and vomiting.   Per hpi  OBJECTIVE:  Today's Vitals   11/13/19 1031  BP: 99/71  Pulse: 91  Temp: (!) 97.3 F (36.3 C)  SpO2: 97%  Weight: 146 lb (66.2 kg)  Height: 5\' 1"  (1.549 m)   Body mass index is 27.59 kg/m.  BP Readings from Last 3 Encounters:  11/13/19 99/71  11/05/19 (!) 92/58  10/20/19 94/74    Physical Exam Vitals and nursing note reviewed.  Constitutional:      Appearance: She is well-developed.  HENT:     Head: Normocephalic and atraumatic.     Mouth/Throat:     Pharynx: No oropharyngeal exudate.  Eyes:     General: No scleral icterus.    Conjunctiva/sclera: Conjunctivae normal.     Pupils: Pupils are equal, round, and reactive to light.  Cardiovascular:     Rate and Rhythm: Normal rate and regular rhythm.     Heart sounds: Normal heart sounds. No murmur heard.  No friction rub. No gallop.   Pulmonary:     Effort: Pulmonary effort is normal.     Breath sounds: Normal breath sounds. No wheezing, rhonchi or rales.  Musculoskeletal:     Cervical back: Neck supple.     Right lower leg: No edema.     Left lower leg: No edema.  Skin:    General: Skin is warm and dry.  Neurological:     Mental Status: She is alert and oriented to person, place, and time.    MMSE - Mini Mental State Exam 11/13/2019  Orientation to time 5  Orientation to Place 3  Registration 3  Attention/ Calculation 4  Recall 1  Language- name 2  objects 2  Language- repeat 1  Language- follow 3 step command 3  Language- read & follow direction 1  Write a sentence 1  Copy design 1  Total score 25   No results found for this or any previous visit (from the past 24 hour(s)).  No results found.   ASSESSMENT and PLAN  1. History of ischemic stroke 2. Memory changes On eliquis, LDL pending, no  motor deficits, referring to speech as requested. MMSE 25/30 - Ambulatory referral to Speech Therapy  3. RLS (restless legs syndrome) Check ferritin, if > 70 then will increase requip dose  4. Prediabetes - Hemoglobin A1c  5. Reactive depression Controlled. Continue current regime.  - Ferritin  6. Long term prescription benzodiazepine use Discussed concerns for bzd, will start very slow wean. Reviewed ssx to monitor.  7. Age-related osteoporosis with current pathological fracture, sequela Managed by Dr High Rolls Desanctis  8. Bilateral primary osteoarthritis of knee Managed by ortho  9. Abnormal liver function - Comprehensive metabolic panel - Hepatitis C antibody - Hepatitis B surface antigen - ANA w/Reflex if Positive - US Abdomen Limited RUQ; Future  10. Essential hypertension Stable. Managed by cards - Lipid panel  11. Liver disease, unspecified  - Ferritin  Other orders - clonazePAM (KLONOPIN) 0.5 MG tablet; Take 0.5mg  at bedtime M-Saturday, on Sundays take 0.25mg   Return in about 2 weeks (around 11/27/2019).    Rutherford Guys, MD Primary Care at [place], [place] [address] Ph.  [phone] Fax [phone]

## 2019-11-13 NOTE — Patient Instructions (Signed)
° ° ° °  If you have lab work done today you will be contacted with your lab results within the next 2 weeks.  If you have not heard from us then please contact us. The fastest way to get your results is to register for My Chart. ° ° °IF you received an x-ray today, you will receive an invoice from [facility]. Please contact [facility] at [phone] with questions or concerns regarding your invoice.  ° °IF you received labwork today, you will receive an invoice from [facility]. Please contact [facility] at [phone] with questions or concerns regarding your invoice.  ° °Our billing staff will not be able to assist you with questions regarding bills from these companies. ° °You will be contacted with the lab results as soon as they are available. The fastest way to get your results is to activate your My Chart account. Instructions are located on the last page of this paperwork. If you have not heard from us regarding the results in 2 weeks, please contact this office. °  ° ° ° °

## 2019-11-14 LAB — LIPID PANEL
Chol/HDL Ratio: 3.2 ratio (ref 0.0–4.4)
Cholesterol, Total: 139 mg/dL (ref 100–199)
HDL: 44 mg/dL (ref >39)
LDL Chol Calc (NIH): 74 mg/dL (ref 0–99)
Triglycerides: 113 mg/dL (ref 0–149)
VLDL Cholesterol Cal: 21 mg/dL (ref 5–40)

## 2019-11-14 LAB — COMPREHENSIVE METABOLIC PANEL
ALT: 32 IU/L (ref 0–32)
AST: 30 IU/L (ref 0–40)
Albumin/Globulin Ratio: 1.4 (ref 1.2–2.2)
Albumin: 3.6 g/dL — ABNORMAL LOW (ref 3.7–4.7)
Alkaline Phosphatase: 90 IU/L (ref 48–121)
BUN/Creatinine Ratio: 30 — ABNORMAL HIGH (ref 12–28)
BUN: 22 mg/dL (ref 8–27)
Bilirubin Total: 0.7 mg/dL (ref 0.0–1.2)
CO2: 23 mmol/L (ref 20–29)
Calcium: 10 mg/dL (ref 8.7–10.3)
Chloride: 105 mmol/L (ref 96–106)
Creatinine, Ser: 0.73 mg/dL (ref 0.57–1.00)
GFR calc Af Amer: 92 mL/min/{1.73_m2} (ref >59)
GFR calc non Af Amer: 80 mL/min/{1.73_m2} (ref >59)
Globulin, Total: 2.6 g/dL (ref 1.5–4.5)
Glucose: 165 mg/dL — ABNORMAL HIGH (ref 65–99)
Potassium: 4.6 mmol/L (ref 3.5–5.2)
Sodium: 141 mmol/L (ref 134–144)
Total Protein: 6.2 g/dL (ref 6.0–8.5)

## 2019-11-14 LAB — HEPATITIS B SURFACE ANTIGEN: Hepatitis B Surface Ag: NEGATIVE

## 2019-11-14 LAB — ANA W/REFLEX IF POSITIVE: Anti Nuclear Antibody (ANA): NEGATIVE

## 2019-11-14 LAB — HEMOGLOBIN A1C
Est. average glucose Bld gHb Est-mCnc: 120 mg/dL
Hgb A1c MFr Bld: 5.8 % — ABNORMAL HIGH (ref 4.8–5.6)

## 2019-11-14 LAB — FERRITIN: Ferritin: 22 ng/mL (ref 15–150)

## 2019-11-14 LAB — HEPATITIS C ANTIBODY: Hep C Virus Ab: <0.1 s/co ratio (ref 0.0–0.9)

## 2019-11-19 ENCOUNTER — Other Ambulatory Visit: Payer: Self-pay

## 2019-11-19 ENCOUNTER — Encounter (INDEPENDENT_AMBULATORY_CARE_PROVIDER_SITE_OTHER): Payer: Self-pay

## 2019-11-19 ENCOUNTER — Ambulatory Visit (INDEPENDENT_AMBULATORY_CARE_PROVIDER_SITE_OTHER): Payer: Medicare Other | Admitting: Physician Assistant

## 2019-11-19 ENCOUNTER — Other Ambulatory Visit: Payer: Medicare Other

## 2019-11-19 VITALS — BP 110/80 | HR 92 | Wt 146.0 lb

## 2019-11-19 DIAGNOSIS — I959 Hypotension, unspecified: Secondary | ICD-10-CM

## 2019-11-19 NOTE — Progress Notes (Signed)
Reason for visit:  B/P recheck d/t hypotensive during 11/05/2019 visit with Francis Dowse, PA-C.  H&P: Pt reports she is feeling well this afternoon.  Denies any dizziness, weakness or feelings of faintness.  ROS related to problem:Pt reports she has stopped her Amlodipine as instructed.  Assessment and plan:  Continue current medications and routine follow up.  Francis Dowse, PA-C will contact you if any changes are needed.  Pt verbalizes understanding and agrees with current plan.

## 2019-11-19 NOTE — Patient Instructions (Addendum)
Medication Instructions:  Your physician recommends that you continue on your current medications as directed. Please refer to the Current Medication list given to you today.  *If you need a refill on your cardiac medications before your next appointment, please call your pharmacy*   Lab Work: None ordered.  If you have labs (blood work) drawn today and your tests are completely normal, you will receive your results only by: Marland Kitchen MyChart Message (if you have MyChart) OR . A paper copy in the mail If you have any lab test that is abnormal or we need to change your treatment, we will call you to review the results.   Testing/Procedures: B/P recheck - 110/80     Follow-Up: At [facility], you and your health needs are our priority.  As part of our continuing mission to provide you with exceptional heart care, we have created designated Provider Care Teams.  These Care Teams include your primary Cardiologist (physician) and Advanced Practice Providers (APPs -  Physician Assistants and Nurse Practitioners) who all work together to provide you with the care you need, when you need it.  We recommend signing up for the patient portal called "MyChart".  Sign up information is provided on this After Visit Summary.  MyChart is used to connect with patients for Virtual Visits (Telemedicine).  Patients are able to view lab/test results, encounter notes, upcoming appointments, etc.  Non-urgent messages can be sent to your provider as well.   To learn more about what you can do with MyChart, go to [email or web address].    Continue current medications and routine follow up.  Francis Dowse, PA-C will call you if any changes are needed.

## 2019-11-19 NOTE — Telephone Encounter (Signed)
Called pt to see what medication she is needing but was unable to LVM bc the mailbox is full. If she cb can someone find out what medication she is needing.

## 2019-11-19 NOTE — Telephone Encounter (Signed)
Pt Husband called state she is out of all her med. [id number] - [place], [place] - [address] AT [facility] OF [facility] [address] Phone:  [phone]  Fax:  [phone]

## 2019-11-24 ENCOUNTER — Telehealth: Payer: Self-pay

## 2019-11-24 NOTE — Progress Notes (Signed)
Referral to gen surg is done

## 2019-11-24 NOTE — Addendum Note (Signed)
Addended by: Myles Lipps on: 11/24/2019 01:21 PM   Modules accepted: Orders

## 2019-11-27 ENCOUNTER — Ambulatory Visit (INDEPENDENT_AMBULATORY_CARE_PROVIDER_SITE_OTHER): Payer: Medicare Other

## 2019-11-27 ENCOUNTER — Encounter: Payer: Self-pay | Admitting: Family Medicine

## 2019-11-27 ENCOUNTER — Other Ambulatory Visit: Payer: Self-pay

## 2019-11-27 ENCOUNTER — Ambulatory Visit (INDEPENDENT_AMBULATORY_CARE_PROVIDER_SITE_OTHER): Payer: Medicare Other | Admitting: Family Medicine

## 2019-11-27 VITALS — BP 120/80 | HR 107 | Temp 97.3°F | Resp 14 | Ht 61.0 in | Wt 150.0 lb

## 2019-11-27 DIAGNOSIS — R062 Wheezing: Secondary | ICD-10-CM | POA: Diagnosis not present

## 2019-11-27 DIAGNOSIS — R2689 Other abnormalities of gait and mobility: Secondary | ICD-10-CM

## 2019-11-27 DIAGNOSIS — I5033 Acute on chronic diastolic (congestive) heart failure: Secondary | ICD-10-CM

## 2019-11-27 DIAGNOSIS — Z8673 Personal history of transient ischemic attack (TIA), and cerebral infarction without residual deficits: Secondary | ICD-10-CM

## 2019-11-27 MED ORDER — ESOMEPRAZOLE MAGNESIUM 20 MG PO CPDR: 20.0000 mg | DELAYED_RELEASE_CAPSULE | Freq: Two times a day (BID) | ORAL | 3 refills | Status: DC

## 2019-11-27 MED ORDER — ALBUTEROL SULFATE HFA 108 (90 BASE) MCG/ACT IN AERS: 2.0000 | INHALATION_SPRAY | Freq: Four times a day (QID) | RESPIRATORY_TRACT | 2 refills | Status: AC | PRN

## 2019-11-27 NOTE — Progress Notes (Signed)
7/1/202111:15 AM  Kaitlyn [name] Mar 13, 1941, 79 y.o., female [id number]  Chief Complaint  Patient presents with  . Cerebrovascular Accident    histroy of ischemic stroke, pt wife notes shes started wheezing, some weakness even in the mornings, states a nurse has been to the house but not consistantly, pt husband is concerned about her weight gain despite her decreased appetite.     HPI:   Patient is a 79 y.o. 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, RLS, HTN, osteoporosis, prediabetes, insomnia, depression, primary OA both knees  who presents today with concerns for wheezing and weight gain  Last OV 2 weeks ago to establish care, referred to speech - has not heard from them yet missed neurology appt (was not aware of appt) Saw cards a week ago - no changes to meds  Having problems with transitioning from sitting/standing - requesting referral to PT  Having wheezing when she tries to talk and at night She denies SOB but her husband noticed heavy breathing She reports chest tightness, she denies any chest pain She is working on quitting smoking Denies any swelling Urinating well Denies any orthopnea or PND Having reflux Denies any abd pain, nausea Having dysphagia and occ coughing when swallowing pills Denies any fever or chills   Wt Readings from Last 3 Encounters:  11/27/19 150 lb (68 kg)  11/19/19 146 lb (66.2 kg)  11/13/19 146 lb (66.2 kg)   BP Readings from Last 3 Encounters:  11/27/19 120/80  11/19/19 110/80  11/13/19 99/71    Depression screen PHQ 2/9 11/27/2019 11/13/2019 11/13/2019  Decreased Interest 0 0 0  Down, Depressed, Hopeless 0 0 0  PHQ - 2 Score 0 0 0  Altered sleeping - 2 -  Tired, decreased energy - 1 -  Change in appetite - 0 -  Feeling bad or failure about yourself  - 0 -  Trouble concentrating - 2 -  Moving slowly or fidgety/restless - 0 -  Suicidal thoughts - 0 -   PHQ-9 Score - 5 -  Difficult doing work/chores - Not difficult at all -    Fall Risk  11/27/2019 11/13/2019  Falls in the past year? 0 1  Number falls in past yr: - 1  Injury with Fall? - 0  Follow up Falls evaluation completed Falls evaluation completed     Allergies  Allergen Reactions  . Lisinopril Cough         Prior to Admission medications   Medication Sig Start Date End Date Taking? Authorizing Provider  apixaban (ELIQUIS) 5 MG TABS tablet Take 5 mg by mouth 2 (two) times daily.  07/21/19  Yes [provider]  citalopram (CELEXA) 10 MG tablet Take 10 mg by mouth daily. 08/15/19  Yes [provider]  clonazePAM (KLONOPIN) 0.5 MG tablet Take 0.5mg  at bedtime M-Saturday, on Sundays take 0.25mg  11/13/19  Yes Myles Lipps, MD  esomeprazole (NEXIUM) 20 MG capsule Take 20 mg by mouth daily at 12 noon.   Yes [provider]  famotidine (PEPCID) 10 MG tablet Take 1 tablet (10 mg total) by mouth daily. 10/21/19  Yes Regalado, Belkys A, MD  furosemide (LASIX) 20 MG tablet Take 1 tablet (20 mg total) by mouth daily. 11/05/19  Yes Sheilah Pigeon, PA-C  lactulose (CHRONULAC) 10 GM/15ML solution Take 30 mLs (20 g total) by mouth 2 (two) times daily. 10/20/19  Yes Regalado, Prentiss Bells, MD  Magnesium 400 MG TABS Take 400 mg by mouth daily. 11/05/19  Yes Sheilah Pigeon, PA-C  metoprolol succinate (TOPROL-XL) 50 MG 24 hr tablet Take 50 mg by mouth daily. 07/21/19  Yes [provider]  potassium chloride SA (KLOR-CON) 20 MEQ tablet Take 1 tablet (20 mEq total) by mouth daily. 11/05/19  Yes Sheilah Pigeon, PA-C  propafenone (RYTHMOL) 150 MG tablet Take 1 tablet (150 mg total) by mouth 3 (three) times daily. 11/05/19  Yes Sheilah Pigeon, PA-C  rOPINIRole (REQUIP) 0.5 MG tablet Take 1 mg by mouth at bedtime.  11/05/18  Yes [provider]    Past Medical History:  Diagnosis Date  . A-fib (HCC)   . Arthritis    both knees  . Chronic pain   .  Hypertension   . Presence of permanent cardiac pacemaker   . Stroke [facility])     Past Surgical History:  Procedure Laterality Date  . CERVICAL SPINE SURGERY    . PACEMAKER IMPLANT    . PPM GENERATOR CHANGEOUT N/A 10/17/2019   Procedure: PPM GENERATOR CHANGEOUT;  Surgeon: Duke Salvia, MD;  Location: [facility] INVASIVE CV LAB;  Service: Cardiovascular;  Laterality: N/A;  . shoulder replacement Left     Social History   Tobacco Use  . Smoking status: Current Every Day Smoker    Types: E-cigarettes  . Smokeless tobacco: Never Used  Substance Use Topics  . Alcohol use: Never    Family History  Family history unknown: Yes    ROS Per hpi  OBJECTIVE:  Today's Vitals   11/27/19 1035  BP: 120/80  Pulse: (!) 107  Resp: 14  Temp: (!) 97.3 F (36.3 C)  TempSrc: Temporal  SpO2: 93%  Weight: 150 lb (68 kg)  Height: 5\' 1"  (1.549 m)   Body mass index is 28.34 kg/m.   Physical Exam Vitals and nursing note reviewed.  Constitutional:      Appearance: She is well-developed.  HENT:     Head: Normocephalic and atraumatic.     Mouth/Throat:     Pharynx: No oropharyngeal exudate.  Eyes:     General: No scleral icterus.    Conjunctiva/sclera: Conjunctivae normal.     Pupils: Pupils are equal, round, and reactive to light.  Cardiovascular:     Rate and Rhythm: Normal rate and regular rhythm.     Heart sounds: Normal heart sounds. No murmur heard.  No friction rub. No gallop.   Pulmonary:     Effort: Pulmonary effort is normal.     Breath sounds: Normal breath sounds. No wheezing or rales.  Musculoskeletal:     Cervical back: Neck supple.     Right lower leg: Edema (+ 1 pitting edema) present.     Left lower leg: Edema present.  Skin:    General: Skin is warm and dry.  Neurological:     Mental Status: She is alert and oriented to person, place, and time.       No results found for this or any previous visit (from the past 24 hour(s)).  DG Chest 2 View  Result Date:  11/27/2019 CLINICAL DATA:  Wheezing. EXAM: CHEST - 2 VIEW COMPARISON:  Oct 15, 2019. FINDINGS: Stable cardiomegaly. Left-sided pacemaker is unchanged in position. No pneumothorax or pleural effusion is noted. Status post left shoulder arthroplasty. No acute pulmonary disease is noted. IMPRESSION: No active cardiopulmonary disease. Electronically Signed   By: Oct 17, 2019 M.D.   On: 11/27/2019 12:05  ASSESSMENT and PLAN  1. Wheezing None today on exam, breathing comfortable. Discussed ddx, increase PPI, trial of albuterol. Speech therapy arranged, patient/husband aware of appt - DG Chest 2 View  2. Acute on chronic diastolic congestive heart failure (HCC) Edema with recent weight gain. Increase lasix to 40mg  x 3 days then decrease to 20mg  daily. Fu with cards as scheduled.   3. History of ischemic stroke 4. Balance problems - Ambulatory referral to Physical Therapy  Other orders - esomeprazole (NEXIUM) 20 MG capsule; Take 1 capsule (20 mg total) by mouth 2 (two) times daily before a meal.  Return in about 4 weeks (around 12/25/2019).    , MD Primary Care at [facility] [address] [place], [address] [place] Ph.  [phone] Fax [phone]

## 2019-11-27 NOTE — Patient Instructions (Addendum)
[facility] (neurology) [phone]   [facility] [address], [address] [place],  [place]  [address]  Main: [phone] Appointment 11/28/2019 10:15am  Increase lasix to 40 mg once a day for 3 days, then decrease to 20mg  daily  If you have lab work done today you will be contacted with your lab results within the next 2 weeks.  If you have not heard from then please contact us. The fastest way to get your results is to register for My Chart.   IF you received an x-ray today, you will receive an invoice from [facility]. Please contact [facility] at [phone] with questions or concerns regarding your invoice.   IF you received labwork today, you will receive an invoice from [facility]. Please contact [facility] at [phone] with questions or concerns regarding your invoice.   Our billing staff will not be able to assist you with questions regarding bills from these companies.  You will be contacted with the lab results as soon as they are available. The fastest way to get your results is to activate your My Chart account. Instructions are located on the last page of this paperwork. If you have not heard from [phone] regarding the results in 2 weeks, please contact this office.

## 2019-11-28 ENCOUNTER — Ambulatory Visit: Payer: Medicare Other | Attending: Family Medicine

## 2019-11-28 DIAGNOSIS — R4701 Aphasia: Secondary | ICD-10-CM | POA: Diagnosis present

## 2019-11-28 DIAGNOSIS — R2689 Other abnormalities of gait and mobility: Secondary | ICD-10-CM | POA: Diagnosis present

## 2019-11-28 DIAGNOSIS — R41841 Cognitive communication deficit: Secondary | ICD-10-CM | POA: Diagnosis present

## 2019-11-28 DIAGNOSIS — R1310 Dysphagia, unspecified: Secondary | ICD-10-CM | POA: Insufficient documentation

## 2019-11-28 DIAGNOSIS — R262 Difficulty in walking, not elsewhere classified: Secondary | ICD-10-CM | POA: Diagnosis present

## 2019-11-28 DIAGNOSIS — R2681 Unsteadiness on feet: Secondary | ICD-10-CM | POA: Insufficient documentation

## 2019-11-28 DIAGNOSIS — R471 Dysarthria and anarthria: Secondary | ICD-10-CM | POA: Diagnosis not present

## 2019-11-28 DIAGNOSIS — M6281 Muscle weakness (generalized): Secondary | ICD-10-CM | POA: Insufficient documentation

## 2019-11-28 DIAGNOSIS — R29818 Other symptoms and signs involving the nervous system: Secondary | ICD-10-CM | POA: Insufficient documentation

## 2019-11-28 NOTE — Therapy (Signed)
[facility] [address] [place], [place], [address] Phone: [phone]   Fax:  [phone]  Speech Language Pathology Evaluation  Patient Details  Name: Kaitlyn [name] MRN: [id number] Date of Birth: 05-15-1941 Referring Provider (SLP): Koren Shiver, MD   Encounter Date: 11/28/2019   End of Session - 11/28/19 2311    Visit Number 1    Number of Visits 17    Date for SLP Re-Evaluation 02/26/20    SLP Start Time 1023    SLP Stop Time  1101    SLP Time Calculation (min) 38 min    Activity Tolerance Patient tolerated treatment well           Past Medical History:  Diagnosis Date  . A-fib (HCC)   . Arthritis    both knees  . Chronic pain   . Hypertension   . Presence of permanent cardiac pacemaker   . Stroke [facility])     Past Surgical History:  Procedure Laterality Date  . CERVICAL SPINE SURGERY    . PACEMAKER IMPLANT    . PPM GENERATOR CHANGEOUT N/A 10/17/2019   Procedure: PPM GENERATOR CHANGEOUT;  Surgeon: Duke Salvia, MD;  Location: [facility] INVASIVE CV LAB;  Service: Cardiovascular;  Laterality: N/A;  . shoulder replacement Left     There were no vitals filed for this visit.   Subjective Assessment - 11/28/19 1039    Subjective "My sounds are just so low." Pt also reports difficulty with word finding. Pt reports since time of SLP 10-18-19 her anomia has improved but her volume has not.    Currently in Pain? No/denies              SLP Evaluation [facility] - 11/28/19 1033      SLP Visit Information   SLP Received On 11/28/19    Referring Provider (SLP) Koren Shiver, MD    Onset Date January 2021    Medical Diagnosis rt CVA      Subjective   Patient/Family Stated Goal "Get louder."      General Information   HPI Pt with CVA January 2021, "interval development of a wedge-shaped infarct in the anterior rt MCA territory involving the inferior and middle frontal gyri, frontal opervulum, the anterior  suprainsular cortex, and subtending white matter. No hemmorhage. Pt is left handed and likely rt brain language dominant, explaining aphasia with rt CVA. Pt expresses a keen frustration today in the volume of her speech.       Prior Functional Status   Cognitive/Linguistic Baseline Within functional limits      Cognition   Overall Cognitive Status Impaired/Different from baseline    Area of Impairment Memory    Memory Decreased short-term memory      Auditory Comprehension   Overall Auditory Comprehension Appears within functional limits for tasks assessed      Verbal Expression   Overall Verbal Expression Appears within functional limits for tasks assessed      Oral Motor/Sensory Function   Overall Oral Motor/Sensory Function Impaired    Labial ROM Reduced right;Reduced left    Labial Strength Reduced    Labial Coordination Reduced    Lingual ROM Reduced right    Lingual Symmetry Within Functional Limits    Lingual Strength Reduced   reduced rt more than lt   Lingual Coordination Reduced      Motor Speech   Overall Motor Speech Impaired    Phonation Low vocal intensity   low to mid  60s dB average   Articulation Within functional limitis    Intelligibility Intelligible    Effective Techniques Increased vocal intensity   volume incr'd to average mid 60s dB in 90 seconds conversati                          SLP Education - 11/28/19 2310    Education Details take pills 1-2 at a time with a small sip of water and neutral head position, possible goals for dysarthria, possible to complete aphasia assessment later    Person(s) Educated Patient;Caregiver(s)    Methods Explanation    Comprehension Verbalized understanding            SLP Short Term Goals - 11/28/19 2314      SLP SHORT TERM GOAL #1   Title pt will undergo aphasia assessment PRN    Time 4    Period Weeks   or 9 sessions total, for all STGS   Status New      SLP SHORT TERM GOAL #2   Title pt  will produce "hey" or loud /a/ with average upper 80s dB in 3 sessions    Time 4    Period Weeks    Status New      SLP SHORT TERM GOAL #3   Title pt will engage in abdominal breathing 80% of the time at rest for 5 minutes x3 sessions    Time 4    Period Weeks    Status New      SLP SHORT TERM GOAL #4   Title pt will perform HEP for vocal tract strengthening with rare min A x3 sessions    Time 4    Period Weeks    Status New            SLP Long Term Goals - 11/28/19 2320      SLP LONG TERM GOAL #1   Title pt will demo volume in upper 60s dB in 10 minutes simple-mod complex conversation    Time 8    Period Weeks   or 17 total sessions, for all LTGs   Status New      SLP LONG TERM GOAL #2   Title pt will demo abdominal breathing 75% of the time in a 10 minutes conversation with rare min A for procedure    Time 8    Period Weeks    Status New      SLP LONG TERM GOAL #3   Title pt will demo understanding importance of swallow precautions with meds x3 sessions    Time 8    Period Weeks    Status New            Plan - 11/28/19 1142    Clinical Impression Statement Pt presents today complaining of and primarily concerned with 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 Pt reports her anomia has improved since hospitalization and now she notices her severely decr'd voice volume. SLP will formally assess pt's langauge skills PRN at a later date. Pt also reports occasional coughing with medication new since CVA. SLP suggested pt take 1-2 meds at a time and smaller sips, keeping head neutral. SLP to monitor pt swallowing status.    Speech Therapy Frequency 2x / week    Duration --   8 weeks or 17 total sessions   Treatment/Interventions Aspiration precaution training;Pharyngeal strengthening exercises;Diet toleration management by SLP;Language facilitation;Internal/external aids;Cueing hierarchy;Trials of upgraded  texture/liquids;Environmental  controls;Oral motor exercises;Functional tasks;SLP instruction and feedback;Patient/family education    Potential to Achieve Goals Good    SLP Home Exercise Plan provided today for "hey" and blowing    Consulted and Agree with Plan of Care Patient           Patient will benefit from skilled therapeutic intervention in order to improve the following deficits and impairments:   Dysarthria and anarthria  Aphasia  Dysphagia, unspecified type    Problem List Patient Active Problem List   Diagnosis Date Noted  . RLS (restless legs syndrome) 11/13/2019  . Prediabetes 11/13/2019  . Reactive depression 11/13/2019  . Long term prescription benzodiazepine use 11/13/2019  . Age-related osteoporosis with current pathological fracture 11/13/2019  . Bilateral primary osteoarthritis of knee 11/13/2019  . Acute exacerbation of CHF (congestive heart failure) (HCC) 10/15/2019  . PAF (paroxysmal atrial fibrillation) (HCC) 10/15/2019  . Essential hypertension 10/15/2019  . History of ischemic stroke 10/15/2019  . Acute CHF (congestive heart failure) (HCC) 10/15/2019    Hashir Deleeuw ,MS, CCC-SLP  11/28/2019, 11:31 PM  [facility] [address] [place], [place], [address] Phone: [phone]   Fax:  [phone]  Name: Kaitlyn [name] MRN: [id number] Date of Birth: 1940/11/10

## 2019-11-28 NOTE — Patient Instructions (Addendum)
  With your "breathalyzer", blow hard and quick like you are blowing birthday candles. 5 sets of 5, twice a day.  10 loud "hey" - feel strong in your belly!  TWICE A DAY   *Try taking pills 1-2 at a time, with smaller sips and keeping your head in neutral position when you swallow*

## 2019-12-02 ENCOUNTER — Ambulatory Visit: Payer: Medicare Other

## 2019-12-02 ENCOUNTER — Other Ambulatory Visit: Payer: Self-pay

## 2019-12-02 DIAGNOSIS — R1310 Dysphagia, unspecified: Secondary | ICD-10-CM

## 2019-12-02 DIAGNOSIS — R471 Dysarthria and anarthria: Secondary | ICD-10-CM

## 2019-12-02 DIAGNOSIS — R4701 Aphasia: Secondary | ICD-10-CM

## 2019-12-02 NOTE — Therapy (Signed)
[facility] [address] [place], [place], [address] Phone: [phone]   Fax:  [phone]  Speech Language Pathology Treatment  Patient Details  Name: Kaitlyn [name] MRN: [id number] Date of Birth: [birth year] Referring Provider (SLP): Koren Shiver, MD   Encounter Date: 12/02/2019   End of Session - 12/02/19 1656    Visit Number 2    Number of Visits 17    Date for SLP Re-Evaluation 02/26/20    SLP Start Time 1450    SLP Stop Time  1530    SLP Time Calculation (min) 40 min    Activity Tolerance Patient tolerated treatment well           Past Medical History:  Diagnosis Date  . A-fib (HCC)   . Arthritis    both knees  . Chronic pain   . Hypertension   . Presence of permanent cardiac pacemaker   . Stroke [facility])     Past Surgical History:  Procedure Laterality Date  . CERVICAL SPINE SURGERY    . PACEMAKER IMPLANT    . PPM GENERATOR CHANGEOUT N/A 10/17/2019   Procedure: PPM GENERATOR CHANGEOUT;  Surgeon: Duke Salvia, MD;  Location: [facility] INVASIVE CV LAB;  Service: Cardiovascular;  Laterality: N/A;  . shoulder replacement Left     There were no vitals filed for this visit.   Subjective Assessment - 12/02/19 1459    Subjective Pt's voice is stronger and louder today compared to date of eval.    Currently in Pain? No/denies                 ADULT SLP TREATMENT - 12/02/19 1500      General Information   Behavior/Cognition Alert;Cooperative;Pleasant mood      Treatment Provided   Treatment provided Cognitive-Linquistic      Cognitive-Linquistic Treatment   Treatment focused on Dysarthria    Skilled Treatment Pt reprots no more coughing with meds with administration 1-2 at a time with small sip and no "kicking head back". SLP inbasketed pt's referring MD Leretha Pol) and suggested a MBSS given pt report of coughing with liqiuds x1/day.. Discussed pt's HEP - suboptimal but a fair to good start with  HEP freqeuncy. SLP encouraged pt to perform HEP with frequency prescraibed. Loud "hey" with model from SLP average upper 70s (usually)dB-low 80s(rarely). SLP educated pt re: abdominal breathing (AB) with little sucess in seated. Took pt back to PT room for supine practice. With initial min-mod cues faded to SBA pt was successful using a large cup overturned on her abdomen to provide visual cues for rise and fall of abdominal musculature. SLP told pt to practice this just prior to her blowing practice.       Assessment / Recommendations / Plan   Plan Continue with current plan of care   SLP informed referring MD of MBSS suggestion     Progression Toward Goals   Progression toward goals Progressing toward goals            SLP Education - 12/02/19 1655    Education Details abdominal breathing (AB)    Person(s) Educated Patient;Caregiver(s)    Methods Explanation;Demonstration;Verbal cues    Comprehension Verbalized understanding;Returned demonstration;Verbal cues required;Need further instruction            SLP Short Term Goals - 12/02/19 1514      SLP SHORT TERM GOAL #1   Title pt will undergo aphasia assessment PRN    Time 4  Period Weeks   or 9 sessions total, for all STGS   Status On-going      SLP SHORT TERM GOAL #2   Title pt will produce "hey" or loud /a/ with average upper 80s dB in 3 sessions    Time 4    Period Weeks    Status On-going      SLP SHORT TERM GOAL #3   Title pt will engage in abdominal breathing 80% of the time at rest for 5 minutes x3 sessions    Time 4    Period Weeks    Status On-going      SLP SHORT TERM GOAL #4   Title pt will perform HEP for vocal tract strengthening with rare min A x3 sessions    Time 4    Period Weeks    Status On-going            SLP Long Term Goals - 12/02/19 1658      SLP LONG TERM GOAL #1   Title pt will demo volume in upper 60s dB in 10 minutes simple-mod complex conversation    Time 8    Period Weeks   or 17  total sessions, for all LTGs   Status On-going      SLP LONG TERM GOAL #2   Title pt will demo abdominal breathing 75% of the time in a 10 minutes conversation with rare min A for procedure    Time 8    Period Weeks    Status On-going      SLP LONG TERM GOAL #3   Title pt will demo understanding importance of swallow precautions with meds x3 sessions    Time 8    Period Weeks    Status On-going            Plan - 12/02/19 1656    Clinical Impression Statement Pt presents today with 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 Voice, generally, is stronger today than at day of eval. Pt reports her anomia has improved since hospitalization and now she notices her severely decr'd voice volume. SLP will formally assess pt's langauge skills PRN at a later date. Pt also reports coughing with meds is greatly reduced given SLP suggestion but pt states coughing with liquids at least QD. SLP inbasketd pt's referring MD and suggested MBSS. SLP to monitor pt swallowing status.Pt would cont to benefit from skilled ST targeting dysarthria, possible dysphagia and possible aphasia.    Speech Therapy Frequency 2x / week    Duration --   8 weeks or 17 total sessions   Treatment/Interventions Aspiration precaution training;Pharyngeal strengthening exercises;Diet toleration management by SLP;Language facilitation;Internal/external aids;Cueing hierarchy;Trials of upgraded texture/liquids;Environmental controls;Oral motor exercises;Functional tasks;SLP instruction and feedback;Patient/family education    Potential to Achieve Goals Good    SLP Home Exercise Plan provided today for "hey" and blowing    Consulted and Agree with Plan of Care Patient           Patient will benefit from skilled therapeutic intervention in order to improve the following deficits and impairments:   Dysarthria and anarthria  Aphasia  Dysphagia, unspecified type    Problem List Patient Active  Problem List   Diagnosis Date Noted  . RLS (restless legs syndrome) 11/13/2019  . Prediabetes 11/13/2019  . Reactive depression 11/13/2019  . Long term prescription benzodiazepine use 11/13/2019  . Age-related osteoporosis with current pathological fracture 11/13/2019  . Bilateral primary osteoarthritis of knee 11/13/2019  .  Acute exacerbation of CHF (congestive heart failure) (HCC) 10/15/2019  . PAF (paroxysmal atrial fibrillation) (HCC) 10/15/2019  . Essential hypertension 10/15/2019  . History of ischemic stroke 10/15/2019  . Acute CHF (congestive heart failure) (HCC) 10/15/2019    Zeanna Sunde ,MS, CCC-SLP  12/02/2019, 4:59 PM  [facility] [address] [place], [place], [address] Phone: [phone]   Fax:  [phone]   Name: Evolet G [name] MRN: [id number] Date of Birth: December 02, 1940

## 2019-12-02 NOTE — Patient Instructions (Signed)
Perform the breathing practice for 10 minutes before or after your blowing.  I recommended a swallow test to Dr. Leretha Pol, given you are coughing with liquids once a day.

## 2019-12-04 ENCOUNTER — Ambulatory Visit: Payer: Medicare Other

## 2019-12-04 ENCOUNTER — Other Ambulatory Visit: Payer: Self-pay

## 2019-12-04 ENCOUNTER — Other Ambulatory Visit: Payer: Self-pay | Admitting: Family Medicine

## 2019-12-04 DIAGNOSIS — R1310 Dysphagia, unspecified: Secondary | ICD-10-CM

## 2019-12-04 DIAGNOSIS — R471 Dysarthria and anarthria: Secondary | ICD-10-CM | POA: Diagnosis not present

## 2019-12-04 DIAGNOSIS — R4701 Aphasia: Secondary | ICD-10-CM

## 2019-12-04 DIAGNOSIS — Z8673 Personal history of transient ischemic attack (TIA), and cerebral infarction without residual deficits: Secondary | ICD-10-CM

## 2019-12-04 NOTE — Therapy (Signed)
[facility] [address] [place], [place], [address] Phone: [phone]   Fax:  [phone]  Speech Language Pathology Treatment  Patient Details  Name: Kaitlyn [name] MRN: [id number] Date of Birth: September 14, 1940 Referring Provider (SLP): Koren Shiver, MD   Encounter Date: 12/04/2019   End of Session - 12/04/19 1656    Visit Number 3    Number of Visits 17    Date for SLP Re-Evaluation 02/26/20    SLP Start Time 1403    SLP Stop Time  1445    SLP Time Calculation (min) 42 min    Activity Tolerance Patient tolerated treatment well           Past Medical History:  Diagnosis Date  . A-fib (HCC)   . Arthritis    both knees  . Chronic pain   . Hypertension   . Presence of permanent cardiac pacemaker   . Stroke [facility])     Past Surgical History:  Procedure Laterality Date  . CERVICAL SPINE SURGERY    . PACEMAKER IMPLANT    . PPM GENERATOR CHANGEOUT N/A 10/17/2019   Procedure: PPM GENERATOR CHANGEOUT;  Surgeon: Duke Salvia, MD;  Location: [facility] INVASIVE CV LAB;  Service: Cardiovascular;  Laterality: N/A;  . shoulder replacement Left     There were no vitals filed for this visit.   Subjective Assessment - 12/04/19 1408    Subjective Pt has not heard from MD about modified (MBSS).    Currently in Pain? No/denies                 ADULT SLP TREATMENT - 12/04/19 1409      General Information   Behavior/Cognition Alert;Cooperative;Pleasant mood      Treatment Provided   Treatment provided Cognitive-Linquistic      Cognitive-Linquistic Treatment   Treatment focused on Dysarthria    Skilled Treatment In conversation pt in mid 60s dB -functional in quiet environment. Pt with loud "hey!" average lower 80s dB. With SLP model, average low 90s dB. SLP told pt to practice this back and forth with her husband so she can practice louder.  Pt's average loudness was low 70s with word responses to personal [facility]  questions. In sentence responses pt req'd SLP occasional min cues for WNL loudness. Homework provided for sentence responses.      Assessment / Recommendations / Plan   Plan Continue with current plan of care      Progression Toward Goals   Progression toward goals Progressing toward goals              SLP Short Term Goals - 12/04/19 1657      SLP SHORT TERM GOAL #1   Title pt will undergo aphasia assessment PRN    Time 4    Period Weeks   or 9 sessions total, for all STGS   Status On-going      SLP SHORT TERM GOAL #2   Title pt will produce "hey" or loud /a/ with average upper 80s dB in 3 sessions    Baseline 12-04-19    Time 4    Period Weeks    Status On-going      SLP SHORT TERM GOAL #3   Title pt will engage in abdominal breathing 80% of the time at rest for 5 minutes x3 sessions    Time 4    Period Weeks    Status On-going      SLP SHORT TERM GOAL #  4   Title pt will perform HEP for vocal tract strengthening with rare min A x3 sessions    Time 4    Period Weeks    Status On-going            SLP Long Term Goals - 12/04/19 1657      SLP LONG TERM GOAL #1   Title pt will demo volume in upper 60s dB in 10 minutes simple-mod complex conversation    Time 8    Period Weeks   or 17 total sessions, for all LTGs   Status On-going      SLP LONG TERM GOAL #2   Title pt will demo abdominal breathing 75% of the time in a 10 minutes conversation with rare min A for procedure    Time 8    Period Weeks    Status On-going      SLP LONG TERM GOAL #3   Title pt will demo understanding importance of swallow precautions with meds x3 sessions    Time 8    Period Weeks    Status On-going            Plan - 12/04/19 1656    Clinical Impression Statement Pt presents today with 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 Voice strength continues to improve. Pt reports her anomia has improved since hospitalization and now she notices her severely  decr'd voice volume. SLP will formally assess pt's langauge skills PRN at a later date. Pt also reports coughing with meds is greatly reduced given SLP suggestion but pt states coughing with liquids at least QD. SLP inbasketd pt's referring MD and suggested MBSS. SLP to monitor pt swallowing status.Pt would cont to benefit from skilled ST targeting dysarthria, possible dysphagia and possible aphasia.    Speech Therapy Frequency 2x / week    Duration --   8 weeks or 17 total sessions   Treatment/Interventions Aspiration precaution training;Pharyngeal strengthening exercises;Diet toleration management by SLP;Language facilitation;Internal/external aids;Cueing hierarchy;Trials of upgraded texture/liquids;Environmental controls;Oral motor exercises;Functional tasks;SLP instruction and feedback;Patient/family education    Potential to Achieve Goals Good    SLP Home Exercise Plan provided today for "hey" and blowing    Consulted and Agree with Plan of Care Patient           Patient will benefit from skilled therapeutic intervention in order to improve the following deficits and impairments:   Dysarthria and anarthria  Dysphagia, unspecified type  Aphasia    Problem List Patient Active Problem List   Diagnosis Date Noted  . RLS (restless legs syndrome) 11/13/2019  . Prediabetes 11/13/2019  . Reactive depression 11/13/2019  . Long term prescription benzodiazepine use 11/13/2019  . Age-related osteoporosis with current pathological fracture 11/13/2019  . Bilateral primary osteoarthritis of knee 11/13/2019  . Acute exacerbation of CHF (congestive heart failure) (HCC) 10/15/2019  . PAF (paroxysmal atrial fibrillation) (HCC) 10/15/2019  . Essential hypertension 10/15/2019  . History of ischemic stroke 10/15/2019  . Acute CHF (congestive heart failure) (HCC) 10/15/2019    Natalie Leclaire ,MS, CCC-SLP  12/04/2019, 4:57 PM  [facility] [address] [place], [place], [address] Phone: [phone]   Fax:  [phone]   Name: Kaitlyn [name] MRN: [id number] Date of Birth: 1941-04-28

## 2019-12-05 ENCOUNTER — Other Ambulatory Visit (HOSPITAL_COMMUNITY): Payer: Self-pay

## 2019-12-05 DIAGNOSIS — R1310 Dysphagia, unspecified: Secondary | ICD-10-CM

## 2019-12-05 DIAGNOSIS — R059 Cough, unspecified: Secondary | ICD-10-CM

## 2019-12-09 ENCOUNTER — Ambulatory Visit: Payer: Medicare Other

## 2019-12-09 ENCOUNTER — Other Ambulatory Visit: Payer: Self-pay

## 2019-12-09 DIAGNOSIS — R4701 Aphasia: Secondary | ICD-10-CM

## 2019-12-09 DIAGNOSIS — R41841 Cognitive communication deficit: Secondary | ICD-10-CM

## 2019-12-09 DIAGNOSIS — R471 Dysarthria and anarthria: Secondary | ICD-10-CM

## 2019-12-09 DIAGNOSIS — R1310 Dysphagia, unspecified: Secondary | ICD-10-CM

## 2019-12-09 NOTE — Patient Instructions (Signed)
    I want you to write down when someone calls you so you can remember what they said, who is coming to pick things up, and when they are coming. This will help your memory.  I want [place] to help you with your medication box. She can explain how to do it and walk you through doing it until you get the hang of it.

## 2019-12-09 NOTE — Therapy (Signed)
[facility] [address] [place], [place], [address] Phone: [phone]   Fax:  [phone]  Speech Language Pathology Treatment  Patient Details  Name: Kaitlyn [name] MRN: [id number] Date of Birth: [birth year] Referring Provider (SLP): Koren Shiver, MD   Encounter Date: 12/09/2019   End of Session - 12/09/19 1650    Visit Number 4    Number of Visits 17    Date for SLP Re-Evaluation 02/26/20    SLP Start Time 0851    SLP Stop Time  0932    SLP Time Calculation (min) 41 min    Activity Tolerance Patient tolerated treatment well           Past Medical History:  Diagnosis Date  . A-fib (HCC)   . Arthritis    both knees  . Chronic pain   . Hypertension   . Presence of permanent cardiac pacemaker   . Stroke [facility])     Past Surgical History:  Procedure Laterality Date  . CERVICAL SPINE SURGERY    . PACEMAKER IMPLANT    . PPM GENERATOR CHANGEOUT N/A 10/17/2019   Procedure: PPM GENERATOR CHANGEOUT;  Surgeon: Duke Salvia, MD;  Location: [facility] INVASIVE CV LAB;  Service: Cardiovascular;  Laterality: N/A;  . shoulder replacement Left     There were no vitals filed for this visit.   Subjective Assessment - 12/09/19 0853    Subjective "I've got a problem with - my puppy. she wants to pee -on the treadmill."    Patient is accompained by: --   friend - [place]   Currently in Pain? No/denies                 ADULT SLP TREATMENT - 12/09/19 0854      General Information   Behavior/Cognition Alert;Cooperative;Pleasant mood      Treatment Provided   Treatment provided Cognitive-Linquistic      Cognitive-Linquistic Treatment   Treatment focused on Cognition    Skilled Treatment Pt states, "Somebody can come to get their stuff for their company and I have to ask them the name of their company - I can't remember it. Then they - leave and 5 minutes later I can't remember who it was who came." Pt reports she  can recall when to take meds but is not organizing her med box. States she needs to look at her calendar daily and sometimes multiple times a day to recall appoinotment days and times. Pt scored 0/1/2, 3/12, and 4/12 on Recall section; and 9 on Recognition. All scores were more than 2 standard deviaitions below the mean. SLP discussed pt keeping notes for her hpone calls during the day and [place] assisting with med box.       Assessment / Recommendations / Plan   Plan Goals updated      Progression Toward Goals   Progression toward goals Progressing toward goals            SLP Education - 12/09/19 1641    Education Details memory compensations    Person(s) Educated Patient;Caregiver(s)    Methods Explanation;Verbal cues    Comprehension Verbalized understanding;Need further instruction            SLP Short Term Goals - 12/09/19 1655      SLP SHORT TERM GOAL #1   Title pt will undergo aphasia assessment PRN    Time 3    Period Weeks   or 9 sessions total, for all STGS  Status On-going      SLP SHORT TERM GOAL #2   Title pt will produce "hey" or loud /a/ with average upper 80s dB in 3 sessions    Baseline 12-04-19    Time 3    Period Weeks    Status On-going      SLP SHORT TERM GOAL #3   Title pt will engage in abdominal breathing 80% of the time at rest for 5 minutes x3 sessions    Time 3    Period Weeks    Status On-going      SLP SHORT TERM GOAL #4   Title pt will perform HEP for vocal tract strengthening with rare min A x3 sessions    Time 3    Period Weeks    Status On-going      SLP SHORT TERM GOAL #5   Title pt will utilize memory compensation/s when appropriate at home, between 3 sessions    Time 3    Period Weeks    Status New            SLP Long Term Goals - 12/09/19 1656      SLP LONG TERM GOAL #1   Title pt will demo volume in upper 60s dB in 10 minutes simple-mod complex conversation    Time 7    Period Weeks   or 17 total sessions, for all  LTGs   Status On-going      SLP LONG TERM GOAL #2   Title pt will demo abdominal breathing 75% of the time in a 10 minutes conversation with rare min A for procedure    Time 7    Period Weeks    Status On-going      SLP LONG TERM GOAL #3   Title pt will demo understanding importance of swallow precautions with meds x3 sessions    Time 7    Period Weeks    Status On-going      SLP LONG TERM GOAL #4   Title pt will report better tracking of visitors and of conversations using memory compensations than in mid-July, during pt's last 2-3 visits    Time 7    Period Weeks    Status New            Plan - 12/09/19 1652    Clinical Impression Statement Pt presents today with 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 Voice strength continues to improve. Today pt endorses memory deficits affecting her ability to recall who just left the house for a visit or to retrieve something "not five minutes after they leave." SLP assessed pt's memory using Hopkins Verbal Learnenig Test and this suggests pt has encoding deficit. SLP explained use of a notebook to track calls and conversations pt has to compensatoe for her memory. Pt to begin to do this ASAP. SLP to add cog-comm deficit to pt's diagnoses. She cont to notice her severely decr'd voice volume. SLP may formally assess pt's langauge skills PRN at a later date. Pt also reports coughing with meds is greatly reduced given SLP suggestion but pt states coughing with liquids at least QD. SLP saw pt's MD has ordered modified. Pt would cont to benefit from skilled ST targeting memory enhancement, dysarthria, possible dysphagia and possible aphasia.    Speech Therapy Frequency 2x / week    Duration --   8 weeks or 17 total sessions   Treatment/Interventions Aspiration precaution training;Pharyngeal strengthening exercises;Diet toleration management by  SLP;Language facilitation;Internal/external aids;Cueing hierarchy;Trials of  upgraded texture/liquids;Environmental controls;Oral motor exercises;Functional tasks;SLP instruction and feedback;Patient/family education    Potential to Achieve Goals Good    SLP Home Exercise Plan provided today for "hey" and blowing    Consulted and Agree with Plan of Care Patient           Patient will benefit from skilled therapeutic intervention in order to improve the following deficits and impairments:   Dysarthria and anarthria  Aphasia  Dysphagia, unspecified type  Cognitive communication deficit    Problem List Patient Active Problem List   Diagnosis Date Noted  . RLS (restless legs syndrome) 11/13/2019  . Prediabetes 11/13/2019  . Reactive depression 11/13/2019  . Long term prescription benzodiazepine use 11/13/2019  . Age-related osteoporosis with current pathological fracture 11/13/2019  . Bilateral primary osteoarthritis of knee 11/13/2019  . Acute exacerbation of CHF (congestive heart failure) (HCC) 10/15/2019  . PAF (paroxysmal atrial fibrillation) (HCC) 10/15/2019  . Essential hypertension 10/15/2019  . History of ischemic stroke 10/15/2019  . Acute CHF (congestive heart failure) (HCC) 10/15/2019    Kyonna Frier ,MS, CCC-SLP  12/09/2019, 4:58 PM  [facility] [address] [place], [place], [address] Phone: [phone]   Fax:  [phone]   Name: Aalayah G [name] MRN: [id number] Date of Birth: Jun 14, 1940

## 2019-12-10 ENCOUNTER — Ambulatory Visit: Payer: Medicare Other

## 2019-12-10 ENCOUNTER — Ambulatory Visit: Payer: Medicare Other | Admitting: Physical Therapy

## 2019-12-10 DIAGNOSIS — R471 Dysarthria and anarthria: Secondary | ICD-10-CM | POA: Diagnosis not present

## 2019-12-10 DIAGNOSIS — R2689 Other abnormalities of gait and mobility: Secondary | ICD-10-CM

## 2019-12-10 DIAGNOSIS — M6281 Muscle weakness (generalized): Secondary | ICD-10-CM

## 2019-12-10 DIAGNOSIS — R262 Difficulty in walking, not elsewhere classified: Secondary | ICD-10-CM

## 2019-12-10 DIAGNOSIS — R1310 Dysphagia, unspecified: Secondary | ICD-10-CM

## 2019-12-10 DIAGNOSIS — R41841 Cognitive communication deficit: Secondary | ICD-10-CM

## 2019-12-10 DIAGNOSIS — R29818 Other symptoms and signs involving the nervous system: Secondary | ICD-10-CM

## 2019-12-10 DIAGNOSIS — R2681 Unsteadiness on feet: Secondary | ICD-10-CM

## 2019-12-10 DIAGNOSIS — R4701 Aphasia: Secondary | ICD-10-CM

## 2019-12-10 NOTE — Therapy (Signed)
[facility] [address] [place], [place], [address] Phone: [phone]   Fax:  [phone]  Physical Therapy Evaluation  Patient Details  Name: Kaitlyn [name] MRN: [id number] Date of Birth: April 15, 1941 Referring Provider (PT): Koren Shiver, MD   Encounter Date: 12/10/2019   PT End of Session - 12/10/19 1031    Visit Number 1    Number of Visits 17    Date for PT Re-Evaluation 03/09/20   written for 60 day POC   Authorization Type Medicare - 10th visit PN    PT Start Time 0931    PT Stop Time 1020    PT Time Calculation (min) 49 min    Equipment Utilized During Treatment Gait belt    Activity Tolerance Patient tolerated treatment well;Patient limited by fatigue    Behavior During Therapy [facility] for tasks assessed/performed           Past Medical History:  Diagnosis Date  . A-fib (HCC)   . Arthritis    both knees  . Chronic pain   . Hypertension   . Presence of permanent cardiac pacemaker   . Stroke [facility])     Past Surgical History:  Procedure Laterality Date  . CERVICAL SPINE SURGERY    . PACEMAKER IMPLANT    . PPM GENERATOR CHANGEOUT N/A 10/17/2019   Procedure: PPM GENERATOR CHANGEOUT;  Surgeon: Duke Salvia, MD;  Location: [facility] INVASIVE CV LAB;  Service: Cardiovascular;  Laterality: N/A;  . shoulder replacement Left     There were no vitals filed for this visit.    Subjective Assessment - 12/10/19 0936    Subjective Pt with CVA in January of 2021. Has OA in both knees, reports it is time for her to get her shots (normally gets a shot every 3 months). Has a really hard time getting up, especially after sitting for 20-30 minutes. Reports feeling weaker and balance is feeling off. Uses no AD. Pt also reports some wheezing, when she goes to sleep and turns over on her side.    Patient is accompained by: Family member   husband Kaitlyn [name], [place]   Pertinent History PMH: a fib, arthritis, chronic pain, HTN, CVA  (05/2019), acute CHF, pacemaker implant, hx of L shoulder replacement    Limitations Standing;Walking    How long can you sit comfortably? approx. 100-115' before getting tired    Diagnostic tests chest exam (for wheezing): Oct 15, 2019. FINDINGS: Stable cardiomegaly. Left-sided pacemaker is unchanged in position. No pneumothorax or pleural effusion is noted. Status post left shoulder arthroplasty. No acute pulmonary disease is noted.    Patient Stated Goals wants to get back to what she used to do, wants to take of herself    Currently in Pain? No/denies              [facility] PT Assessment - 12/10/19 0943      Assessment   Medical Diagnosis CVA   Jan 2021   Referring Provider (PT) Koren Shiver, MD    Onset Date/Surgical Date 06/12/19    Hand Dominance Left    Prior Therapy received HHPT after the CVA - approx. 6-8 times      Precautions   Precautions Fall      Balance Screen   Has the patient fallen in the past 6 months Yes    How many times? 1   was getting off couch and fell forward   Has the patient had a decrease in activity  level because of a fear of falling?  Yes   not bc of falling, bc she can't do things   Is the patient reluctant to leave their home because of a fear of falling?  No      Home Environment   Living Environment Private residence    Living Arrangements Spouse/significant other    Available Help at Discharge Family    Type of Home House    Home Access Stairs to enter    Entrance Stairs-Number of Steps 15    Entrance Stairs-Rails Can reach both    Home Layout One level    Home Equipment Shower seat      Prior Function   Level of Independence Independent with household mobility without device;Independent with basic ADLs    Vocation Retired    [employer] used to be an Musician, goes out to eat 2 times a week with her husband       Sensation   Light Touch Appears Intact      Coordination   Gross Motor  Movements are Fluid and Coordinated No    Heel Shin Test difficulty due to weakness, esp with LLE      Posture/Postural Control   Posture/Postural Control Postural limitations    Postural Limitations Rounded Shoulders;Forward head;Increased thoracic kyphosis      ROM / Strength   AROM / PROM / Strength Strength      Strength   Overall Strength Comments husband reports pt is picking L leg to get in and out of car    Strength Assessment Site Hip;Knee;Ankle    Right/Left Hip Right;Left    Right Hip Flexion 4-/5    Left Hip Flexion 3+/5    Right/Left Knee Right;Left    Right Knee Flexion 5/5    Right Knee Extension 5/5    Left Knee Flexion 4+/5    Left Knee Extension 4/5    Right/Left Ankle Right;Left    Right Ankle Dorsiflexion 4+/5    Left Ankle Dorsiflexion 4/5      Transfers   Transfers Stand to Sit;Sit to Stand    Sit to Stand 5: Supervision;4: Min guard;With upper extremity assist;From bed    Sit to Stand Details (indicate cue type and reason) 30.72 seconds, decr eccentric control back to mat table, during 2nd rep needing min guard for steadying for balance once in standing      Stand to Sit 5: Supervision;With upper extremity assist;To bed      Ambulation/Gait   Ambulation/Gait Yes    Ambulation/Gait Assistance 5: Supervision;4: Min guard    Ambulation Distance (Feet) 115 Feet   approx. clinic distances throughout eval   Assistive device None    Gait Pattern Step-through pattern;Decreased arm swing - right;Decreased arm swing - left;Decreased stride length;Decreased weight shift to left;Right foot flat;Left foot flat;Trunk flexed    Ambulation Surface Level;Indoor    Gait velocity 18.63 seconds = 1.76 ft/sec      Standardized Balance Assessment   Standardized Balance Assessment Timed Up and Go Test;Berg Balance Test      Berg Balance Test   Sit to Stand Able to stand  independently using hands    Standing Unsupported Able to stand safely 2 minutes    Sitting with Back  Unsupported but Feet Supported on Floor or Stool Able to sit safely and securely 2 minutes    Stand to Sit Controls descent by using hands    Transfers Able  to transfer safely, minor use of hands    Standing Unsupported with Eyes Closed Able to stand 10 seconds safely    Standing Unsupported with Feet Together Able to place feet together independently and stand 1 minute safely    From Standing, Reach Forward with Outstretched Arm Can reach forward >12 cm safely (5")    From Standing Position, Pick up Object from Floor Able to pick up shoe safely and easily    From Standing Position, Turn to Look Behind Over each Shoulder Looks behind one side only/other side shows less weight shift    Turn 360 Degrees Able to turn 360 degrees safely but slowly   7.63 to L, 8.16 to R   Standing Unsupported, Alternately Place Feet on Step/Stool Able to complete 4 steps without aid or supervision    Standing Unsupported, One Foot in Front Able to take small step independently and hold 30 seconds    Standing on One Leg Tries to lift leg/unable to hold 3 seconds but remains standing independently    Total Score 43    Berg comment: 43/56      Timed Up and Go Test   Normal TUG (seconds) 19.32   no AD                     Objective measurements completed on examination: See above findings.               PT Education - 12/10/19 1017    Education Details POC, clinical findings, fall risk    Person(s) Educated Patient;Spouse    Methods Explanation    Comprehension Verbalized understanding            PT Short Term Goals - 12/10/19 1039      PT SHORT TERM GOAL #1   Title Pt will be independent with initial HEP in order to build upon functional gains made in therapy. ALL STGS DUE 01/28/20    Time 7   due to delay in scheduling   Period Weeks    Status New    Target Date 01/28/20      PT SHORT TERM GOAL #2   Title Pt will undergo further assessment of stairs in order to enter/exit home  safely - LTG to be written as appropriate.    Time 7    Period Weeks    Status New      PT SHORT TERM GOAL #3   Title Pt will improve gait speed to at least 2.0 ft/sec in order to demo improved gait efficiency and decr fall risk.    Baseline 1.76 ft/sec    Time 7    Period Weeks    Status New      PT SHORT TERM GOAL #4   Title Pt will decr TUG to 17 seconds or less with no AD in order to decr fall risk.    Baseline 19.32 seconds    Time 7    Period Weeks    Status New      PT SHORT TERM GOAL #5   Title Pt will perform 5x sit <> stand with BUE support from mat table in 25 seconds or less in order to demo improved BLE strength for transfers.    Baseline 30.72 seconds BUE support    Time 7    Period Weeks    Status New             PT Long Term Goals - 12/10/19 1042  PT LONG TERM GOAL #1   Title Pt will be independent with final HEP in order to build upon functional gains made in therapy. ALL LTGS DUE 02/25/20    Time 11   due to delay in scheduling   Period Weeks    Status New    Target Date 02/25/20      PT LONG TERM GOAL #2   Title Pt will improve BERG score to at least a 48/56 in order to demo decr fall risk.    Baseline 43/56    Time 11    Period Weeks    Status New      PT LONG TERM GOAL #3   Title Stair goal to be written as appropriate in order for pt to safely enter/exit home.    Time 11    Period Weeks    Status New      PT LONG TERM GOAL #4   Title Pt will perform 5x sit <> stand with BUE vs. single UE support from mat table in 20 seconds or less in order to demo improved BLE strength for transfers.    Baseline 30.72 seconds BUE support    Time 11    Period Weeks    Status New      PT LONG TERM GOAL #5   Title Pt will decr TUG to 14.5 seconds or less with no AD in order to decr fall risk.    Baseline 19.32 seconds    Time 11    Period Weeks    Status New      Additional Long Term Goals   Additional Long Term Goals Yes      PT LONG TERM  GOAL #6   Title Pt will ambulate at least 300' indoors over level surfaces and outdoors over unlevel surfaces with supervision with no AD in order to demo improved community mobility.    Time 11    Period Weeks    Status New                  Plan - 12/10/19 1032    Clinical Impression Statement Patient is a 79 year old female referred to Neuro OPPT for balance and weakness s/p CVA (05/2019). Pt's PMH is significant for: a fib, arthritis, chronic pain, HTN, CVA (05/2019), acute CHF, pacemaker implant, hx of L shoulder replacement. The following deficits were present during the exam: incr forward flexed posture, impaired balance, gait abnormalities, decr coordination, decr endurance, decr BLE strength. Based on TUG, BERG, 5x sit <> stand, and gait speed pt is at a high risk for falls. Pt would benefit from skilled PT to address these impairments and functional55 limitations to maximize functional mobility independence    Personal Factors and Comorbidities Comorbidity 3+;Past/Current Experience;Time since onset of injury/illness/exacerbation    Comorbidities a fib, arthritis, chronic pain, HTN, CVA (05/2019), acute CHF, pacemaker implant, hx of L shoulder replacement    Examination-Activity Limitations Locomotion Level;Transfers;Squat;Stairs;Stand    Examination-Participation Restrictions Community Activity    Stability/Clinical Decision Making Stable/Uncomplicated    Clinical Decision Making Low    Rehab Potential Good    PT Frequency 2x / week    PT Duration 8 weeks    PT Treatment/Interventions ADLs/Self Care Home Management;DME Instruction;Gait training;Stair training;Functional mobility training;Therapeutic activities;Therapeutic exercise;Balance training;Patient/family education;Neuromuscular re-education;Vestibular;Energy conservation    PT Next Visit Plan initial HEP for functional BLE strength and balance. assess stairs    Consulted and Agree with Plan of Care Patient;Family  member/caregiver  Family Member Consulted pt's husband           Patient will benefit from skilled therapeutic intervention in order to improve the following deficits and impairments:  Abnormal gait, Decreased activity tolerance, Decreased balance, Decreased endurance, Decreased coordination, Decreased strength, Decreased range of motion, Difficulty walking, Postural dysfunction  Visit Diagnosis: Unsteadiness on feet  Difficulty in walking, not elsewhere classified  Other symptoms and signs involving the nervous system  Muscle weakness (generalized)  Other abnormalities of gait and mobility     Problem List Patient Active Problem List   Diagnosis Date Noted  . RLS (restless legs syndrome) 11/13/2019  . Prediabetes 11/13/2019  . Reactive depression 11/13/2019  . Long term prescription benzodiazepine use 11/13/2019  . Age-related osteoporosis with current pathological fracture 11/13/2019  . Bilateral primary osteoarthritis of knee 11/13/2019  . Acute exacerbation of CHF (congestive heart failure) (HCC) 10/15/2019  . PAF (paroxysmal atrial fibrillation) (HCC) 10/15/2019  . Essential hypertension 10/15/2019  . History of ischemic stroke 10/15/2019  . Acute CHF (congestive heart failure) (HCC) 10/15/2019    Drake Leach, PT, DPT  12/10/2019, 10:45 AM  [place] [facility] [address] [place], [place], [address] Phone: [phone]   Fax:  [phone]  Name: Vollie G [name] MRN: [id number] Date of Birth: January 05, 1941

## 2019-12-10 NOTE — Patient Instructions (Addendum)
  Memory Compensation Strategies  1. Use "WARM" strategy. W= write it down A=  associate it R=  repeat it M=  make a mental picture  2. You can keep a Glass blower/designer. Use a 3-ring notebook with sections for the following:  calendar, important names and phone numbers, medications, doctors' names/phone numbers, "to do list"/reminders, and a section to journal what you did each day  3. Use a calendar to write appointments down.  4. Write yourself a schedule for the day.  This can be placed on the calendar or in a separate section of the Memory Notebook.  Keeping a regular schedule can help memory.  5. Use medication organizer with sections for each day or morning/evening pills  You may need help loading it  6. Keep a basket, or pegboard by the door.   Place items that you need to take out with you in the basket or on the pegboard.  You may also want to include a message board for reminders.  7. Use sticky notes. Place sticky notes with reminders in a place where the task is performed.  For example:  "turn off the stove" placed by the stove, "lock the door" placed on the door at eye level, "take your medications" on the bathroom mirror or by the place where you normally take your medications  8. Use alarms/timers.  Use while cooking to remind yourself to check on food or as a reminder to take your medicine, or as a reminder to make a call, or as a reminder to perform another task, etc.  9. Use a small tape recorder to record important information and notes for yourself.  ===============================================================================  You will want to schedule yourself in such a way to PACE YOURSELF. You're sleepy/drowsy today because you probably did too much yesterday! I gave you some weekly schedule sheets - you and [name] or you and [place] can look at your week and schedule it out so that you pace yourself. Put these in your 3-ring binder.

## 2019-12-10 NOTE — Therapy (Signed)
[facility] [address] [place], [place], [address] Phone: [phone]   Fax:  [phone]  Speech Language Pathology Treatment  Patient Details  Name: Kaitlyn [name] MRN: [id number] Date of Birth: [birth year] Referring Provider (SLP): Koren Shiver, MD   Encounter Date: 12/10/2019   End of Session - 12/10/19 1304    Visit Number 4    Number of Visits 17    Date for SLP Re-Evaluation 02/26/20    SLP Start Time 0850    SLP Stop Time  0930    SLP Time Calculation (min) 40 min    Activity Tolerance Patient limited by fatigue           Past Medical History:  Diagnosis Date  . A-fib (HCC)   . Arthritis    both knees  . Chronic pain   . Hypertension   . Presence of permanent cardiac pacemaker   . Stroke [facility])     Past Surgical History:  Procedure Laterality Date  . CERVICAL SPINE SURGERY    . PACEMAKER IMPLANT    . PPM GENERATOR CHANGEOUT N/A 10/17/2019   Procedure: PPM GENERATOR CHANGEOUT;  Surgeon: Duke Salvia, MD;  Location: [facility] INVASIVE CV LAB;  Service: Cardiovascular;  Laterality: N/A;  . shoulder replacement Left     There were no vitals filed for this visit.   Subjective Assessment - 12/10/19 0924    Subjective Pt has not heard from scheduler about modified (MBSS). Told pt that if not hearing anything by next session to let SLP know.    Currently in Pain? No/denies                 ADULT SLP TREATMENT - 12/10/19 0001      General Information   Behavior/Cognition Alert;Cooperative;Pleasant mood;Lethargic      Treatment Provided   Treatment provided Cognitive-Linquistic      Cognitive-Linquistic Treatment   Treatment focused on Cognition;Patient/family/caregiver education    Skilled Treatment [place] assisted pt last night with medicine box. 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 Is drowsy today - somnolent requiring SLP to state pt's name to wake her  x4 during session. SLP talked with husband and [place] about pacing her self, planning out her week to have therapy and work on different days - SLP provided weekly planning sheets - SLP suggested to patient to put these weekly planning sheets in a 3-ring. Husband with questions of SLP if pt should be accessing computer files of clients - SLP told husband no problem with this but SLP suggests pt needs to do this work on days she does not have tx.       Assessment / Recommendations / Plan   Plan Continue with current plan of care      Progression Toward Goals   Progression toward goals Progressing toward goals            SLP Education - 12/10/19 0937    Education Details memory compensation handout, weekly planner for pacing activities during the week    Person(s) Educated Patient;Spouse;Caregiver(s)    Methods Explanation;Demonstration;Handout    Comprehension Verbalized understanding            SLP Short Term Goals - 12/10/19 1305      SLP SHORT TERM GOAL #1   Title pt will undergo aphasia assessment PRN    Time 3    Period Weeks   or 9 sessions total, for all  STGS   Status On-going      SLP SHORT TERM GOAL #2   Title pt will produce "hey" or loud /a/ with average upper 80s dB in 3 sessions    Baseline 12-04-19    Time 3    Period Weeks    Status On-going      SLP SHORT TERM GOAL #3   Title pt will engage in abdominal breathing 80% of the time at rest for 5 minutes x3 sessions    Time 3    Period Weeks    Status On-going      SLP SHORT TERM GOAL #4   Title pt will perform HEP for vocal tract strengthening with rare min A x3 sessions    Time 3    Period Weeks    Status On-going      SLP SHORT TERM GOAL #5   Title pt will utilize memory compensation/s when appropriate at home, between 3 sessions    Time 3    Period Weeks    Status On-going            SLP Long Term Goals - 12/10/19 1305      SLP LONG TERM GOAL #1   Title pt will demo volume in upper 60s dB  in 10 minutes simple-mod complex conversation    Time 7    Period Weeks   or 17 total sessions, for all LTGs   Status On-going      SLP LONG TERM GOAL #2   Title pt will demo abdominal breathing 75% of the time in a 10 minutes conversation with rare min A for procedure    Time 7    Period Weeks    Status On-going      SLP LONG TERM GOAL #3   Title pt will demo understanding importance of swallow precautions with meds x3 sessions    Time 7    Period Weeks    Status On-going      SLP LONG TERM GOAL #4   Title pt will report better tracking of visitors and of conversations using memory compensations than in mid-July, during pt's last 2-3 visits    Time 7    Period Weeks    Status On-going            Plan - 12/10/19 1305    Clinical Impression Statement Pt presents today with 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 Voice strength continues to improve. Today pt endorses memory deficits affecting her ability to recall who just left the house for a visit or to retrieve something "not five minutes after they leave." SLP assessed pt's memory using Hopkins Verbal Learnenig Test and this suggests pt has encoding deficit. SLP explained use of a notebook to track calls and conversations pt has to compensatoe for her memory. Pt to begin to do this ASAP. SLP to add cog-comm deficit to pt's diagnoses. She cont to notice her severely decr'd voice volume. SLP may formally assess pt's langauge skills PRN at a later date. Pt also reports coughing with meds is greatly reduced given SLP suggestion but pt states coughing with liquids at least QD. SLP saw pt's MD has ordered modified. Pt would cont to benefit from skilled ST targeting memory enhancement, dysarthria, possible dysphagia and possible aphasia.    Speech Therapy Frequency 2x / week    Duration --   8 weeks or 17 total sessions   Treatment/Interventions Aspiration precaution training;Pharyngeal strengthening  exercises;Diet toleration management by SLP;Language facilitation;Internal/external aids;Cueing hierarchy;Trials of upgraded texture/liquids;Environmental controls;Oral motor exercises;Functional tasks;SLP instruction and feedback;Patient/family education    Potential to Achieve Goals Good    SLP Home Exercise Plan provided today for "hey" and blowing    Consulted and Agree with Plan of Care Patient           Patient will benefit from skilled therapeutic intervention in order to improve the following deficits and impairments:   Dysarthria and anarthria  Dysphagia, unspecified type  Cognitive communication deficit  Aphasia    Problem List Patient Active Problem List   Diagnosis Date Noted  . RLS (restless legs syndrome) 11/13/2019  . Prediabetes 11/13/2019  . Reactive depression 11/13/2019  . Long term prescription benzodiazepine use 11/13/2019  . Age-related osteoporosis with current pathological fracture 11/13/2019  . Bilateral primary osteoarthritis of knee 11/13/2019  . Acute exacerbation of CHF (congestive heart failure) (HCC) 10/15/2019  . PAF (paroxysmal atrial fibrillation) (HCC) 10/15/2019  . Essential hypertension 10/15/2019  . History of ischemic stroke 10/15/2019  . Acute CHF (congestive heart failure) (HCC) 10/15/2019    Schwanda Zima ,MS, CCC-SLP  12/10/2019, 1:06 PM  [facility] [address] [place], [place], [address] Phone: [phone]   Fax:  [phone]   Name: Lizzete G [name] MRN: [id number] Date of Birth: [birth year]

## 2019-12-15 ENCOUNTER — Ambulatory Visit: Payer: Medicare Other

## 2019-12-15 ENCOUNTER — Other Ambulatory Visit: Payer: Self-pay

## 2019-12-15 ENCOUNTER — Encounter: Payer: Self-pay | Admitting: Cardiovascular Disease

## 2019-12-15 DIAGNOSIS — R471 Dysarthria and anarthria: Secondary | ICD-10-CM

## 2019-12-15 DIAGNOSIS — R4701 Aphasia: Secondary | ICD-10-CM

## 2019-12-15 DIAGNOSIS — R1310 Dysphagia, unspecified: Secondary | ICD-10-CM

## 2019-12-15 DIAGNOSIS — R41841 Cognitive communication deficit: Secondary | ICD-10-CM

## 2019-12-15 NOTE — Progress Notes (Signed)
Cardiology Office Note:    Date:  12/16/2019   ID:  Kaitlyn [name], DOB 1941-05-01, MRN [id number]  PCP:  Rutherford Guys, MD  [facility] Cardiologist:  Mertie Moores, MD  [place] Electrophysiologist:  None   Referring MD: No ref. provider found   Chief Complaint  Patient presents with  . Atrial Fibrillation    History of Present Illness:    Kaitlyn [name] is a 79 y.o. female with a hx of paroxysmal atrial fibrillation, history of GI bleed, aortic stenosis, pacemaker implantation.  She has moderate to severe aortic stenosis by echo previously in the [place] system.  I met her in the hospital in May, 2021 when she presented with 3 days of progressive lower extremity edema and as well as increasing shortness of breath.  She had mildly elevated troponin levels.  She had a generator change on May 21.  Hx of Afib,  S/p recent stroke.  VSD, DVT HTN  Has been getting speech therapy - swallow eval / coaching  Has had a swallowing study.   Found to have very slow transition of food and liquids. She gets choked with eating   Echo from May, 2021 shows EF 50%.  Mild - moderate pulmonary HTN - est PA pressure of 40 Moderate AS - mean AV gradeitn of 21 mmhg .  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 A-fib ([place])   . Arthritis    both knees  . Chronic pain   . Hypertension   . Presence of permanent cardiac pacemaker   . Stroke [facility])     Past Surgical History:  Procedure Laterality Date  . CERVICAL SPINE SURGERY    . PACEMAKER IMPLANT    . PPM GENERATOR CHANGEOUT N/A 10/17/2019   Procedure: PPM GENERATOR CHANGEOUT;  Surgeon: Deboraha Sprang, MD;  Location: [facility] CV LAB;  Service: Cardiovascular;  Laterality: N/A;  . shoulder replacement Left     Current Medications: Current Meds  Medication Sig  . albuterol (VENTOLIN HFA) 108 (90 Base) MCG/ACT inhaler Inhale 2 puffs into the lungs every 6 (six) hours as needed for  wheezing or shortness of breath.  Marland Kitchen apixaban (ELIQUIS) 5 MG TABS tablet Take 5 mg by mouth 2 (two) times daily.   . citalopram (CELEXA) 10 MG tablet Take 10 mg by mouth daily.  . clonazePAM (KLONOPIN) 0.5 MG tablet Take 0.16m at bedtime M-Saturday, on Sundays take 0.24m . esomeprazole (NEXIUM) 20 MG capsule Take 1 capsule (20 mg total) by mouth 2 (two) times daily before a meal.  . famotidine (PEPCID) 10 MG tablet Take 1 tablet (10 mg total) by mouth daily.  . furosemide (LASIX) 20 MG tablet Take 1 tablet (20 mg total) by mouth daily.  . Marland Kitchenactulose (CHRONULAC) 10 GM/15ML solution Take 30 mLs (20 g total) by mouth 2 (two) times daily.  . Magnesium 400 MG TABS Take 400 mg by mouth daily.  . metoprolol succinate (TOPROL-XL) 50 MG 24 hr tablet Take 50 mg by mouth daily.  . potassium chloride SA (KLOR-CON) 20 MEQ tablet Take 1 tablet (20 mEq total) by mouth daily.  . propafenone (RYTHMOL) 150 MG tablet Take 150 mg by mouth daily in the afternoon.  . Marland KitchenOPINIRole (REQUIP) 0.5 MG tablet Take 1 mg by mouth at bedtime.      Allergies:   Lisinopril   Social History   Socioeconomic History  . Marital status: Married  Spouse name: Not on file  . Number of children: Not on file  . Years of education: Not on file  . Highest education level: Not on file  Occupational History  . Not on file  Tobacco Use  . Smoking status: Current Every Day Smoker    Types: E-cigarettes  . Smokeless tobacco: Never Used  Vaping Use  . Vaping Use: Every day  . Start date: 10/27/2013  Substance and Sexual Activity  . Alcohol use: Never  . Drug use: Never  . Sexual activity: Not on file  Other Topics Concern  . Not on file  Social History Narrative  . Not on file   Social Determinants of Health   Financial Resource Strain:   . Difficulty of Paying Living Expenses:   Food Insecurity:   . Worried About Charity fundraiser in the Last Year:   . Arboriculturist in the Last Year:   Transportation Needs:   .  Film/video editor (Medical):   Marland Kitchen Lack of Transportation (Non-Medical):   Physical Activity:   . Days of Exercise per Week:   . Minutes of Exercise per Session:   Stress:   . Feeling of Stress :   Social Connections:   . Frequency of Communication with Friends and Family:   . Frequency of Social Gatherings with Friends and Family:   . Attends Religious Services:   . Active Member of Clubs or Organizations:   . Attends Archivist Meetings:   Marland Kitchen Marital Status:      Family History: The patient's family history includes Alzheimer's disease in her mother; Stroke in her father.  ROS:   Please see the history of present illness.     All other systems reviewed and are negative.  EKGs/Labs/Other Studies Reviewed:    The following studies were reviewed today:   EKG:    Recent Labs: 10/15/2019: B Natriuretic Peptide 1,346.3 10/16/2019: Magnesium 1.7; TSH 2.824 10/18/2019: Hemoglobin 13.2; Platelets 256 11/13/2019: ALT 32; BUN 22; Creatinine, Ser 0.73; Potassium 4.6; Sodium 141  Recent Lipid Panel    Component Value Date/Time   CHOL 139 11/13/2019 1136   TRIG 113 11/13/2019 1136   HDL 44 11/13/2019 1136   CHOLHDL 3.2 11/13/2019 1136   LDLCALC 74 11/13/2019 1136    Physical Exam:    VS:  BP 102/70 (BP Location: Left Arm, Patient Position: Sitting, Cuff Size: Normal)   Ht _0  (1.549 m)   Wt 148 lb 6.4 oz (67.3 kg)   SpO2 98%   BMI 28.04 kg/m     Wt Readings from Last 3 Encounters:  12/16/19 148 lb 6.4 oz (67.3 kg)  11/27/19 150 lb (68 kg)  11/19/19 146 lb (66.2 kg)     GEN: Elderly female, appears fairly frail.  She is status post stroke. HEENT: Normal NECK: No JVD; No carotid bruits LYMPHATICS: No lymphadenopathy CARDIAC: Irregularly irregular.  She has a 2/6 systolic murmur the left sternal border RESPIRATORY: Chronic rales and rhonchi in her right base. ABDOMEN: Soft, non-tender, non-distended MUSCULOSKELETAL:  No edema; No deformity  SKIN: Warm and  dry NEUROLOGIC: Status post stroke.  She is weak and standing up.  Her balance is not very good. PSYCHIATRIC:  Normal affect   ASSESSMENT:    No diagnosis found. PLAN:    In order of problems listed above:  1. Aortic stenosis: Cassidey has at least moderate aortic stenosis and might have moderate to severe aortic stenosis.  At present she  is in no condition to have aortic valve replacement either with standard approach or with TAVR.  She had a stroke in March and since that time her overall health has gradually and steadily declined.  She has chronic aspiration -apparently she aspirates quite a bit at night..  .  She has been working with the Electrical engineer on a regular basis. She still has chronic rales and rhonchi in her left base.  I told her and her husband that at this time she does not need a TAVR but before we even consider TAVR she will need to get a lot stronger and be in much better health.  She still has chronic aspiration and is at risk for infection.  Right now she does not seem to be limited by her aortic stenosis.  We will continue to follow along.  I will see her again in 6 months for follow-up visit.  2.  H fibrillation: Continue Eliquis    Medication Adjustments/Labs and Tests Ordered: Current medicines are reviewed at length with the patient today.  Concerns regarding medicines are outlined above.  No orders of the defined types were placed in this encounter.  No orders of the defined types were placed in this encounter.   Patient Instructions  Medication Instructions:  Your physician recommends that you continue on your current medications as directed. Please refer to the Current Medication list given to you today.  *If you need a refill on your cardiac medications before your next appointment, please call your pharmacy*   Lab Work: none If you have labs (blood work) drawn today and your tests are completely normal, you will receive your results only  by: Marland Kitchen MyChart Message (if you have MyChart) OR . A paper copy in the mail If you have any lab test that is abnormal or we need to change your treatment, we will call you to review the results.   Testing/Procedures: none   Follow-Up: At [facility], you and your health needs are our priority.  As part of our continuing mission to provide you with exceptional heart care, we have created designated Provider Care Teams.  These Care Teams include your primary Cardiologist (physician) and Advanced Practice Providers (APPs -  Physician Assistants and Nurse Practitioners) who all work together to provide you with the care you need, when you need it.  We recommend signing up for the patient portal called "MyChart".  Sign up information is provided on this After Visit Summary.  MyChart is used to connect with patients for Virtual Visits (Telemedicine).  Patients are able to view lab/test results, encounter notes, upcoming appointments, etc.  Non-urgent messages can be sent to your provider as well.   To learn more about what you can do with MyChart, go to [email or web address].    Your next appointment:   6 month(s)  The format for your next appointment:   In Person  Provider:   Mertie Moores, MD   Other Instructions      Signed, Mertie Moores, MD  12/16/2019 6:10 PM    [place]

## 2019-12-15 NOTE — Therapy (Signed)
[facility] [address] [place], [place], [address] Phone: [phone]   Fax:  [phone]  Speech Language Pathology Treatment  Patient Details  Name: Kaitlyn [name] MRN: [id number] Date of Birth: 08/26/1940 Referring Provider (SLP): Koren Shiver, MD   Encounter Date: 12/15/2019   End of Session - 12/15/19 1234    Visit Number 5    Number of Visits 17    Date for SLP Re-Evaluation 02/26/20    SLP Start Time 0851    SLP Stop Time  0931    SLP Time Calculation (min) 40 min    Activity Tolerance Patient tolerated treatment well           Past Medical History:  Diagnosis Date  . A-fib (HCC)   . Arthritis    both knees  . Chronic pain   . Hypertension   . Presence of permanent cardiac pacemaker   . Stroke [facility])     Past Surgical History:  Procedure Laterality Date  . CERVICAL SPINE SURGERY    . PACEMAKER IMPLANT    . PPM GENERATOR CHANGEOUT N/A 10/17/2019   Procedure: PPM GENERATOR CHANGEOUT;  Surgeon: Duke Salvia, MD;  Location: [facility] INVASIVE CV LAB;  Service: Cardiovascular;  Laterality: N/A;  . shoulder replacement Left     There were no vitals filed for this visit.   Subjective Assessment - 12/15/19 0858    Subjective Pt continues to go down PT gym hallway instead of down the hall towards ST rooms if not cued where to go from lobby.    Patient is accompained by: --   [place] - friend   Currently in Pain? No/denies                 ADULT SLP TREATMENT - 12/15/19 0900      General Information   Behavior/Cognition Alert;Cooperative;Pleasant mood      Treatment Provided   Treatment provided Cognitive-Linquistic      Cognitive-Linquistic Treatment   Treatment focused on Dysarthria    Skilled Treatment Pt was measured today for use with expiratory muscle trainer - MaxExpiratoryPressure (MEP) was measured at 36 cm H2O (LLN=51). An EMST was provided (clear and blue plastic Vear Clock  version), set at 13 cm H2O as pt ID'd effort level of 8/10 at that level. SLP began with the blue EMST device but pt could not generate any airflow at all with this device. Pt's MaxInspratoryPressure (MIP) was also measured today, at 16 cm H2O (LLN=24)- an IMST will be provided next session. SLP ensured pt friend was comfortable with assisting pt and she indicated she was, and that she would educate husband on procedure. Pt to perform 25 reps BID (5 sets of 5).       Assessment / Recommendations / Plan   Plan Continue with current plan of care      Progression Toward Goals   Progression toward goals Progressing toward goals            SLP Education - 12/15/19 1234    Education Details how to use EMST device    Person(s) Educated Patient;Caregiver(s)    Methods Explanation;Demonstration;Verbal cues    Comprehension Verbalized understanding;Returned demonstration;Verbal cues required;Need further instruction            SLP Short Term Goals - 12/15/19 1237      SLP SHORT TERM GOAL #1   Title pt will undergo aphasia assessment PRN    Time 2    Period  Weeks   or 9 sessions total, for all STGS   Status On-going      SLP SHORT TERM GOAL #2   Title pt will produce "hey" or loud /a/ with average upper 80s dB in 3 sessions    Baseline 12-04-19    Time 2    Period Weeks    Status On-going      SLP SHORT TERM GOAL #3   Title pt will engage in abdominal breathing 80% of the time at rest for 5 minutes x3 sessions    Time 2    Period Weeks    Status On-going      SLP SHORT TERM GOAL #4   Title pt will perform HEP for vocal tract strengthening with rare min A x3 sessions    Time 2    Period Weeks    Status On-going      SLP SHORT TERM GOAL #5   Title pt will utilize memory compensation/s when appropriate at home, between 3 sessions    Baseline 12-15-19    Time 2    Period Weeks    Status On-going            SLP Long Term Goals - 12/15/19 1237      SLP LONG TERM GOAL #1    Title pt will demo volume in upper 60s dB in 10 minutes simple-mod complex conversation    Time 6    Period Weeks   or 17 total sessions, for all LTGs   Status On-going      SLP LONG TERM GOAL #2   Title pt will demo abdominal breathing 75% of the time in a 10 minutes conversation with rare min A for procedure    Time 6    Period Weeks    Status On-going      SLP LONG TERM GOAL #3   Title pt will demo understanding importance of swallow precautions with meds x3 sessions    Time 6    Period Weeks    Status On-going      SLP LONG TERM GOAL #4   Title pt will report better tracking of visitors and of conversations using memory compensations than in mid-July, during pt's last 2-3 visits    Time 6    Period Weeks    Status On-going      SLP LONG TERM GOAL #5   Title pt will perform repiratory muscle training tasks with modified independence in 3 sessions    Time 6    Period Weeks    Status New      Additional Long Term Goals   Additional Long Term Goals Yes      SLP LONG TERM GOAL #6   Title pt's max expiratory pressure will be >36 dm H2O    Time 6    Period Weeks    Status New            Plan - 12/15/19 1234    Clinical Impression Statement Pt presents today with 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 Voice strength continues to improve. Pt is beginning to use notes for phone calls to compensate for her memory deficits affecting her ability to recall. She cont to notice her severely decr'd voice volume, pt was provided ENST device today and will be provided IMST device next session. SLP may formally assess pt's langauge skills PRN at a later date. Pt also reports coughing with meds is greatly reduced given  SLP suggestion but pt states coughing with liquids at least QD. SLP saw pt's MD has ordered modified. Pt would cont to benefit from skilled ST targeting memory enhancement, dysarthria, possible dysphagia and possible aphasia.    Speech Therapy  Frequency 2x / week    Duration --   8 weeks or 17 total sessions   Treatment/Interventions Aspiration precaution training;Pharyngeal strengthening exercises;Diet toleration management by SLP;Language facilitation;Internal/external aids;Cueing hierarchy;Trials of upgraded texture/liquids;Environmental controls;Oral motor exercises;Functional tasks;SLP instruction and feedback;Patient/family education    Potential to Achieve Goals Good    SLP Home Exercise Plan provided today for "hey" and blowing    Consulted and Agree with Plan of Care Patient           Patient will benefit from skilled therapeutic intervention in order to improve the following deficits and impairments:   Dysarthria and anarthria  Dysphagia, unspecified type  Cognitive communication deficit  Aphasia    Problem List Patient Active Problem List   Diagnosis Date Noted  . RLS (restless legs syndrome) 11/13/2019  . Prediabetes 11/13/2019  . Reactive depression 11/13/2019  . Long term prescription benzodiazepine use 11/13/2019  . Age-related osteoporosis with current pathological fracture 11/13/2019  . Bilateral primary osteoarthritis of knee 11/13/2019  . Acute exacerbation of CHF (congestive heart failure) (HCC) 10/15/2019  . PAF (paroxysmal atrial fibrillation) (HCC) 10/15/2019  . Essential hypertension 10/15/2019  . History of ischemic stroke 10/15/2019  . Acute CHF (congestive heart failure) (HCC) 10/15/2019    Kiahna Banghart ,MS, CCC-SLP  12/15/2019, 12:44 PM  [facility] [address] [place], [place], [address] Phone: [phone]   Fax:  [phone]   Name: Priyana G [name] MRN: [id number] Date of Birth: [birth year]

## 2019-12-15 NOTE — Patient Instructions (Signed)
    You have been issued an Expiratory Muscle Trainer to increase lung strength for improved speech abilities.   Please follow the instructions below provided by your Speech-Language Pathologist to complete your exercises.     Expiratory Muscle Training  Blue Device-"Blue equals blow"  . Perform __25__  Repetitions (5 sets of 5), ___2____ times per day. . Device set at:  ___13_____ cm H2O    Take a break or discontinue use of you experience lightheadedness, dizziness, pain, shortness of breath, color change, or excessive sweating. Any questions, call Baldo Ash or Vernona Rieger at [phone]

## 2019-12-16 ENCOUNTER — Ambulatory Visit (HOSPITAL_COMMUNITY)
Admission: RE | Admit: 2019-12-16 | Discharge: 2019-12-16 | Disposition: A | Discharge status: Home / Self Care | Payer: Medicare Other | Source: Ambulatory Visit | Attending: Family Medicine | Admitting: Family Medicine

## 2019-12-16 ENCOUNTER — Encounter: Payer: Self-pay | Admitting: Cardiovascular Disease

## 2019-12-16 ENCOUNTER — Ambulatory Visit (INDEPENDENT_AMBULATORY_CARE_PROVIDER_SITE_OTHER): Payer: Medicare Other | Admitting: Cardiovascular Disease

## 2019-12-16 DIAGNOSIS — I350 Nonrheumatic aortic (valve) stenosis: Secondary | ICD-10-CM | POA: Diagnosis not present

## 2019-12-16 DIAGNOSIS — R05 Cough: Secondary | ICD-10-CM | POA: Diagnosis present

## 2019-12-16 DIAGNOSIS — R1310 Dysphagia, unspecified: Secondary | ICD-10-CM | POA: Insufficient documentation

## 2019-12-16 DIAGNOSIS — Z8673 Personal history of transient ischemic attack (TIA), and cerebral infarction without residual deficits: Secondary | ICD-10-CM

## 2019-12-16 DIAGNOSIS — R059 Cough, unspecified: Secondary | ICD-10-CM

## 2019-12-16 NOTE — Therapy (Signed)
Modified Barium Swallow Progress Note  Patient Details  Name: Kaitlyn [name] MRN: [id number] Date of Birth: [birth year]  Today's Date: 12/16/2019  Modified Barium Swallow completed.  Full report located under Chart Review in the Imaging Section.  Brief recommendations include the following:  Clinical Impression  Ms. [name] demonstrates oropharyngeal swallow function WNL with no penetration, aspiration, or significant residue seen. She presents with her husband today to aid as historian with c/o coughing with PO intake, which has worsened since her stroke in January. She has PMH for CHF and GERD with many of her complaints appearing to be related to these diagnoses. For example, pt reports crackled breath quality, which has previously been diagnosed as being related to her CHF. She had no deficits visualized in oral or pharyngeal phases, but noted tight cricopharyngeus muscle, not obstructing bolus clearance. Esophageal phase was + for deficits including stacking of solid/pureed boluses up to the hypopharynx, requiring extended time and liquid wash to clear. Suspect esophageal deficits as cause for cough with retrograde bolus flow occurring in conjunction with cough. Pt/husband were fully educated on results and recommendations including thin liquids, regular solids, alternate solids/liquids, small meals, slow rate of PO intake, upright at 90 degrees for all PO intake, remain upright 30 mins after PO intake, follow up with GI regarding esophageal deficits   Swallow Evaluation Recommendations   Recommended Consults: Consider GI evaluation;Consider esophageal assessment   SLP Diet Recommendations: Regular solids;Thin liquid   Liquid Administration via: Cup;Straw   Medication Administration: Whole meds with liquid   Supervision: Patient able to self feed   Compensations: Slow rate;Small sips/bites;Follow solids with liquid   Postural Changes: Remain semi-upright after after feeds/meals  (Comment);Seated upright at 90 degrees   Oral Care Recommendations: Oral care BID      Jerman Tinnon P. Thurley Francesconi, M.S., CCC-SLP Speech-Language Pathologist Acute Rehabilitation Services Pager: [phone]   Branden Shallenberger P Jonique Kulig 12/16/2019,3:37 PM

## 2019-12-16 NOTE — Patient Instructions (Signed)

## 2019-12-18 ENCOUNTER — Other Ambulatory Visit: Payer: Self-pay

## 2019-12-18 ENCOUNTER — Ambulatory Visit: Payer: Medicare Other | Admitting: Speech Pathology

## 2019-12-18 DIAGNOSIS — R471 Dysarthria and anarthria: Secondary | ICD-10-CM

## 2019-12-18 NOTE — Patient Instructions (Addendum)
If you have questions about what types of foods you should be eating (like about salt content, etc), these would be best answered by your primary care doctor or perhaps a nutritionist. You may want to ask you primary care for a referral for a nutritionist.  Your swallow test showed normal swallowing for the oropharynx (mouth and throat). The therapist felt that the muscle that opens your esophagus appeared to be tight. She thought you might benefit from seeing a GI (gastroenterologist). Ask you primary care doctor about this as well.  Keep practicing breathing with the cup. Loud "Hey!" Loud reading (worksheets) Bring your breathing device with you next time.

## 2019-12-19 NOTE — Therapy (Signed)
[facility] [address] [place], [place], [address] Phone: [phone]   Fax:  [phone]  Speech Language Pathology Treatment  Patient Details  Name: Kaitlyn [name] MRN: [id number] Date of Birth: 10/18/1940 Referring Provider (SLP): Koren Shiver, MD   Encounter Date: 12/18/2019   End of Session - 12/19/19 1727    Visit Number 6    Number of Visits 17    Date for SLP Re-Evaluation 02/26/20    SLP Start Time 1402    SLP Stop Time  1445    SLP Time Calculation (min) 43 min    Activity Tolerance Patient tolerated treatment well           Past Medical History:  Diagnosis Date  . A-fib (HCC)   . Arthritis    both knees  . Chronic pain   . Hypertension   . Presence of permanent cardiac pacemaker   . Stroke [facility])     Past Surgical History:  Procedure Laterality Date  . CERVICAL SPINE SURGERY    . PACEMAKER IMPLANT    . PPM GENERATOR CHANGEOUT N/A 10/17/2019   Procedure: PPM GENERATOR CHANGEOUT;  Surgeon: Duke Salvia, MD;  Location: [facility] INVASIVE CV LAB;  Service: Cardiovascular;  Laterality: N/A;  . shoulder replacement Left     There were no vitals filed for this visit.   Subjective Assessment - 12/18/19 1404    Subjective Pt could not recall name of her friend who brought her to therapy.    Patient is accompained by: --   [place] - friend   Currently in Pain? No/denies                 ADULT SLP TREATMENT - 12/19/19 0001      General Information   Behavior/Cognition Alert;Cooperative;Pleasant mood      Treatment Provided   Treatment provided Cognitive-Linquistic      Pain Assessment   Pain Assessment No/denies pain      Cognitive-Linquistic Treatment   Treatment focused on Dysarthria    Skilled Treatment Patient had Modified Barium Swallow on 12/16/19 which found normal oropharyngeal function. GI was recommended due to tight cricopharyngus, stacking of boluses, retrograde bolus flow.  SLP educated pt and friend re: SLP recommendations. Pt did not bring EMT device today. Pt's MIP was measured last session at 16 cm H20; provided IMT device however patient was unable to follow commands sufficiently for appropriate technique, even at lowest setting (9 cm H20). Used nasal clamps to prevent inhalation through the nose, however pt continued with long, slow inhalation despite max cues. She also had difficulty coordinating inspiratory vs expiratory effort ~50% of the time. SLP feels that given cognitive deficits, pt likely to have more success with focus on one device (EMT) at this time. Requested pt bring this to next session. Pt's friend reported she usually leaves exercises with pt's spouse to work on at home. SLP suggested in session pt may wish to record a video on her phone so that spouse can assist her with EMT.       Assessment / Recommendations / Plan   Plan Continue with current plan of care      Progression Toward Goals   Progression toward goals Not progressing toward goals (comment)   cognition limits ability to follow complex commands             SLP Short Term Goals - 12/18/19 1428      SLP SHORT TERM GOAL #1  Title pt will undergo aphasia assessment PRN    Time 2    Period Weeks   or 9 sessions total, for all STGS   Status On-going      SLP SHORT TERM GOAL #2   Title pt will produce "hey" or loud /a/ with average upper 80s dB in 3 sessions    Baseline 12-04-19    Time 2    Period Weeks    Status On-going      SLP SHORT TERM GOAL #3   Title pt will engage in abdominal breathing 80% of the time at rest for 5 minutes x3 sessions    Time 2    Period Weeks    Status On-going      SLP SHORT TERM GOAL #4   Title pt will perform HEP for vocal tract strengthening with rare min A x3 sessions    Time 2    Period Weeks    Status On-going      SLP SHORT TERM GOAL #5   Title pt will utilize memory compensation/s when appropriate at home, between 3 sessions     Baseline 12-15-19    Time 2    Period Weeks    Status On-going            SLP Long Term Goals - 12/19/19 1729      SLP LONG TERM GOAL #1   Title pt will demo volume in upper 60s dB in 10 minutes simple-mod complex conversation    Time 6    Period Weeks   or 17 total sessions, for all LTGs   Status On-going      SLP LONG TERM GOAL #2   Title pt will demo abdominal breathing 75% of the time in a 10 minutes conversation with rare min A for procedure    Time 6    Period Weeks    Status On-going      SLP LONG TERM GOAL #3   Title pt will demo understanding importance of swallow precautions with meds x3 sessions    Time 6    Period Weeks    Status On-going      SLP LONG TERM GOAL #4   Title pt will report better tracking of visitors and of conversations using memory compensations than in mid-July, during pt's last 2-3 visits    Time 6    Period Weeks    Status On-going      SLP LONG TERM GOAL #5   Title pt will perform repiratory muscle training tasks with modified independence in 3 sessions    Time 6    Period Weeks    Status New      SLP LONG TERM GOAL #6   Title pt's max expiratory pressure will be >36 dm H2O    Time 6    Period Weeks    Status New            Plan - 12/19/19 1727    Clinical Impression Statement Pt presents today with 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 Voice strength continues to improve. Pt is beginning to use notes for phone calls to compensate for her memory deficits affecting her ability to recall. She cont to notice her severely decr'd voice volume, pt was provided ENST device today and will be provided IMST device next session. SLP may formally assess pt's langauge skills PRN at a later date. MBS showed normal pharyngeal function, however tight cricopharyngus, bolus stacking  and retrograde bolus flow for which GI consult was advised. Pt would cont to benefit from skilled ST targeting memory enhancement,  dysarthria, and possible aphasia.    Speech Therapy Frequency 2x / week    Duration --   8 weeks or 17 total sessions   Treatment/Interventions Aspiration precaution training;Pharyngeal strengthening exercises;Diet toleration management by SLP;Language facilitation;Internal/external aids;Cueing hierarchy;Trials of upgraded texture/liquids;Environmental controls;Oral motor exercises;Functional tasks;SLP instruction and feedback;Patient/family education    Potential to Achieve Goals Good    SLP Home Exercise Plan provided today for "hey" and blowing    Consulted and Agree with Plan of Care Patient           Patient will benefit from skilled therapeutic intervention in order to improve the following deficits and impairments:   Dysarthria and anarthria    Problem List Patient Active Problem List   Diagnosis Date Noted  . Aortic stenosis 12/16/2019  . RLS (restless legs syndrome) 11/13/2019  . Prediabetes 11/13/2019  . Reactive depression 11/13/2019  . Long term prescription benzodiazepine use 11/13/2019  . Age-related osteoporosis with current pathological fracture 11/13/2019  . Bilateral primary osteoarthritis of knee 11/13/2019  . Acute exacerbation of CHF (congestive heart failure) (HCC) 10/15/2019  . PAF (paroxysmal atrial fibrillation) (HCC) 10/15/2019  . Essential hypertension 10/15/2019  . History of ischemic stroke 10/15/2019  . Acute CHF (congestive heart failure) (HCC) 10/15/2019   Rondel Baton, MS, CCC-SLP Speech-Language Pathologist  Arlana Lindau 12/19/2019, 5:29 PM  [facility] [address] [place], [place], [address] Phone: [phone]   Fax:  [phone]   Name: Kaitlyn [name] MRN: [id number] Date of Birth: Apr 13, 1941

## 2019-12-23 ENCOUNTER — Ambulatory Visit: Payer: Medicare Other

## 2019-12-24 ENCOUNTER — Other Ambulatory Visit: Payer: Self-pay | Admitting: Family Medicine

## 2019-12-24 NOTE — Telephone Encounter (Signed)
Requested medication (s) are due for refill today: yes  Requested medication (s) are on the active medication list: yes  Last refill:  11/20/2019  Future visit scheduled: yes  Notes to clinic:  medication was lasted filled by a historical provider  Review for refill   Requested Prescriptions  Pending Prescriptions Disp Refills   citalopram (CELEXA) 10 MG tablet [Pharmacy Med Name: CITALOPRAM 10MG  TABLETS] 30 tablet     Sig: TAKE 1 TABLET(10 MG) BY MOUTH DAILY      Psychiatry:  Antidepressants - SSRI Passed - 12/24/2019 10:50 AM      Passed - Completed PHQ-2 or PHQ-9 in the last 360 days.      Passed - Valid encounter within last 6 months    Recent Outpatient Visits           3 weeks ago Wheezing   Primary Care at 12/26/2019, Oneita Jolly, MD   1 month ago History of ischemic stroke   Primary Care at [name], Oneita Jolly, MD       Future Appointments             In 2 days [name], MD Primary Care at [place], [facility]

## 2019-12-25 ENCOUNTER — Ambulatory Visit: Payer: Medicare Other | Admitting: Physical Therapy

## 2019-12-25 ENCOUNTER — Ambulatory Visit: Payer: Medicare Other | Admitting: Speech Pathology

## 2019-12-25 ENCOUNTER — Telehealth: Payer: Self-pay | Admitting: Physical Therapy

## 2019-12-25 NOTE — Telephone Encounter (Signed)
Called patient regarding missed therapy visits today (PT and ST).  Husband answered and stated that Mrs. [place] "just couldn't do it" and he wasn't sure if they were going to come back to therapy.  Husband stated that pt continues to be very weak, is not eating a lot and is not sleeping well at night.  Husband feels patient is not getting any stronger.    Asked if pt would be receiving home health therapy; husband reports they have already participated in [facility] and they discharged her and said she was doing well.  Husband is concerned because if pt does not get stronger she won't be able to have the necessary valve surgery.  Husband reports they go see her PCP tomorrow.  Requested that husband discuss his concerns about lack of improvement in patient's condition with PCP to see if there is a medical reason.  Also requested that husband discuss with PCP her recommendation for continuing or discontinuing therapy.  Requested that husband contact this clinic to let therapists know final decision.    Husband verbalized understanding and agreement.  Will follow up with patient and husband after appointment with PCP tomorrow.  Dierdre Highman, PT, DPT 12/25/19    11:35 AM

## 2019-12-25 NOTE — Telephone Encounter (Signed)
No further refills without office visit 

## 2019-12-26 ENCOUNTER — Ambulatory Visit (INDEPENDENT_AMBULATORY_CARE_PROVIDER_SITE_OTHER): Payer: Medicare Other | Admitting: Family Medicine

## 2019-12-26 ENCOUNTER — Other Ambulatory Visit: Payer: Self-pay

## 2019-12-26 ENCOUNTER — Encounter: Payer: Self-pay | Admitting: Family Medicine

## 2019-12-26 VITALS — BP 124/95 | HR 66 | Temp 97.4°F | Resp 18 | Ht 61.0 in | Wt 150.8 lb

## 2019-12-26 DIAGNOSIS — Z7901 Long term (current) use of anticoagulants: Secondary | ICD-10-CM

## 2019-12-26 DIAGNOSIS — Z8673 Personal history of transient ischemic attack (TIA), and cerebral infarction without residual deficits: Secondary | ICD-10-CM

## 2019-12-26 DIAGNOSIS — F329 Major depressive disorder, single episode, unspecified: Secondary | ICD-10-CM | POA: Diagnosis not present

## 2019-12-26 DIAGNOSIS — I5033 Acute on chronic diastolic (congestive) heart failure: Secondary | ICD-10-CM

## 2019-12-26 DIAGNOSIS — I350 Nonrheumatic aortic (valve) stenosis: Secondary | ICD-10-CM

## 2019-12-26 LAB — POCT CBC
Granulocyte percent: 82.8 %G — AB (ref 37–80)
HCT, POC: 43.7 % — AB (ref 29–41)
Hemoglobin: 14.3 g/dL (ref 11–14.6)
Lymph, poc: 1.3 (ref 0.6–3.4)
MCH, POC: 30.4 pg (ref 27–31.2)
MCHC: 32.8 g/dL (ref 31.8–35.4)
MCV: 92.8 fL (ref 76–111)
MID (cbc): 0.1 (ref 0–0.9)
MPV: 8.9 fL (ref 0–99.8)
POC Granulocyte: 6.9 (ref 2–6.9)
POC LYMPH PERCENT: 15.6 %L (ref 10–50)
POC MID %: 1.6 %M (ref 0–12)
Platelet Count, POC: 188 10*3/uL (ref 142–424)
RBC: 4.71 M/uL (ref 4.04–5.48)
RDW, POC: 23.6 %
WBC: 8.3 10*3/uL (ref 4.6–10.2)

## 2019-12-26 MED ORDER — CITALOPRAM HYDROBROMIDE 20 MG PO TABS: 20.0000 mg | ORAL_TABLET | Freq: Every day | ORAL | 5 refills | Status: DC

## 2019-12-26 MED ORDER — FUROSEMIDE 20 MG PO TABS: ORAL_TABLET | ORAL | 1 refills | Status: DC

## 2019-12-26 NOTE — Progress Notes (Signed)
7/30/20213:26 PM  Kaitlyn [name] 03/23/1941, 79 y.o., female [id number]  Chief Complaint  Patient presents with  . Follow-up    Patient is here for a follow up for wheezing she is still wheezing bad at night and she seems to get hot when she is usually the opposite. Patient husband said she is not sleeping any at night and seems to be more angry and confused, and then she will have 2 or 3 days out the week when she is happy.    HPI:   Patient is a 79 y.o. 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, RLS, HTN, osteoporosis, prediabetes, insomnia, depression, primary OA both knees who presents today for routine followup  Last OV a month ago - referred to speech for swallow, trial of PPI and albuterol, increased lasix to BID x 3 days, referred to neuro rehab  Normal speech swallow with suspected esophageal deficits as cause of cough, no diet restrictions given, reflux precautions give  Saw cards last week for eval of AS - mod/sev, no surgical intervention due to current health  Had to quit rehab due to being too exhausted  Last night had very hard night, very short of breath, woke up confused, very hot, currently breathing ok this happened twice this past month, refused ambulance  Was very thristy all day yesterday - drank lots of water Difficulty with low salt diet - they are not cooking, eat out  Having increased altered behavior, angry/irritable Reports feeling more depressed as not able to take care of things like before  Having difficulty with managing pills, needs pills pak as her husband also cant see very well due to cataracts   No chest pain, palpitations, focal weakness Feels that speech is slowly getting better Denies any abnormal bleeding since being on eliquis  Has not seen neuro of yet  Husband struggling with care - his wife managed entire household and finances   Depression screen  [facility] 2/9 12/26/2019 11/27/2019 11/13/2019  Decreased Interest 0 0 0  Down, Depressed, Hopeless 0 0 0  PHQ - 2 Score 0 0 0  Altered sleeping - - 2  Tired, decreased energy - - 1  Change in appetite - - 0  Feeling bad or failure about yourself  - - 0  Trouble concentrating - - 2  Moving slowly or fidgety/restless - - 0  Suicidal thoughts - - 0  PHQ-9 Score - - 5  Difficult doing work/chores - - Not difficult at all    Fall Risk  12/26/2019 11/27/2019 11/13/2019  Falls in the past year? 1 0 1  Number falls in past yr: 0 - 1  Injury with Fall? 1 - 0  Follow up Falls evaluation completed Falls evaluation completed Falls evaluation completed     Allergies  Allergen Reactions  . Lisinopril Cough         Prior to Admission medications   Medication Sig Start Date End Date Taking? Authorizing Provider  albuterol (VENTOLIN HFA) 108 (90 Base) MCG/ACT inhaler Inhale 2 puffs into the lungs every 6 (six) hours as needed for wheezing or shortness of breath. 11/27/19  Yes Myles Lipps, MD  apixaban (ELIQUIS) 5 MG TABS tablet Take 5 mg by mouth 2 (two) times daily.  07/21/19  Yes [provider]  citalopram (CELEXA) 10 MG tablet TAKE 1 TABLET(10 MG) BY MOUTH DAILY 12/25/19  Yes Myles Lipps, MD  clonazePAM (  KLONOPIN) 0.5 MG tablet Take 0.5mg  at bedtime M-Saturday, on Sundays take 0.25mg  11/13/19  Yes Myles Lipps, MD  esomeprazole (NEXIUM) 20 MG capsule Take 1 capsule (20 mg total) by mouth 2 (two) times daily before a meal. 11/27/19  Yes Myles Lipps, MD  famotidine (PEPCID) 10 MG tablet Take 1 tablet (10 mg total) by mouth daily. 10/21/19  Yes Regalado, Belkys A, MD  furosemide (LASIX) 20 MG tablet Take 1 tablet (20 mg total) by mouth daily. 11/05/19  Yes Sheilah Pigeon, PA-C  lactulose (CHRONULAC) 10 GM/15ML solution Take 30 mLs (20 g total) by mouth 2 (two) times daily. 10/20/19  Yes Regalado, Belkys A, MD  Magnesium 400 MG TABS Take 400 mg by mouth daily. 11/05/19  Yes Sheilah Pigeon, PA-C  metoprolol succinate (TOPROL-XL) 50 MG 24 hr tablet Take 50 mg by mouth daily. 07/21/19  Yes [provider]  potassium chloride SA (KLOR-CON) 20 MEQ tablet Take 1 tablet (20 mEq total) by mouth daily. 11/05/19  Yes Sheilah Pigeon, PA-C  propafenone (RYTHMOL) 150 MG tablet Take 150 mg by mouth daily in the afternoon.   Yes [provider]  rOPINIRole (REQUIP) 0.5 MG tablet Take 1 mg by mouth at bedtime.  11/05/18  Yes [provider]    Past Medical History:  Diagnosis Date  . A-fib (HCC)   . Arthritis    both knees  . Chronic pain   . Hypertension   . Presence of permanent cardiac pacemaker   . Stroke [facility])     Past Surgical History:  Procedure Laterality Date  . CERVICAL SPINE SURGERY    . PACEMAKER IMPLANT    . PPM GENERATOR CHANGEOUT N/A 10/17/2019   Procedure: PPM GENERATOR CHANGEOUT;  Surgeon: Duke Salvia, MD;  Location: [facility] INVASIVE CV LAB;  Service: Cardiovascular;  Laterality: N/A;  . shoulder replacement Left     Social History   Tobacco Use  . Smoking status: Current Every Day Smoker    Types: E-cigarettes  . Smokeless tobacco: Never Used  Substance Use Topics  . Alcohol use: Never    Family History  Problem Relation Age of Onset  . Stroke Father   . Alzheimer's disease Mother     ROS Per hpi  OBJECTIVE:  Today's Vitals   12/26/19 1501  BP: (!) 124/95  Pulse: 66  Resp: 18  Temp: (!) 97.4 F (36.3 C)  TempSrc: Temporal  SpO2: 94%  Weight: 150 lb 12.8 oz (68.4 kg)  Height: 5\' 1"  (1.549 m)   Body mass index is 28.49 kg/m.   Wt Readings from Last 3 Encounters:  12/26/19 150 lb 12.8 oz (68.4 kg)  12/16/19 148 lb 6.4 oz (67.3 kg)  11/27/19 150 lb (68 kg)     Physical Exam Vitals and nursing note reviewed.  Constitutional:      Appearance: She is well-developed.  HENT:     Head: Normocephalic and atraumatic.     Mouth/Throat:     Pharynx: No oropharyngeal exudate.  Eyes:     General: No scleral  icterus.    Extraocular Movements: Extraocular movements intact.     Conjunctiva/sclera: Conjunctivae normal.     Pupils: Pupils are equal, round, and reactive to light.  Cardiovascular:     Rate and Rhythm: Normal rate and regular rhythm.     Heart sounds: Normal heart sounds. No murmur heard.  No friction rub. No gallop.   Pulmonary:     Effort: Pulmonary  effort is normal.     Breath sounds: Rales (bibasilar) present. No wheezing or rhonchi.  Musculoskeletal:     Cervical back: Neck supple.     Right lower leg: Edema (+2 pitting edema up 2/3 of BLE) present.     Left lower leg: Edema present.  Skin:    General: Skin is warm and dry.  Neurological:     Mental Status: She is alert and oriented to person, place, and time. Mental status is at baseline.     Results for orders placed or performed in visit on 12/26/19 (from the past 24 hour(s))  POCT CBC     Status: Abnormal   Collection Time: 12/26/19  4:09 PM  Result Value Ref Range   WBC 8.3 4.6 - 10.2 K/uL   Lymph, poc 1.3 0.6 - 3.4   POC LYMPH PERCENT 15.6 10 - 50 %L   MID (cbc) 0.1 0 - 0.9   POC MID % 1.6 0 - 12 %M   POC Granulocyte 6.9 2 - 6.9   Granulocyte percent 82.8 (A) 37 - 80 %G   RBC 4.71 4.04 - 5.48 M/uL   Hemoglobin 14.3 11 - 14.6 g/dL   HCT, POC 88.8 (A) 29 - 41 %   MCV 92.8 76 - 111 fL   MCH, POC 30.4 27 - 31.2 pg   MCHC 32.8 31.8 - 35.4 g/dL   RDW, POC 28.0 %   Platelet Count, POC 188 142 - 424 K/uL   MPV 8.9 0 - 99.8 fL    No results found.   ASSESSMENT and PLAN  1. Acute on chronic diastolic congestive heart failure (HCC) Signs of fluid overload today in setting of dietary non compliance. Reviewed importance of decreased salt and fluid intake and daily weights. Mild increase in diuretics, adding 10mg  in afternoon for 3 days. Mindful of AS. followup on Monday. Sees cards in 1 month. Might need to see sooner. - Comprehensive metabolic panel - furosemide (LASIX) 20 MG tablet; Take 1 tablet (20 mg  total) by mouth in the morning for 3 days, THEN 0.5 tablets (10 mg total) daily after lunch for 3 days.  2. Long term current use of anticoagulant therapy - POCT CBC - no anemia  3. Reactive depression Not controlled. Patient unfortunately had to stop rehab which would have helped with her recovery. Discussed cont doing learned exercises at home. Increase celexa to 20mg  daily. - citalopram (CELEXA) 20 MG tablet; Take 1 tablet (20 mg total) by mouth daily.  4. History of ischemic stroke Advised patient to schedule appt with neurology.   5. Nonrheumatic aortic valve stenosis Managed by cards  Medications (except lactulose and propafenone) were sent to [facility] pharmacy with instructions for pill pak.  Need to gets cards to send propafenone.  Return in 3 days (on 12/29/2019).    , MD Primary Care at [facility] [address] [place], [address] [place] Ph.  [phone] Fax [phone]

## 2019-12-26 NOTE — Patient Instructions (Addendum)
Increase celexa (citalopram) to 20mg  once a day for depression and irritability  Need to monitor salt and fluid intake as that will lead to fluid retention and worsening breathing  Need to monitor daily weight  Increase lasix (furosemide) - water pill/diuretic: Take 20mg  (1 tablet) every morning Take 10mg  (1/2 tablet) every afternoon  followup on monday    If you have lab work done today you will be contacted with your lab results within the next 2 weeks.  If you have not heard from then please contact . The fastest way to get your results is to register for My Chart.   IF you received an x-ray today, you will receive an invoice from [facility]. Please contact [facility] at [phone] with questions or concerns regarding your invoice.   IF you received labwork today, you will receive an invoice from [facility]. Please contact [facility] at [phone] with questions or concerns regarding your invoice.   Our billing staff will not be able to assist you with questions regarding bills from these companies.  You will be contacted with the lab results as soon as they are available. The fastest way to get your results is to activate your My Chart account. Instructions are located on the last page of this paperwork. If you have not heard from [phone] regarding the results in 2 weeks, please contact this office.

## 2019-12-27 DIAGNOSIS — Z7982 Long term (current) use of aspirin: Secondary | ICD-10-CM

## 2019-12-27 HISTORY — DX: Long term (current) use of aspirin: Z79.82

## 2019-12-27 LAB — COMPREHENSIVE METABOLIC PANEL
ALT: 402 IU/L — ABNORMAL HIGH (ref 0–32)
AST: 282 IU/L — ABNORMAL HIGH (ref 0–40)
Albumin/Globulin Ratio: 1.6 (ref 1.2–2.2)
Albumin: 3.9 g/dL (ref 3.7–4.7)
Alkaline Phosphatase: 113 IU/L (ref 48–121)
BUN/Creatinine Ratio: 31 — ABNORMAL HIGH (ref 12–28)
BUN: 30 mg/dL — ABNORMAL HIGH (ref 8–27)
Bilirubin Total: 2.7 mg/dL — ABNORMAL HIGH (ref 0.0–1.2)
CO2: 21 mmol/L (ref 20–29)
Calcium: 9.7 mg/dL (ref 8.7–10.3)
Chloride: 103 mmol/L (ref 96–106)
Creatinine, Ser: 0.97 mg/dL (ref 0.57–1.00)
GFR calc Af Amer: 65 mL/min/{1.73_m2} (ref >59)
GFR calc non Af Amer: 57 mL/min/{1.73_m2} — ABNORMAL LOW (ref >59)
Globulin, Total: 2.4 g/dL (ref 1.5–4.5)
Glucose: 156 mg/dL — ABNORMAL HIGH (ref 65–99)
Potassium: 4.3 mmol/L (ref 3.5–5.2)
Sodium: 140 mmol/L (ref 134–144)
Total Protein: 6.3 g/dL (ref 6.0–8.5)

## 2019-12-28 ENCOUNTER — Encounter: Payer: Self-pay | Admitting: Family Medicine

## 2019-12-28 MED ORDER — CITALOPRAM HYDROBROMIDE 20 MG PO TABS: 20.0000 mg | ORAL_TABLET | Freq: Every day | ORAL | 5 refills | Status: DC

## 2019-12-28 MED ORDER — FUROSEMIDE 20 MG PO TABS: 20.0000 mg | ORAL_TABLET | Freq: Every morning | ORAL | 5 refills | Status: DC

## 2019-12-28 MED ORDER — APIXABAN 5 MG PO TABS: 5.0000 mg | ORAL_TABLET | Freq: Two times a day (BID) | ORAL | 5 refills | Status: DC

## 2019-12-28 MED ORDER — POTASSIUM CHLORIDE CRYS ER 20 MEQ PO TBCR: 20.0000 meq | EXTENDED_RELEASE_TABLET | Freq: Every day | ORAL | 5 refills | Status: DC

## 2019-12-28 MED ORDER — ROPINIROLE HCL 1 MG PO TABS: 1.0000 mg | ORAL_TABLET | Freq: Every day | ORAL | 5 refills | Status: DC

## 2019-12-28 MED ORDER — ESOMEPRAZOLE MAGNESIUM 20 MG PO CPDR: 20.0000 mg | DELAYED_RELEASE_CAPSULE | Freq: Two times a day (BID) | ORAL | 5 refills | Status: DC

## 2019-12-28 MED ORDER — METOPROLOL SUCCINATE ER 50 MG PO TB24: 50.0000 mg | ORAL_TABLET | Freq: Every day | ORAL | 5 refills | Status: DC

## 2019-12-28 MED ORDER — MAGNESIUM 400 MG PO TABS: 400.0000 mg | ORAL_TABLET | Freq: Every day | ORAL | 5 refills | Status: DC

## 2019-12-29 ENCOUNTER — Ambulatory Visit (INDEPENDENT_AMBULATORY_CARE_PROVIDER_SITE_OTHER): Payer: Medicare Other | Admitting: Family Medicine

## 2019-12-29 ENCOUNTER — Encounter: Payer: Self-pay | Admitting: Family Medicine

## 2019-12-29 ENCOUNTER — Emergency Department (HOSPITAL_COMMUNITY)
Admission: EM | Admit: 2019-12-29 | Discharge: 2019-12-29 | Disposition: A | Discharge status: Home / Self Care | Payer: Medicare Other | Attending: Emergency Medicine | Admitting: Emergency Medicine

## 2019-12-29 ENCOUNTER — Emergency Department (HOSPITAL_COMMUNITY): Payer: Medicare Other

## 2019-12-29 ENCOUNTER — Encounter (HOSPITAL_COMMUNITY): Payer: Self-pay | Admitting: Emergency Medicine

## 2019-12-29 ENCOUNTER — Other Ambulatory Visit: Payer: Self-pay

## 2019-12-29 VITALS — BP 111/88 | HR 98 | Temp 97.0°F | Ht 61.0 in | Wt 150.0 lb

## 2019-12-29 DIAGNOSIS — R945 Abnormal results of liver function studies: Secondary | ICD-10-CM

## 2019-12-29 DIAGNOSIS — R2243 Localized swelling, mass and lump, lower limb, bilateral: Secondary | ICD-10-CM | POA: Insufficient documentation

## 2019-12-29 DIAGNOSIS — R61 Generalized hyperhidrosis: Secondary | ICD-10-CM | POA: Insufficient documentation

## 2019-12-29 DIAGNOSIS — R0601 Orthopnea: Secondary | ICD-10-CM | POA: Diagnosis not present

## 2019-12-29 DIAGNOSIS — R0602 Shortness of breath: Secondary | ICD-10-CM | POA: Insufficient documentation

## 2019-12-29 DIAGNOSIS — R4182 Altered mental status, unspecified: Secondary | ICD-10-CM

## 2019-12-29 DIAGNOSIS — F329 Major depressive disorder, single episode, unspecified: Secondary | ICD-10-CM

## 2019-12-29 LAB — BASIC METABOLIC PANEL
Anion gap: 10 (ref 5–15)
BUN: 26 mg/dL — ABNORMAL HIGH (ref 8–23)
CO2: 26 mmol/L (ref 22–32)
Calcium: 9.2 mg/dL (ref 8.9–10.3)
Chloride: 104 mmol/L (ref 98–111)
Creatinine, Ser: 0.9 mg/dL (ref 0.44–1.00)
GFR calc Af Amer: >60 mL/min (ref >60)
GFR calc non Af Amer: >60 mL/min (ref >60)
Glucose, Bld: 114 mg/dL — ABNORMAL HIGH (ref 70–99)
Potassium: 4 mmol/L (ref 3.5–5.1)
Sodium: 140 mmol/L (ref 135–145)

## 2019-12-29 LAB — CBC
HCT: 46.7 % — ABNORMAL HIGH (ref 36.0–46.0)
Hemoglobin: 14.1 g/dL (ref 12.0–15.0)
MCH: 28.4 pg (ref 26.0–34.0)
MCHC: 30.2 g/dL (ref 30.0–36.0)
MCV: 94.2 fL (ref 80.0–100.0)
Platelets: 207 10*3/uL (ref 150–400)
RBC: 4.96 MIL/uL (ref 3.87–5.11)
RDW: 21.9 % — ABNORMAL HIGH (ref 11.5–15.5)
WBC: 5.7 10*3/uL (ref 4.0–10.5)
nRBC: 0.4 % — ABNORMAL HIGH (ref 0.0–0.2)

## 2019-12-29 MED ORDER — CITALOPRAM HYDROBROMIDE 10 MG PO TABS: 10.0000 mg | ORAL_TABLET | Freq: Every day | ORAL | 5 refills | Status: DC

## 2019-12-29 NOTE — Patient Instructions (Addendum)
Patient being advised to ER for further eval and treatment Patient with worsening orthopnea and paroxysmal nocturnal dyspnea, elevation of liver enzymes and worsening confusion    If you have lab work done today you will be contacted with your lab results within the next 2 weeks.  If you have not heard from [place] then please contact us. The fastest way to get your results is to register for My Chart.   IF you received an x-ray today, you will receive an invoice from [facility]. Please contact [facility] at [phone] with questions or concerns regarding your invoice.   IF you received labwork today, you will receive an invoice from [place]. Please contact [facility] at [phone] with questions or concerns regarding your invoice.   Our billing staff will not be able to assist you with questions regarding bills from these companies.  You will be contacted with the lab results as soon as they are available. The fastest way to get your results is to activate your My Chart account. Instructions are located on the last page of this paperwork. If you have not heard from [place] regarding the results in 2 weeks, please contact this office.

## 2019-12-29 NOTE — Progress Notes (Signed)
8/2/20212:17 PM  Kaitlyn [name] [birth year], 79 y.o., female [id number]  Chief Complaint  Patient presents with  . Follow-up    heart failure    HPI:   Patient is a 79 y.o. 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, RLS, HTN, osteoporosis, prediabetes, insomnia, depression, primary OA both knees who presents today for routine followup  Last OV 3 days ago - added 10mg  lasix in afternoon, increased celexa to 20mg  daily   Husband very concerned "she is dying" She continues to have sob when she lies down, has been sleeping mostly upright, awakens feeling SOB Husband states that she is having periods of confusion She got very somnolent on higher dose of celexa  Has not been taking lactulose - husband states it was stopped in rehab once her ammonia normalized, denies any h/o chronic liver disease  CBC Latest Ref Rng & Units 12/26/2019 10/18/2019 10/17/2019  WBC 4.6 - 10.2 K/uL 8.3 6.8 8.6  Hemoglobin 11 - 14.6 g/dL 10/20/2019 10/19/2019 16.3  Hematocrit 29 - 41 % 43.7(A) 42.7 43.5  Platelets 150 - 400 K/uL - 256 269   Lab Results  Component Value Date   ALT 402 (H) 12/26/2019   AST 282 (H) 12/26/2019   ALKPHOS 113 12/26/2019   BILITOT 2.7 (H) 12/26/2019   Lab Results  Component Value Date   CREATININE 0.97 12/26/2019   BUN 30 (H) 12/26/2019   NA 140 12/26/2019   K 4.3 12/26/2019   CL 103 12/26/2019   CO2 21 12/26/2019    Depression screen PHQ 2/9 12/26/2019 11/27/2019 11/13/2019  Decreased Interest 0 0 0  Down, Depressed, Hopeless 0 0 0  PHQ - 2 Score 0 0 0  Altered sleeping - - 2  Tired, decreased energy - - 1  Change in appetite - - 0  Feeling bad or failure about yourself  - - 0  Trouble concentrating - - 2  Moving slowly or fidgety/restless - - 0  Suicidal thoughts - - 0  PHQ-9 Score - - 5  Difficult doing work/chores - - Not difficult at all    Fall Risk  12/29/2019 12/26/2019 11/27/2019 11/13/2019   Falls in the past year? 0 1 0 1  Number falls in past yr: 0 0 - 1  Injury with Fall? 0 1 - 0  Follow up Falls evaluation completed Falls evaluation completed Falls evaluation completed Falls evaluation completed     Allergies  Allergen Reactions  . Lisinopril Cough         Prior to Admission medications   Medication Sig Start Date End Date Taking? Authorizing Provider  albuterol (VENTOLIN HFA) 108 (90 Base) MCG/ACT inhaler Inhale 2 puffs into the lungs every 6 (six) hours as needed for wheezing or shortness of breath. 11/27/19  Yes 11/15/2019, MD  apixaban (ELIQUIS) 5 MG TABS tablet Take 1 tablet (5 mg total) by mouth 2 (two) times daily. 12/28/19  Yes Myles Lipps, MD  citalopram (CELEXA) 20 MG tablet Take 1 tablet (20 mg total) by mouth daily. 12/28/19  Yes Myles Lipps, MD  clonazePAM 02/27/20) 0.5 MG tablet Take 0.5mg  at bedtime M-Saturday, on Sundays take 0.25mg  11/13/19  Yes 09-10-1971, MD  esomeprazole (NEXIUM) 20 MG capsule Take 1 capsule (20 mg total) by mouth 2 (two) times daily before a meal. 12/28/19  Yes Myles Lipps, MD  furosemide (LASIX) 20 MG tablet Take 1  tablet (20 mg total) by mouth in the morning. 12/28/19  Yes Myles Lipps, MD  lactulose (CHRONULAC) 10 GM/15ML solution Take 30 mLs (20 g total) by mouth 2 (two) times daily. 10/20/19  Yes Regalado, Belkys A, MD  Magnesium 400 MG TABS Take 400 mg by mouth daily. 12/28/19  Yes Myles Lipps, MD  metoprolol succinate (TOPROL-XL) 50 MG 24 hr tablet Take 1 tablet (50 mg total) by mouth daily. 12/28/19  Yes Myles Lipps, MD  potassium chloride SA (KLOR-CON) 20 MEQ tablet Take 1 tablet (20 mEq total) by mouth daily. 12/28/19  Yes Myles Lipps, MD  propafenone (RYTHMOL) 150 MG tablet Take 150 mg by mouth daily in the afternoon.   Yes [provider]  rOPINIRole (REQUIP) 1 MG tablet Take 1 tablet (1 mg total) by mouth at bedtime. 12/28/19  Yes Myles Lipps, MD    Past Medical History:   Diagnosis Date  . A-fib (HCC)   . Arthritis    both knees  . Chronic pain   . Hypertension   . Presence of permanent cardiac pacemaker   . Stroke [facility])     Past Surgical History:  Procedure Laterality Date  . CERVICAL SPINE SURGERY    . PACEMAKER IMPLANT    . PPM GENERATOR CHANGEOUT N/A 10/17/2019   Procedure: PPM GENERATOR CHANGEOUT;  Surgeon: Duke Salvia, MD;  Location: [facility] INVASIVE CV LAB;  Service: Cardiovascular;  Laterality: N/A;  . shoulder replacement Left     Social History   Tobacco Use  . Smoking status: Current Every Day Smoker    Types: E-cigarettes  . Smokeless tobacco: Never Used  Substance Use Topics  . Alcohol use: Never    Family History  Problem Relation Age of Onset  . Stroke Father   . Alzheimer's disease Mother     ROS Per hpi  OBJECTIVE:  Today's Vitals   12/29/19 1406  BP: 111/88  Pulse: 98  Temp: (!) 97 F (36.1 C)  SpO2: 96%  Weight: 150 lb (68 kg)  Height: 5\' 1"  (1.549 m)   Body mass index is 28.34 kg/m.  Wt Readings from Last 3 Encounters:  12/29/19 150 lb (68 kg)  12/26/19 150 lb 12.8 oz (68.4 kg)  12/16/19 148 lb 6.4 oz (67.3 kg)    Physical Exam Vitals and nursing note reviewed.  Constitutional:      Appearance: She is well-developed.     Comments: In wheelchair  HENT:     Head: Normocephalic and atraumatic.     Mouth/Throat:     Pharynx: No oropharyngeal exudate.  Eyes:     General: No scleral icterus.    Extraocular Movements: Extraocular movements intact.     Conjunctiva/sclera: Conjunctivae normal.     Pupils: Pupils are equal, round, and reactive to light.  Cardiovascular:     Rate and Rhythm: Normal rate and regular rhythm.     Heart sounds: Normal heart sounds. No murmur heard.  No friction rub. No gallop.   Pulmonary:     Effort: Pulmonary effort is normal.     Breath sounds: Normal breath sounds. No wheezing, rhonchi or rales.  Abdominal:     General: Bowel sounds are normal. There is no  distension.     Palpations: Abdomen is soft.     Tenderness: There is no abdominal tenderness.  Musculoskeletal:     Cervical back: Neck supple.     Right lower leg: Edema (+ 1 pitting edema) present.  Left lower leg: Edema present.  Skin:    General: Skin is warm and dry.  Neurological:     Mental Status: She is alert and oriented to person, place, and time.     No results found for this or any previous visit (from the past 24 hour(s)).  No results found.   ASSESSMENT and PLAN  1. Orthopnea 2. Abnormal liver function 3. Altered mental status, unspecified altered mental status type Patient worsening orthopnea with new elevation in LFTs. Advised ER evaluation.   4. Reactive depression - citalopram (CELEXA) 10 MG tablet; Take 1 tablet (10 mg total) by mouth daily.  No follow-ups on file.    Myles Lipps, MD Primary Care at [facility] [address] [place], [place] [address] Ph.  [phone] Fax [phone]

## 2019-12-29 NOTE — ED Notes (Signed)
Pt's husband up to desk to ask about wait times. Husband informed of longest wait, explained pt might not have to wait that long. Pt's husband stated that they would not be staying. Seen wheeling pt out of the ED.

## 2019-12-29 NOTE — ED Triage Notes (Signed)
Patient arrives to ED with complaints of increased shortness of breath, leg swelling, and generalized body sweating over the past week. Pt states she lays flat and cannot breath. Hx of CHF. Denies CP but states depression from stroke. Just started on new anti depressant.

## 2019-12-30 ENCOUNTER — Ambulatory Visit: Payer: Medicare Other | Attending: Family Medicine

## 2020-01-01 ENCOUNTER — Ambulatory Visit: Payer: Medicare Other | Admitting: Speech Pathology

## 2020-01-01 ENCOUNTER — Ambulatory Visit: Payer: Medicare Other | Admitting: Physical Therapy

## 2020-01-01 ENCOUNTER — Encounter: Payer: Self-pay | Admitting: Family Medicine

## 2020-01-01 ENCOUNTER — Telehealth: Payer: Self-pay | Admitting: Speech Pathology

## 2020-01-01 NOTE — Telephone Encounter (Signed)
Attempted to contact pt's husband after second no-show for ST (and presumably PT appointment at 11:00 am today). He was contacted by PT after previous no-show on 7/27. No answer, unable to leave message due to voicemail being full. Will continue efforts. If unable to reach patient, future appointments may be cancelled per no-show policy.   Rondel Baton, MS, Sports administrator

## 2020-01-06 ENCOUNTER — Ambulatory Visit: Payer: Medicare Other | Admitting: Physical Therapy

## 2020-01-08 ENCOUNTER — Encounter: Payer: Medicare Other | Admitting: Speech Pathology

## 2020-01-08 ENCOUNTER — Ambulatory Visit: Payer: Medicare Other | Admitting: Physical Therapy

## 2020-01-13 ENCOUNTER — Ambulatory Visit: Payer: Medicare Other | Admitting: Physical Therapy

## 2020-01-15 ENCOUNTER — Ambulatory Visit: Payer: Medicare Other | Admitting: Physical Therapy

## 2020-01-15 ENCOUNTER — Encounter: Payer: Medicare Other | Admitting: Speech Pathology

## 2020-01-16 ENCOUNTER — Ambulatory Visit (INDEPENDENT_AMBULATORY_CARE_PROVIDER_SITE_OTHER): Payer: Medicare Other | Admitting: *Deleted

## 2020-01-16 DIAGNOSIS — I495 Sick sinus syndrome: Secondary | ICD-10-CM

## 2020-01-16 LAB — CUP PACEART REMOTE DEVICE CHECK
Battery Remaining Longevity: 172 mo
Battery Voltage: 3.2 V
Brady Statistic AP VP Percent: 0.42 %
Brady Statistic AP VS Percent: 83.49 %
Brady Statistic AS VP Percent: 1.02 %
Brady Statistic AS VS Percent: 15.4 %
Brady Statistic RA Percent Paced: 10.61 %
Brady Statistic RV Percent Paced: 4.16 %
Date Time Interrogation Session: 20210819232719
Implantable Lead Implant Date: 20091201
Implantable Lead Implant Date: 20091209
Implantable Lead Location: 753859
Implantable Lead Location: 753860
Implantable Lead Model: 4076
Implantable Lead Model: 4076
Implantable Pulse Generator Implant Date: 20210521
Lead Channel Impedance Value: 304 Ohm
Lead Channel Impedance Value: 380 Ohm
Lead Channel Impedance Value: 475 Ohm
Lead Channel Impedance Value: 570 Ohm
Lead Channel Pacing Threshold Amplitude: 0.625 V
Lead Channel Pacing Threshold Amplitude: 0.625 V
Lead Channel Pacing Threshold Pulse Width: 0.4 ms
Lead Channel Pacing Threshold Pulse Width: 0.4 ms
Lead Channel Sensing Intrinsic Amplitude: 15.875 mV
Lead Channel Sensing Intrinsic Amplitude: 15.875 mV
Lead Channel Sensing Intrinsic Amplitude: 2.5 mV
Lead Channel Sensing Intrinsic Amplitude: 2.5 mV
Lead Channel Setting Pacing Amplitude: 1.5 V
Lead Channel Setting Pacing Amplitude: 2.5 V
Lead Channel Setting Pacing Pulse Width: 0.4 ms
Lead Channel Setting Sensing Sensitivity: 5.6 mV

## 2020-01-19 DIAGNOSIS — Z950 Presence of cardiac pacemaker: Secondary | ICD-10-CM

## 2020-01-19 DIAGNOSIS — I495 Sick sinus syndrome: Secondary | ICD-10-CM | POA: Insufficient documentation

## 2020-01-19 HISTORY — DX: Presence of cardiac pacemaker: Z95.0

## 2020-01-19 NOTE — Progress Notes (Signed)
Remote pacemaker transmission.   

## 2020-01-20 ENCOUNTER — Ambulatory Visit: Payer: Medicare Other | Admitting: Physical Therapy

## 2020-01-21 ENCOUNTER — Encounter: Payer: Medicare Other | Admitting: Internal Medicine

## 2020-01-21 ENCOUNTER — Telehealth: Payer: Self-pay | Admitting: Family Medicine

## 2020-01-21 NOTE — Telephone Encounter (Signed)
Pt is having a hard time walking. Her feet are swelling. They were trying to check in with me at PCP. She has an appointment at this time [facility] Hopefully they will make her appointment. Her husband would like a call from Dr. Leretha Pol concerning this issue.

## 2020-01-22 ENCOUNTER — Ambulatory Visit: Payer: Medicare Other | Admitting: Physical Therapy

## 2020-01-22 NOTE — Telephone Encounter (Signed)
Is there anything you would like for me to tell this patient before her appt with Cardio

## 2020-01-22 NOTE — Telephone Encounter (Signed)
Patient seen by cards yesterday, note not available for review of yet. They should have done something re her edema. Please call and check in with them. thanks

## 2020-01-23 NOTE — Telephone Encounter (Signed)
I have attempted to call Kaitlyn [name] to get an update on how she is doing however her voicemail box has not been set up yet. So I am unable to leave message.

## 2020-01-27 ENCOUNTER — Encounter: Payer: Medicare Other | Admitting: Speech Pathology

## 2020-01-27 ENCOUNTER — Ambulatory Visit: Payer: Medicare Other | Admitting: Physical Therapy

## 2020-01-29 ENCOUNTER — Encounter: Payer: Medicare Other | Admitting: Speech Pathology

## 2020-01-29 ENCOUNTER — Ambulatory Visit: Payer: Medicare Other | Admitting: Physical Therapy

## 2020-02-01 DIAGNOSIS — Z950 Presence of cardiac pacemaker: Secondary | ICD-10-CM | POA: Insufficient documentation

## 2020-02-01 DIAGNOSIS — J189 Pneumonia, unspecified organism: Secondary | ICD-10-CM | POA: Insufficient documentation

## 2020-02-01 DIAGNOSIS — I5033 Acute on chronic diastolic (congestive) heart failure: Secondary | ICD-10-CM | POA: Insufficient documentation

## 2020-02-01 HISTORY — DX: Presence of cardiac pacemaker: Z95.0

## 2020-02-01 HISTORY — DX: Pneumonia, unspecified organism: J18.9

## 2020-02-01 HISTORY — DX: Acute on chronic diastolic (congestive) heart failure: I50.33

## 2020-02-03 ENCOUNTER — Ambulatory Visit: Payer: Medicare Other | Admitting: Physical Therapy

## 2020-02-05 ENCOUNTER — Ambulatory Visit: Payer: Medicare Other | Admitting: Physical Therapy

## 2020-02-10 ENCOUNTER — Ambulatory Visit: Payer: Medicare Other | Admitting: Physical Therapy

## 2020-02-12 ENCOUNTER — Ambulatory Visit: Payer: Medicare Other | Admitting: Physical Therapy

## 2020-02-17 ENCOUNTER — Ambulatory Visit: Payer: Medicare Other | Admitting: Physical Therapy

## 2020-02-19 ENCOUNTER — Ambulatory Visit: Payer: Medicare Other | Admitting: Physical Therapy

## 2020-02-24 ENCOUNTER — Ambulatory Visit: Payer: Medicare Other | Admitting: Physical Therapy

## 2020-02-26 ENCOUNTER — Ambulatory Visit: Payer: Medicare Other | Admitting: Physical Therapy

## 2020-03-23 NOTE — Telephone Encounter (Signed)
na

## 2020-04-15 ENCOUNTER — Telehealth: Payer: Self-pay | Admitting: Emergency Medicine

## 2020-04-15 LAB — CUP PACEART REMOTE DEVICE CHECK
Battery Remaining Longevity: 165 mo
Battery Voltage: 3.17 V
Brady Statistic AP VP Percent: 0.11 %
Brady Statistic AP VS Percent: 98.87 %
Brady Statistic AS VP Percent: 0 %
Brady Statistic AS VS Percent: 1.05 %
Brady Statistic RA Percent Paced: 93.49 %
Brady Statistic RV Percent Paced: 0.21 %
Date Time Interrogation Session: 20211117203754
Implantable Lead Implant Date: 20091201
Implantable Lead Implant Date: 20091209
Implantable Lead Location: 753859
Implantable Lead Location: 753860
Implantable Lead Model: 4076
Implantable Lead Model: 4076
Implantable Pulse Generator Implant Date: 20210521
Lead Channel Impedance Value: 304 Ohm
Lead Channel Impedance Value: 380 Ohm
Lead Channel Impedance Value: 513 Ohm
Lead Channel Impedance Value: 589 Ohm
Lead Channel Pacing Threshold Amplitude: 0.5 V
Lead Channel Pacing Threshold Amplitude: 0.5 V
Lead Channel Pacing Threshold Pulse Width: 0.4 ms
Lead Channel Pacing Threshold Pulse Width: 0.4 ms
Lead Channel Sensing Intrinsic Amplitude: 17.375 mV
Lead Channel Sensing Intrinsic Amplitude: 17.375 mV
Lead Channel Sensing Intrinsic Amplitude: 3.5 mV
Lead Channel Sensing Intrinsic Amplitude: 3.5 mV
Lead Channel Setting Pacing Amplitude: 1.5 V
Lead Channel Setting Pacing Amplitude: 2.5 V
Lead Channel Setting Pacing Pulse Width: 0.4 ms
Lead Channel Setting Sensing Sensitivity: 5.6 mV

## 2020-04-15 NOTE — Telephone Encounter (Signed)
LMOM to call DC, # and hours provided. Need to assess for s/sx  with AF with RVR. + Eliquis, Toprol XL 50 mg qd.Need to determine if followed at [facility].

## 2020-04-16 ENCOUNTER — Ambulatory Visit (INDEPENDENT_AMBULATORY_CARE_PROVIDER_SITE_OTHER): Payer: Medicare Other

## 2020-04-16 DIAGNOSIS — I495 Sick sinus syndrome: Secondary | ICD-10-CM | POA: Diagnosis not present

## 2020-04-19 ENCOUNTER — Telehealth: Payer: Self-pay

## 2020-04-19 DIAGNOSIS — I4819 Other persistent atrial fibrillation: Secondary | ICD-10-CM

## 2020-04-19 NOTE — Telephone Encounter (Signed)
Spoke with pt and advised her home remote check shows she is in persistent A-FIB.   Discussed need to be seen in A-FIB clinic and will have pt contacted to schedule appointment.  Pt denies current symptoms of CP, SOB or fatigue.  Stressed importance of continuing meds as prescribed.  Pt verbalized understanding and agrees with current plan.

## 2020-04-19 NOTE — Progress Notes (Signed)
Remote pacemaker transmission.   

## 2020-04-19 NOTE — Telephone Encounter (Signed)
-----   Message from Duke Salvia, MD sent at 04/17/2020 10:26 AM EST ----- Remote reviewed. This remote is abnormal for persistent atrial fib   could we arrange visit with afib clinic to discuss amiodarone for recurrent rapid afib and undertake DCCV Thanks SK

## 2020-04-20 NOTE — Telephone Encounter (Signed)
Called and left message for patient to call us to schedule appt.

## 2020-04-26 NOTE — Telephone Encounter (Signed)
Called and spoke with patient.  She is aware of appt 04/28/20 at 11 am with Jorja Loa, PA.

## 2020-04-28 ENCOUNTER — Ambulatory Visit (HOSPITAL_COMMUNITY)
Admission: RE | Admit: 2020-04-28 | Discharge: 2020-04-28 | Disposition: A | Discharge status: Home / Self Care | Payer: Medicare Other | Source: Ambulatory Visit | Attending: Physician Assistant | Admitting: Physician Assistant

## 2020-04-28 ENCOUNTER — Other Ambulatory Visit: Payer: Self-pay

## 2020-04-28 ENCOUNTER — Encounter (HOSPITAL_COMMUNITY): Payer: Self-pay | Admitting: Physician Assistant

## 2020-04-28 VITALS — BP 114/80 | HR 71 | Ht 61.0 in | Wt 133.4 lb

## 2020-04-28 DIAGNOSIS — I495 Sick sinus syndrome: Secondary | ICD-10-CM | POA: Insufficient documentation

## 2020-04-28 DIAGNOSIS — F17290 Nicotine dependence, other tobacco product, uncomplicated: Secondary | ICD-10-CM | POA: Insufficient documentation

## 2020-04-28 DIAGNOSIS — I4819 Other persistent atrial fibrillation: Secondary | ICD-10-CM | POA: Diagnosis not present

## 2020-04-28 DIAGNOSIS — Z79899 Other long term (current) drug therapy: Secondary | ICD-10-CM | POA: Diagnosis not present

## 2020-04-28 DIAGNOSIS — Z7901 Long term (current) use of anticoagulants: Secondary | ICD-10-CM | POA: Insufficient documentation

## 2020-04-28 DIAGNOSIS — I509 Heart failure, unspecified: Secondary | ICD-10-CM | POA: Insufficient documentation

## 2020-04-28 DIAGNOSIS — Z888 Allergy status to other drugs, medicaments and biological substances status: Secondary | ICD-10-CM | POA: Diagnosis not present

## 2020-04-28 DIAGNOSIS — Z823 Family history of stroke: Secondary | ICD-10-CM | POA: Insufficient documentation

## 2020-04-28 DIAGNOSIS — D6869 Other thrombophilia: Secondary | ICD-10-CM | POA: Diagnosis not present

## 2020-04-28 DIAGNOSIS — Z86718 Personal history of other venous thrombosis and embolism: Secondary | ICD-10-CM | POA: Insufficient documentation

## 2020-04-28 DIAGNOSIS — Z950 Presence of cardiac pacemaker: Secondary | ICD-10-CM | POA: Diagnosis not present

## 2020-04-28 DIAGNOSIS — Z8673 Personal history of transient ischemic attack (TIA), and cerebral infarction without residual deficits: Secondary | ICD-10-CM | POA: Insufficient documentation

## 2020-04-28 DIAGNOSIS — I110 Hypertensive heart disease with heart failure: Secondary | ICD-10-CM | POA: Insufficient documentation

## 2020-04-28 LAB — COMPREHENSIVE METABOLIC PANEL
ALT: 23 U/L (ref 0–44)
AST: 36 U/L (ref 15–41)
Albumin: 3.2 g/dL — ABNORMAL LOW (ref 3.5–5.0)
Alkaline Phosphatase: 73 U/L (ref 38–126)
Anion gap: 9 (ref 5–15)
BUN: 21 mg/dL (ref 8–23)
CO2: 30 mmol/L (ref 22–32)
Calcium: 9.9 mg/dL (ref 8.9–10.3)
Chloride: 104 mmol/L (ref 98–111)
Creatinine, Ser: 0.74 mg/dL (ref 0.44–1.00)
GFR, Estimated: >60 mL/min (ref >60)
Glucose, Bld: 125 mg/dL — ABNORMAL HIGH (ref 70–99)
Potassium: 5 mmol/L (ref 3.5–5.1)
Sodium: 143 mmol/L (ref 135–145)
Total Bilirubin: 1.9 mg/dL — ABNORMAL HIGH (ref 0.3–1.2)
Total Protein: 6.2 g/dL — ABNORMAL LOW (ref 6.5–8.1)

## 2020-04-28 LAB — TSH: TSH: 1.349 u[IU]/mL (ref 0.350–4.500)

## 2020-04-28 MED ORDER — AMIODARONE HCL 200 MG PO TABS: ORAL_TABLET | ORAL | 0 refills | Status: DC

## 2020-04-28 NOTE — Patient Instructions (Signed)
STOP propafenone today  On Sunday December 5th Start Amiodarone 200mg  twice a day for the next 2 weeks (12/19) then reduce to 200mg  once a day

## 2020-04-28 NOTE — Progress Notes (Signed)
Primary Care Physician: Myles Lipps, MD (Inactive) Primary Cardiologist: Dr Elease Hashimoto Primary Electrophysiologist: Dr Graciela Husbands Referring Physician: Dr Klein   Kaitlyn [name] is a 79 y.o. female with a history of CVA (while off a/c 2/2 GIB), AFib, remote DVT, HTN, SSSx w/PPM, AS who presents for follow up in the [facility].  Patient was admitted to [facility] 10/15/19 with increasing DOE, SOB, edema, noted with CHF. During her hospitalization, it was noted that her PPM has reached ERI and she underwent a gen change at that time. Patient does have a history of stroke while off anticoagulation. 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 Patient is on Eliquis for a CHADS2VASC score of 7. The device clinic received an alert for persistent afib on 04/15/20 with RVR. Patient reports she was unaware of her afib. There were no triggers that she could identify. She denies any bleeding issues on anticoagulation.   Today, she denies symptoms of palpitations, chest pain, shortness of breath, orthopnea, PND, lower extremity edema, dizziness, presyncope, syncope, snoring, daytime somnolence, bleeding. The patient is tolerating medications without difficulties and is otherwise without complaint today.    Atrial Fibrillation Risk Factors:  she does not have symptoms or diagnosis of sleep apnea. she does not have a history of rheumatic fever.   she has a BMI of Body mass index is 25.21 kg/m.Marland Kitchen Filed Weights   04/28/20 1057  Weight: 60.5 kg    Family History  Problem Relation Age of Onset  . Stroke Father   . Alzheimer's disease Mother      Atrial Fibrillation Management history:  Previous antiarrhythmic drugs: propafenone  Previous cardioversions: none Previous ablations: none CHADS2VASC score: 7 Anticoagulation history: Eliquis   Past Medical History:  Diagnosis Date  . A-fib (HCC)   . Arthritis    both knees   . Chronic pain   . Hypertension   . Presence of permanent cardiac pacemaker   . Stroke [facility])    Past Surgical History:  Procedure Laterality Date  . CERVICAL SPINE SURGERY    . PACEMAKER IMPLANT    . PPM GENERATOR CHANGEOUT N/A 10/17/2019   Procedure: PPM GENERATOR CHANGEOUT;  Surgeon: Duke Salvia, MD;  Location: [facility] INVASIVE CV LAB;  Service: Cardiovascular;  Laterality: N/A;  . shoulder replacement Left     Current Outpatient Medications  Medication Sig Dispense Refill  . albuterol (VENTOLIN HFA) 108 (90 Base) MCG/ACT inhaler Inhale 2 puffs into the lungs every 6 (six) hours as needed for wheezing or shortness of breath. 8 g 2  . apixaban (ELIQUIS) 5 MG TABS tablet Take 1 tablet (5 mg total) by mouth 2 (two) times daily. 60 tablet 5  . citalopram (CELEXA) 10 MG tablet Take 1 tablet (10 mg total) by mouth daily. 30 tablet 5  . esomeprazole (NEXIUM) 20 MG capsule Take 1 capsule (20 mg total) by mouth 2 (two) times daily before a meal. 60 capsule 5  . furosemide (LASIX) 20 MG tablet Take 1 tablet (20 mg total) by mouth in the morning. 30 tablet 5  . lactulose (CHRONULAC) 10 GM/15ML solution Take 30 mLs (20 g total) by mouth 2 (two) times daily. 236 mL 0  . Magnesium 400 MG TABS Take 400 mg by mouth daily. 30 tablet 5  . metoprolol succinate (TOPROL-XL) 50 MG 24 hr tablet Take 1 tablet (50 mg total) by mouth daily. 30 tablet 5  . potassium chloride SA (  KLOR-CON) 20 MEQ tablet Take 1 tablet (20 mEq total) by mouth daily. 30 tablet 5  . propafenone (RYTHMOL) 150 MG tablet Take 150 mg by mouth daily in the afternoon.    Marland Kitchen rOPINIRole (REQUIP) 1 MG tablet Take 1 tablet (1 mg total) by mouth at bedtime. 30 tablet 5   No current facility-administered medications for this encounter.    Allergies  Allergen Reactions  . Lisinopril Cough         Social History   Socioeconomic History  . Marital status: Married    Spouse name: Not on file  . Number of children: Not on file  . Years  of education: Not on file  . Highest education level: Not on file  Occupational History  . Not on file  Tobacco Use  . Smoking status: Current Every Day Smoker    Types: E-cigarettes  . Smokeless tobacco: Never Used  . Tobacco comment: vaping  Vaping Use  . Vaping Use: Every day  . Start date: 10/27/2013  Substance and Sexual Activity  . Alcohol use: Never  . Drug use: Never  . Sexual activity: Not on file  Other Topics Concern  . Not on file  Social History Narrative  . Not on file   Social Determinants of Health   Financial Resource Strain:   . Difficulty of Paying Living Expenses: Not on file  Food Insecurity:   . Worried About Programme researcher, broadcasting/film/video in the Last Year: Not on file  . Ran Out of Food in the Last Year: Not on file  Transportation Needs:   . Lack of Transportation (Medical): Not on file  . Lack of Transportation (Non-Medical): Not on file  Physical Activity:   . Days of Exercise per Week: Not on file  . Minutes of Exercise per Session: Not on file  Stress:   . Feeling of Stress : Not on file  Social Connections:   . Frequency of Communication with Friends and Family: Not on file  . Frequency of Social Gatherings with Friends and Family: Not on file  . Attends Religious Services: Not on file  . Active Member of Clubs or Organizations: Not on file  . Attends Banker Meetings: Not on file  . Marital Status: Not on file  Intimate Partner Violence:   . Fear of Current or Ex-Partner: Not on file  . Emotionally Abused: Not on file  . Physically Abused: Not on file  . Sexually Abused: Not on file     ROS- All systems are reviewed and negative except as per the HPI above.  Physical Exam: Vitals:   04/28/20 1057  BP: 114/80  Pulse: 71  Weight: 60.5 kg  Height: 5\' 1"  (1.549 m)    GEN- The patient is well appearing elderly female, alert and oriented x 3 today.   Head- normocephalic, atraumatic Eyes-  Sclera clear, conjunctiva pink Ears-  hearing intact Oropharynx- clear Neck- supple  Lungs- Clear to ausculation bilaterally, normal work of breathing Heart- Regular rate and rhythm, no rubs or gallops, 2/6 systolic murmur  GI- soft, NT, ND, + BS Extremities- no clubbing, cyanosis, or edema MS- no significant deformity or atrophy Skin- no rash or lesion Psych- euthymic mood, full affect Neuro- strength and sensation are intact  Wt Readings from Last 3 Encounters:  04/28/20 60.5 kg  12/29/19 68 kg  12/26/19 68.4 kg    EKG today demonstrates A paced rhythm HR 71, PAC, PVC, PR 150, QRS 108, QTc 489  Echo 10/16/19 demonstrated  1. Left ventricular ejection fraction, by estimation, is 50%. The left  ventricle has mildly decreased function. The left ventricle demonstrates  global hypokinesis. There is mild left ventricular hypertrophy. Left  ventricular diastolic parameters are  indeterminate. Elevated left ventricular end-diastolic pressure.  2. Right ventricular systolic function is normal. The right ventricular  size is normal. There is mildly elevated pulmonary artery systolic  pressure. The estimated right ventricular systolic pressure is 40.2 mmHg.  3. Left atrial size was moderately dilated.  4. Right atrial size was mildly dilated.  5. The mitral valve is abnormal. Mild to moderate mitral valve  regurgitation. No evidence of mitral stenosis.  6. Tricuspid valve regurgitation is moderate.  7. The aortic valve is severely calcified with severely reduced leaflet  motion. The dimensionless index is 0.17, however LVOT TVI may be  underestimated. 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 The aortic valve is  abnormal. Aortic valve regurgitation is not visualized. Aortic valve mean  gradient measures 21.0 mmHg.  8. The inferior vena cava is dilated in size with <50% respiratory  variability, suggesting right atrial pressure of 15 mmHg.  Epic records are reviewed at length  today  CHA2DS2-VASc Score = 7  The patient's score is based upon: CHF History: 1 HTN History: 1 Diabetes History: 0 Stroke History: 2 Vascular Disease History: 0 Age Score: 2 Gender Score: 1      ASSESSMENT AND PLAN: 1. Persistent Atrial Fibrillation (ICD10:  I48.19) The patient's CHA2DS2-VASc score is 7, indicating a 11.2% annual risk of stroke.   Patient back in SR today but having recurrent rapid afib. We discussed therapeutic options today including changing AAD. Will plan to stop propafenone and start amiodarone 200 mg BID 3 days later. Decrease amiodarone to 200 mg daily after 2 weeks. Check Cmet/TSH today. Continue Eliquis 5 mg BID Continue Toprol 50 mg daily  2. Secondary Hypercoagulable State (ICD10:  D68.69) The patient is at significant risk for stroke/thromboembolism based upon her CHA2DS2-VASc Score of 7.  Continue Apixaban (Eliquis).   3. SSS S/p PPM, followed by Dr Graciela Husbands and the device clinic.  4. HTN Stable, no changes today.   Follow up in the AF clinic in 2-3 weeks.   Jorja Loa PA-C Afib Clinic [facility] [address] [place], [place] [address] [phone] 04/28/2020 11:17 AM

## 2020-04-28 NOTE — Telephone Encounter (Signed)
Seen today in AF clinic .

## 2020-05-04 ENCOUNTER — Other Ambulatory Visit (HOSPITAL_COMMUNITY): Payer: Medicare Other

## 2020-05-11 ENCOUNTER — Ambulatory Visit (HOSPITAL_COMMUNITY): Payer: Medicare Other | Admitting: Physician Assistant

## 2020-05-24 ENCOUNTER — Other Ambulatory Visit: Payer: Self-pay

## 2020-05-24 ENCOUNTER — Encounter (HOSPITAL_COMMUNITY): Payer: Self-pay | Admitting: Physician Assistant

## 2020-05-24 ENCOUNTER — Ambulatory Visit (HOSPITAL_COMMUNITY)
Admission: RE | Admit: 2020-05-24 | Discharge: 2020-05-24 | Disposition: A | Discharge status: Home / Self Care | Payer: Medicare Other | Source: Ambulatory Visit | Attending: Physician Assistant | Admitting: Physician Assistant

## 2020-05-24 VITALS — BP 110/70 | HR 85 | Ht 61.0 in | Wt 137.4 lb

## 2020-05-24 DIAGNOSIS — I4819 Other persistent atrial fibrillation: Secondary | ICD-10-CM | POA: Insufficient documentation

## 2020-05-24 DIAGNOSIS — Z79899 Other long term (current) drug therapy: Secondary | ICD-10-CM | POA: Insufficient documentation

## 2020-05-24 DIAGNOSIS — Z7901 Long term (current) use of anticoagulants: Secondary | ICD-10-CM | POA: Insufficient documentation

## 2020-05-24 DIAGNOSIS — I495 Sick sinus syndrome: Secondary | ICD-10-CM | POA: Insufficient documentation

## 2020-05-24 DIAGNOSIS — I10 Essential (primary) hypertension: Secondary | ICD-10-CM | POA: Insufficient documentation

## 2020-05-24 DIAGNOSIS — Z950 Presence of cardiac pacemaker: Secondary | ICD-10-CM | POA: Insufficient documentation

## 2020-05-24 DIAGNOSIS — Z8673 Personal history of transient ischemic attack (TIA), and cerebral infarction without residual deficits: Secondary | ICD-10-CM | POA: Insufficient documentation

## 2020-05-24 DIAGNOSIS — F17290 Nicotine dependence, other tobacco product, uncomplicated: Secondary | ICD-10-CM | POA: Insufficient documentation

## 2020-05-24 DIAGNOSIS — D6869 Other thrombophilia: Secondary | ICD-10-CM | POA: Diagnosis not present

## 2020-05-24 DIAGNOSIS — Z86718 Personal history of other venous thrombosis and embolism: Secondary | ICD-10-CM | POA: Insufficient documentation

## 2020-05-24 DIAGNOSIS — Z888 Allergy status to other drugs, medicaments and biological substances status: Secondary | ICD-10-CM | POA: Insufficient documentation

## 2020-05-24 DIAGNOSIS — Z823 Family history of stroke: Secondary | ICD-10-CM | POA: Diagnosis not present

## 2020-05-24 NOTE — Patient Instructions (Signed)
Increase amiodarone to 200mg  twice a day  Send transmission in 2 weeks (January 10th) and will be in touch with you once we see results.

## 2020-05-24 NOTE — Progress Notes (Signed)
Primary Care Physician: [name], [name], MD Primary Cardiologist: Dr Elease Hashimoto Primary Electrophysiologist: Dr Graciela Husbands Referring Physician: Dr Klein   Kaitlyn [name] is a 79 y.o. 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  Patient was admitted to [facility] 10/15/19 with increasing DOE, SOB, edema, noted with CHF. During her hospitalization, it was noted that her PPM has reached ERI and she underwent a gen change at that time. Patient does have a history of stroke while off anticoagulation. 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 Patient is on Eliquis for a CHADS2VASC score of 7. The device clinic received an alert for persistent afib on 04/15/20 with RVR. Patient reports she was unaware of her afib. There were no triggers that she could identify.   On follow up today, patient reports she has done well since her last visit. She is in rate controlled afib today after starting amiodarone. She denies any awareness of her arrhythmia. She denies any bleeding issues on anticoagulation.   Today, she denies symptoms of palpitations, chest pain, shortness of breath, orthopnea, PND, lower extremity edema, dizziness, presyncope, syncope, snoring, daytime somnolence, bleeding. The patient is tolerating medications without difficulties and is otherwise without complaint today.    Atrial Fibrillation Risk Factors:  she does not have symptoms or diagnosis of sleep apnea. she does not have a history of rheumatic fever.   she has a BMI of Body mass index is 25.96 kg/m.Marland Kitchen Filed Weights   05/24/20 1053  Weight: 62.3 kg    Family History  Problem Relation Age of Onset  . Stroke Father   . Alzheimer's disease Mother      Atrial Fibrillation Management history:  Previous antiarrhythmic drugs: propafenone, amiodarone   Previous cardioversions: none Previous ablations: none CHADS2VASC score: 7 Anticoagulation history: Eliquis   Past Medical History:  Diagnosis Date  . A-fib (HCC)   . Arthritis    both knees  . Chronic pain   . Hypertension   . Presence of permanent cardiac pacemaker   . Stroke [facility])    Past Surgical History:  Procedure Laterality Date  . CERVICAL SPINE SURGERY    . PACEMAKER IMPLANT    . PPM GENERATOR CHANGEOUT N/A 10/17/2019   Procedure: PPM GENERATOR CHANGEOUT;  Surgeon: Duke Salvia, MD;  Location: [facility] INVASIVE CV LAB;  Service: Cardiovascular;  Laterality: N/A;  . shoulder replacement Left     Current Outpatient Medications  Medication Sig Dispense Refill  . albuterol (VENTOLIN HFA) 108 (90 Base) MCG/ACT inhaler Inhale 2 puffs into the lungs every 6 (six) hours as needed for wheezing or shortness of breath. 8 g 2  . amiodarone (PACERONE) 200 MG tablet Take 1 tablet by mouth twice a day for 2 weeks then reduce to 1 tablet daily 60 tablet 0  . apixaban (ELIQUIS) 5 MG TABS tablet Take 1 tablet (5 mg total) by mouth 2 (two) times daily. 60 tablet 5  . citalopram (CELEXA) 10 MG tablet Take 1 tablet (10 mg total) by mouth daily. 30 tablet 5  . esomeprazole (NEXIUM) 20 MG capsule Take 1 capsule (20 mg total) by mouth 2 (two) times daily before a meal. 60 capsule 5  . furosemide (LASIX) 20 MG tablet Take 1 tablet (20 mg total) by mouth in the morning. 30 tablet 5  . Iron-Vitamins (  GERITOL COMPLETE) TABS Take by mouth.    . Magnesium 400 MG TABS Take 400 mg by mouth daily. 30 tablet 5  . metoprolol succinate (TOPROL-XL) 50 MG 24 hr tablet Take 1 tablet (50 mg total) by mouth daily. 30 tablet 5  . Multiple Vitamins-Minerals (MULTIVITAMIN WOMEN 50+ PO) Take by mouth.    . potassium chloride SA (KLOR-CON) 20 MEQ tablet Take 1 tablet (20 mEq total) by mouth daily. 30 tablet 5  . rOPINIRole (REQUIP) 1 MG tablet Take 1 tablet (1 mg total) by mouth at bedtime. 30 tablet 5   No current  facility-administered medications for this encounter.    Allergies  Allergen Reactions  . Lisinopril Cough         Social History   Socioeconomic History  . Marital status: Married    Spouse name: Not on file  . Number of children: Not on file  . Years of education: Not on file  . Highest education level: Not on file  Occupational History  . Not on file  Tobacco Use  . Smoking status: Current Every Day Smoker    Types: E-cigarettes  . Smokeless tobacco: Never Used  . Tobacco comment: vaping all day  Vaping Use  . Vaping Use: Every day  . Start date: 10/27/2013  Substance and Sexual Activity  . Alcohol use: Never  . Drug use: Never  . Sexual activity: Not on file  Other Topics Concern  . Not on file  Social History Narrative  . Not on file   Social Determinants of Health   Financial Resource Strain: Not on file  Food Insecurity: Not on file  Transportation Needs: Not on file  Physical Activity: Not on file  Stress: Not on file  Social Connections: Not on file  Intimate Partner Violence: Not on file     ROS- All systems are reviewed and negative except as per the HPI above.  Physical Exam: Vitals:   05/24/20 1053  BP: 110/70  Pulse: 85  Weight: 62.3 kg  Height: 5\' 1"  (1.549 m)    GEN- The patient is well appearing elderly female, alert and oriented x 3 today.   HEENT-head normocephalic, atraumatic, sclera clear, conjunctiva pink, hearing intact, trachea midline. Lungs- Clear to ausculation bilaterally, normal work of breathing Heart- irregular rate and rhythm, no rubs or gallops, 3/6 systolic murmur  GI- soft, NT, ND, + BS Extremities- no clubbing, cyanosis, or edema MS- no significant deformity or atrophy Skin- no rash or lesion Psych- euthymic mood, full affect Neuro- strength and sensation are intact   Wt Readings from Last 3 Encounters:  05/24/20 62.3 kg  04/28/20 60.5 kg  12/29/19 68 kg    EKG today demonstrates afib HR 85, intermittent V  pacing, NST, QRS 96, QTc 471  Echo 10/16/19 demonstrated  1. Left ventricular ejection fraction, by estimation, is 50%. The left  ventricle has mildly decreased function. The left ventricle demonstrates  global hypokinesis. There is mild left ventricular hypertrophy. Left  ventricular diastolic parameters are  indeterminate. Elevated left ventricular end-diastolic pressure.  2. Right ventricular systolic function is normal. The right ventricular  size is normal. There is mildly elevated pulmonary artery systolic  pressure. The estimated right ventricular systolic pressure is 40.2 mmHg.  3. Left atrial size was moderately dilated.  4. Right atrial size was mildly dilated.  5. The mitral valve is abnormal. Mild to moderate mitral valve  regurgitation. No evidence of mitral stenosis.  6. Tricuspid valve regurgitation is moderate.  7. The aortic valve is severely calcified with severely reduced leaflet  motion. The dimensionless index is 0.17, however LVOT TVI may be  underestimated. 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 The aortic valve is  abnormal. Aortic valve regurgitation is not visualized. Aortic valve mean  gradient measures 21.0 mmHg.  8. The inferior vena cava is dilated in size with <50% respiratory  variability, suggesting right atrial pressure of 15 mmHg.  Epic records are reviewed at length today  CHA2DS2-VASc Score = 7  The patient's score is based upon: CHF History: Yes HTN History: Yes Diabetes History: No Stroke History: Yes Vascular Disease History: No Age Score: 2 Gender Score: 1      ASSESSMENT AND PLAN: 1. Persistent Atrial Fibrillation (ICD10:  I48.19) The patient's CHA2DS2-VASc score is 7, indicating a 11.2% annual risk of stroke.   Patient back in rate controlled afib.  We discussed therapeutic options today including increasing amiodarone and DCCV. Will plan to increase amiodarone to 200 mg BID for 2 weeks.  Patient to send in a manual transmission at that time to assess rhythm control. If she is persistently in afib, will consider DCCV. Continue Eliquis 5 mg BID Continue Toprol 50 mg daily  2. Secondary Hypercoagulable State (ICD10:  D68.69) The patient is at significant risk for stroke/thromboembolism based upon her CHA2DS2-VASc Score of 7.  Continue Apixaban (Eliquis).   3. SSS S/p PPM, followed by Dr Graciela Husbands and the device clinic.  4. HTN Stable, no changes today.   Follow up with manual transmission in 2 weeks. AF clinic in 3 months, sooner if DCCV needed.    Jorja Loa PA-C Afib Clinic [facility] [address] [place], [place] [address] [phone] 05/24/2020 11:54 AM

## 2020-06-02 ENCOUNTER — Other Ambulatory Visit (HOSPITAL_COMMUNITY): Payer: Self-pay | Admitting: *Deleted

## 2020-06-02 MED ORDER — AMIODARONE HCL 200 MG PO TABS
ORAL_TABLET | ORAL | 0 refills | Status: DC
Start: 2020-06-02 — End: 2020-06-08

## 2020-06-08 ENCOUNTER — Telehealth (HOSPITAL_COMMUNITY): Payer: Self-pay | Admitting: *Deleted

## 2020-06-08 MED ORDER — AMIODARONE HCL 200 MG PO TABS
200.0000 mg | ORAL_TABLET | Freq: Every day | ORAL | 1 refills | Status: DC
Start: 2020-06-08 — End: 2020-07-12

## 2020-06-08 NOTE — Telephone Encounter (Signed)
Per transmission 1/10 back in NSR can reduce amiodarone back to 200mg  once a day and keep scheduled follow up per PA.

## 2020-07-11 ENCOUNTER — Encounter: Payer: Self-pay | Admitting: Cardiovascular Disease

## 2020-07-11 NOTE — Progress Notes (Signed)
Cardiology Office Note:    Date:  07/12/2020   ID:  Kaitlyn [name], DOB 1940-10-05, MRN [id number]  PCP:  Jacelyn Pi, Lilia Argue, MD  [facility] Cardiologist:  Mertie Moores, MD , Dr.  Novella Rob at [facility] Electrophysiologist:  None   Referring MD: No ref. provider found   Chief Complaint  Patient presents with  . Aortic Stenosis  . Atrial Fibrillation    December 16, 2019   Kaitlyn [name] is a 80 y.o. female with a hx of paroxysmal atrial fibrillation, history of GI bleed, aortic stenosis, pacemaker implantation.  She has moderate to severe aortic stenosis by echo previously in the [facility] system.  I met her in the hospital in May, 2021 when she presented with 3 days of progressive lower extremity edema and as well as increasing shortness of breath.  She had mildly elevated troponin levels.  She had a generator change on May 21.  Hx of Afib,  S/p recent stroke.  VSD, DVT HTN  Has been getting speech therapy - swallow eval / coaching  Has had a swallowing study.   Found to have very slow transition of food and liquids. She gets choked with eating   Echo from May, 2021 shows EF 50%.  Mild - moderate pulmonary HTN - est PA pressure of 40 Moderate AS - mean AV gradeitn of 21 mmhg .  Has chronic rales , rhonchi in her right base  Feb. 14, 2022: Kaitlyn [name] is seen today for follow up of her PAF, aortic stenosis, pacer implantation, She is s/p stroke.   Has a hx of chronic aspiration.  She chokes while eating.  Has chronic rales in right base. Has been seen by the [facility]  Cardiology at [place]  for atrial fib and new LVEF of 35-40%.  TEE at [facility] showed ? Of LAA thrombus.   Was transferred to [facility],  TEE did not show LAA thrombus and she had a cardioversion on 02/05/20.  TEE showed a mean AV gradient of 29 mm Hg - was thought to be more severe than that. R/LHC showed no CAD , normal filling pressures and moderate AS .  Echo today shows AV gradient of 42 mmhg.    Does her normal activities without any CP, Has some DOE with exertion.  Able to climb steps  No syncope  Has a pacer  Still chokes when she eats and drinks - usually occurs once a day  Has a chronic cough which is due to chronic aspiration I suspect.    Past Medical History:  Diagnosis Date  . A-fib ([place])   . Arthritis    both knees  . Chronic pain   . Hypertension   . Presence of permanent cardiac pacemaker   . Stroke [facility])     Past Surgical History:  Procedure Laterality Date  . CERVICAL SPINE SURGERY    . PACEMAKER IMPLANT    . PPM GENERATOR CHANGEOUT N/A 10/17/2019   Procedure: PPM GENERATOR CHANGEOUT;  Surgeon: Deboraha Sprang, MD;  Location: [facility] CV LAB;  Service: Cardiovascular;  Laterality: N/A;  . shoulder replacement Left     Current Medications: Current Meds  Medication Sig  . albuterol (VENTOLIN HFA) 108 (90 Base) MCG/ACT inhaler Inhale 2 puffs into the lungs every 6 (six) hours as needed for wheezing or shortness of breath.  Marland Kitchen amiodarone (PACERONE) 200 MG tablet Take Amiodarone 229m daily except for Sundays.  .Marland Kitchenapixaban (ELIQUIS) 5 MG TABS tablet  Take 1 tablet (5 mg total) by mouth 2 (two) times daily.  Marland Kitchen esomeprazole (NEXIUM) 20 MG capsule Take 1 capsule (20 mg total) by mouth 2 (two) times daily before a meal.  . furosemide (LASIX) 20 MG tablet Take 1 tablet (20 mg total) by mouth in the morning.  . Iron-Vitamins (GERITOL COMPLETE) TABS Take by mouth.  . Magnesium 400 MG TABS Take 400 mg by mouth daily.  . metoprolol succinate (TOPROL-XL) 50 MG 24 hr tablet Take 1 tablet (50 mg total) by mouth daily.  . Multiple Vitamins-Minerals (MULTIVITAMIN WOMEN 50+ PO) Take by mouth.  . potassium chloride SA (KLOR-CON) 20 MEQ tablet Take 1 tablet (20 mEq total) by mouth daily.  Marland Kitchen rOPINIRole (REQUIP) 1 MG tablet Take 1 tablet (1 mg total) by mouth at bedtime.  . [DISCONTINUED] amiodarone (PACERONE) 200 MG tablet Take 1 tablet (200 mg total) by mouth daily.  .  [DISCONTINUED] citalopram (CELEXA) 10 MG tablet Take 1 tablet (10 mg total) by mouth daily.     Allergies:   Lisinopril   Social History   Socioeconomic History  . Marital status: Married    Spouse name: Not on file  . Number of children: Not on file  . Years of education: Not on file  . Highest education level: Not on file  Occupational History  . Not on file  Tobacco Use  . Smoking status: Current Every Day Smoker    Types: E-cigarettes  . Smokeless tobacco: Never Used  . Tobacco comment: vaping all day  Vaping Use  . Vaping Use: Every day  . Start date: 10/27/2013  Substance and Sexual Activity  . Alcohol use: Never  . Drug use: Never  . Sexual activity: Not on file  Other Topics Concern  . Not on file  Social History Narrative  . Not on file   Social Determinants of Health   Financial Resource Strain: Not on file  Food Insecurity: Not on file  Transportation Needs: Not on file  Physical Activity: Not on file  Stress: Not on file  Social Connections: Not on file     Family History: The patient's family history includes Alzheimer's disease in her mother; Stroke in her father.  ROS:   Please see the history of present illness.     All other systems reviewed and are negative.  EKGs/Labs/Other Studies Reviewed:    The following studies were reviewed today:   EKG:    Recent Labs: 10/15/2019: B Natriuretic Peptide 1,346.3 10/16/2019: Magnesium 1.7 12/29/2019: Hemoglobin 14.1; Platelets 207 04/28/2020: ALT 23; BUN 21; Creatinine, Ser 0.74; Potassium 5.0; Sodium 143; TSH 1.349  Recent Lipid Panel    Component Value Date/Time   CHOL 139 11/13/2019 1136   TRIG 113 11/13/2019 1136   HDL 44 11/13/2019 1136   CHOLHDL 3.2 11/13/2019 1136   LDLCALC 74 11/13/2019 1136    Physical Exam:    Physical Exam: Blood pressure 134/86, pulse 79, height '5\' 1"'  (1.549 m), weight 145 lb 9.6 oz (66 kg), SpO2 98 %.  GEN:  Elderly female,   NAD . Slightly weak  HEENT:  Normal NECK: No JVD; No carotid bruits , radiation of systolic murmur  LYMPHATICS: No lymphadenopathy CARDIAC: RRR , 3/6 systolic murmur radiating to RSB and carotids  RESPIRATORY:  Clear to auscultation without rales, wheezing or rhonchi  ABDOMEN: Soft, non-tender, non-distended MUSCULOSKELETAL:  No edema; No deformity  SKIN: Warm and dry NEUROLOGIC:  Slightly weak ,     ASSESSMENT:  1. Aortic valve stenosis, etiology of cardiac valve disease unspecified   2. Paroxysmal atrial fibrillation (HCC)    PLAN:    In order of problems listed above:  1. Aortic stenosis: .  Prelim views of the echo today suggest that her AS is now severe.   She has improved significantly from her stroke but is still aspirating on occasion . At this point, I think she needs a referral to the valve clinic to be followed there.    .  2. Afib : Continue Eliquis ,  She is on amiodarone.   Will reduce the amio to 200 mg just 6 days a week.    Medication Adjustments/Labs and Tests Ordered: Current medicines are reviewed at length with the patient today.  Concerns regarding medicines are outlined above.  Orders Placed This Encounter  Procedures  . Ambulatory referral to Structural Heart/Valve Clinic (only at [place])   Meds ordered this encounter  Medications  . amiodarone (PACERONE) 200 MG tablet    Sig: Take Amiodarone 213m daily except for Sundays.    Dispense:  90 tablet    Refill:  3    Patient Instructions  Medication Instructions:  Your physician has recommended you make the following change in your medication:    DECREASE Amiodarone to 2011mdaily except on Sundays.    *If you need a refill on your cardiac medications before your next appointment, please call your pharmacy*   Lab Work: none If you have labs (blood work) drawn today and your tests are completely normal, you will receive your results only by: . Marland KitchenyChart Message (if you have MyChart) OR . A paper copy in the mail If  you have any lab test that is abnormal or we need to change your treatment, we will call you to review the results.   Testing/Procedures: none   Follow-Up: [facility].  As part of our continuing mission to provide you with exceptional heart care, we have created designated Provider Care Teams.  These Care Teams include your primary Cardiologist (physician) and Advanced Practice Providers (APPs -  Physician Assistants and Nurse Practitioners) who all work together to provide you with the care you need, when you need it.  We recommend signing up for the patient portal called "MyChart".  Sign up information is provided on this After Visit Summary.  MyChart is used to connect with patients for Virtual Visits (Telemedicine).  Patients are able to view lab/test results, encounter notes, upcoming appointments, etc.  Non-urgent messages can be sent to your provider as well.   To learn more about what you can do with MyChart, go to [email or web address]   Your next appointment:   6 month(s)  The format for your next appointment:   In Person  Provider:   You may see PhMertie MooresMD or one of the following Advanced Practice Providers on your designated Care Team:    ScRichardson DoppPA-C  ViRobbie LisPAVerm[place]   Signed, PhMertie MooresMD  07/12/2020 12:00 PM    [place]

## 2020-07-12 ENCOUNTER — Ambulatory Visit (HOSPITAL_COMMUNITY): Payer: Medicare Other | Attending: Cardiology

## 2020-07-12 ENCOUNTER — Ambulatory Visit (INDEPENDENT_AMBULATORY_CARE_PROVIDER_SITE_OTHER): Payer: Medicare Other | Admitting: Cardiovascular Disease

## 2020-07-12 ENCOUNTER — Encounter: Payer: Self-pay | Admitting: Cardiovascular Disease

## 2020-07-12 ENCOUNTER — Other Ambulatory Visit: Payer: Self-pay

## 2020-07-12 VITALS — BP 134/86 | HR 79 | Ht 61.0 in | Wt 145.6 lb

## 2020-07-12 DIAGNOSIS — I480 Paroxysmal atrial fibrillation: Secondary | ICD-10-CM | POA: Diagnosis not present

## 2020-07-12 DIAGNOSIS — I350 Nonrheumatic aortic (valve) stenosis: Secondary | ICD-10-CM | POA: Insufficient documentation

## 2020-07-12 LAB — ECHOCARDIOGRAM COMPLETE
AR max vel: 0.51 cm2
AV Area VTI: 0.49 cm2
AV Area mean vel: 0.47 cm2
AV Mean grad: 41 mmHg
AV Peak grad: 65.2 mmHg
Ao pk vel: 4.04 m/s
Area-P 1/2: 3.77 cm2
S' Lateral: 2.9 cm

## 2020-07-12 MED ORDER — AMIODARONE HCL 200 MG PO TABS: ORAL_TABLET | ORAL | 3 refills | Status: DC

## 2020-07-12 NOTE — Patient Instructions (Signed)
Medication Instructions:  Your physician has recommended you make the following change in your medication:    DECREASE Amiodarone to 200mg  daily except on Sundays.    *If you need a refill on your cardiac medications before your next appointment, please call your pharmacy*   Lab Work: none If you have labs (blood work) drawn today and your tests are completely normal, you will receive your results only by: 09[birth year]10-1971 MyChart Message (if you have MyChart) OR . A paper copy in the mail If you have any lab test that is abnormal or we need to change your treatment, we will call you to review the results.   Testing/Procedures: none   Follow-Up: At [facility], you and your health needs are our priority.  As part of our continuing mission to provide you with exceptional heart care, we have created designated Provider Care Teams.  These Care Teams include your primary Cardiologist (physician) and Advanced Practice Providers (APPs -  Physician Assistants and Nurse Practitioners) who all work together to provide you with the care you need, when you need it.  We recommend signing up for the patient portal called "MyChart".  Sign up information is provided on this After Visit Summary.  MyChart is used to connect with patients for Virtual Visits (Telemedicine).  Patients are able to view lab/test results, encounter notes, upcoming appointments, etc.  Non-urgent messages can be sent to your provider as well.   To learn more about what you can do with MyChart, go to [facility].    Your next appointment:   6 month(s)  The format for your next appointment:   In Person  Provider:   You may see [email or web address], MD or one of the following Advanced Practice Providers on your designated Care Team:    [name], PA-C  [place], [place]

## 2020-07-14 ENCOUNTER — Other Ambulatory Visit: Payer: Self-pay | Admitting: Cardiovascular Disease

## 2020-07-14 DIAGNOSIS — I5033 Acute on chronic diastolic (congestive) heart failure: Secondary | ICD-10-CM

## 2020-07-14 LAB — CUP PACEART REMOTE DEVICE CHECK
Battery Remaining Longevity: 159 mo
Battery Voltage: 3.13 V
Brady Statistic AP VP Percent: 0.15 %
Brady Statistic AP VS Percent: 81.44 %
Brady Statistic AS VP Percent: 0 %
Brady Statistic AS VS Percent: 18.4 %
Brady Statistic RA Percent Paced: 81.66 %
Brady Statistic RV Percent Paced: 0.16 %
Date Time Interrogation Session: 20220215224153
Implantable Lead Implant Date: 20091201
Implantable Lead Implant Date: 20091209
Implantable Lead Location: 753859
Implantable Lead Location: 753860
Implantable Lead Model: 4076
Implantable Lead Model: 4076
Implantable Pulse Generator Implant Date: 20210521
Lead Channel Impedance Value: 285 Ohm
Lead Channel Impedance Value: 342 Ohm
Lead Channel Impedance Value: 494 Ohm
Lead Channel Impedance Value: 532 Ohm
Lead Channel Pacing Threshold Amplitude: 0.5 V
Lead Channel Pacing Threshold Amplitude: 0.5 V
Lead Channel Pacing Threshold Pulse Width: 0.4 ms
Lead Channel Pacing Threshold Pulse Width: 0.4 ms
Lead Channel Sensing Intrinsic Amplitude: 11.5 mV
Lead Channel Sensing Intrinsic Amplitude: 11.5 mV
Lead Channel Sensing Intrinsic Amplitude: 2.75 mV
Lead Channel Sensing Intrinsic Amplitude: 2.75 mV
Lead Channel Setting Pacing Amplitude: 1.5 V
Lead Channel Setting Pacing Amplitude: 2.5 V
Lead Channel Setting Pacing Pulse Width: 0.4 ms
Lead Channel Setting Sensing Sensitivity: 5.6 mV

## 2020-07-15 ENCOUNTER — Telehealth: Payer: Self-pay | Admitting: Cardiovascular Disease

## 2020-07-15 DIAGNOSIS — I5033 Acute on chronic diastolic (congestive) heart failure: Secondary | ICD-10-CM

## 2020-07-15 MED ORDER — APIXABAN 5 MG PO TABS
5.0000 mg | ORAL_TABLET | Freq: Two times a day (BID) | ORAL | 5 refills | Status: DC
Start: 2020-07-15 — End: 2021-03-01

## 2020-07-15 MED ORDER — FUROSEMIDE 20 MG PO TABS: 20.0000 mg | ORAL_TABLET | Freq: Every morning | ORAL | 5 refills | Status: DC

## 2020-07-15 MED ORDER — POTASSIUM CHLORIDE CRYS ER 20 MEQ PO TBCR
20.0000 meq | EXTENDED_RELEASE_TABLET | Freq: Every day | ORAL | 5 refills | Status: DC
Start: 2020-07-15 — End: 2021-02-17

## 2020-07-15 NOTE — Telephone Encounter (Signed)
Pt saw Dr. Elease Hashimoto 07/12/20 and no med changes for listed meds that need to be refilled.. will go ahead and refill for the pt.   Advised pt to talk with her PCP about her Nexium.

## 2020-07-15 NOTE — Telephone Encounter (Signed)
   *  STAT* If patient is at the pharmacy, call can be transferred to refill team.   1. Which medications need to be refilled? (please list name of each medication and dose if known) esomeprazole (NEXIUM) 20 MG capsule  furosemide (LASIX) 20 MG tablet    apixaban (ELIQUIS) 5 MG TABS tablet    potassium chloride SA (KLOR-CON) 20 MEQ tablet   2. Which pharmacy/location (including street and city if local pharmacy) is medication to be sent to? [facility] - [place], [place] - [address]  3. Do they need a 30 day or 90 day supply? 90 days

## 2020-07-15 NOTE — Telephone Encounter (Signed)
Pt's pharmacy is requesting a refill on furosemide, apixaban (Eliquis), potassium and esomeprazole. These medications are refilled with PCP. Would Dr. Elease Hashimoto like to refill these medications? Please address

## 2020-07-16 ENCOUNTER — Ambulatory Visit (INDEPENDENT_AMBULATORY_CARE_PROVIDER_SITE_OTHER): Payer: Medicare Other

## 2020-07-16 DIAGNOSIS — I495 Sick sinus syndrome: Secondary | ICD-10-CM

## 2020-07-20 NOTE — Progress Notes (Signed)
Remote pacemaker transmission.   

## 2020-07-28 ENCOUNTER — Ambulatory Visit (INDEPENDENT_AMBULATORY_CARE_PROVIDER_SITE_OTHER): Payer: Medicare (Managed Care) | Admitting: Cardiovascular Disease

## 2020-07-28 ENCOUNTER — Encounter: Payer: Self-pay | Admitting: Cardiovascular Disease

## 2020-07-28 ENCOUNTER — Other Ambulatory Visit: Payer: Self-pay

## 2020-07-28 VITALS — BP 120/74 | HR 73 | Ht 61.0 in | Wt 140.2 lb

## 2020-07-28 DIAGNOSIS — Z01812 Encounter for preprocedural laboratory examination: Secondary | ICD-10-CM

## 2020-07-28 DIAGNOSIS — I350 Nonrheumatic aortic (valve) stenosis: Secondary | ICD-10-CM

## 2020-07-28 LAB — BASIC METABOLIC PANEL
BUN/Creatinine Ratio: 32 — ABNORMAL HIGH (ref 12–28)
BUN: 25 mg/dL (ref 8–27)
CO2: 23 mmol/L (ref 20–29)
Calcium: 9.6 mg/dL (ref 8.7–10.3)
Chloride: 101 mmol/L (ref 96–106)
Creatinine, Ser: 0.79 mg/dL (ref 0.57–1.00)
Glucose: 79 mg/dL (ref 65–99)
Potassium: 4 mmol/L (ref 3.5–5.2)
Sodium: 143 mmol/L (ref 134–144)
eGFR: 77 mL/min/{1.73_m2} (ref >59)

## 2020-07-28 NOTE — Patient Instructions (Signed)
Medication Instructions:  No changes *If you need a refill on your cardiac medications before your next appointment, please call your pharmacy*   Lab Work: Today: BMET If you have labs (blood work) drawn today and your tests are completely normal, you will receive your results only by: Marland Kitchen MyChart Message (if you have MyChart) OR . A paper copy in the mail If you have any lab test that is abnormal or we need to change your treatment, we will call you to review the results.   Testing/Procedures: Julieta Gutting, RN,  Structural Heart Nurse Navigator will contact you regarding setting up CT scans.

## 2020-07-28 NOTE — Progress Notes (Signed)
Structural Heart Clinic Consult Note  Chief Complaint  Patient presents with  . 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 Elease HashimotoNahser, for further discussion regarding her aortic stenosis and possible TAVR. She had a stroke and his issues with aspiration and gets choked easily when eating. She has paroxysmal atrial fibrillation and is on Eliquis. She had evidence of moderate aortic stenosis by echo in May 2021 with mean gradient of 21 mmHg. She was admitted to [facility] September 2021 with dyspnea. She was found to be in atrial fibrillation with RVR and LVEF was noted to be 35-40%. [facility] was [facility]. TTE on 02/09/20 showed LVEF 55-60% with severely thickened leaflets with severely reduced leaflet excursion. There is moderately severe stenosis with peak and mean gradients across the AoV of 47 and 29mmHg, AVA 0.55cm2. No evidence of left atrial appendage thrombus.  She was cardioverted to sinus rhythm. Right and left heart cath September 2021 with no evidence of CAD, normal filling pressures and likely moderate aortic stenosis. Echo 07/12/20 with LVEF=60-65%, moderate LVH, mild mitral regurgitation. The aortic valve has in indeterminate number of cusps. The aortic valve leaflets are thickened and leaflet excursion is limited. Severe aortic valve stenosis with mean gradient 41 mmHg, peak gradient 65 mmHg, AVA 0.49 cm2, dimensionless index 0.17, SVI 32.   She tells me today that she has had progressive fatigue and dyspnea. No chest pain. She has some LE edema but this has improved with Lasix. She notes some memory issues following her stroke last year. 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 [place] in [place], [place]. She has no active dental issues. She sees a Education officer, communitydentist regularly. She smoked cigarettes for 50 years but has stopped. She now uses e-cigarettes.   Primary Care Physician: Lezlie LyeSantiago Lago, Meda CoffeeIrma M, MD Primary Cardiologist: Nahser Referring Cardiologist: Nahser  Past Medical History:  Diagnosis Date  . A-fib (HCC)   . Aortic stenosis   . Arthritis    both knees  . Chronic pain   . Presence of permanent cardiac pacemaker   . Sick sinus syndrome (HCC)   . Stroke [facility](HCC)     Past Surgical History:  Procedure Laterality Date  . CERVICAL SPINE SURGERY    . PACEMAKER IMPLANT    . PPM GENERATOR CHANGEOUT N/A 10/17/2019   Procedure: PPM GENERATOR CHANGEOUT;  Surgeon: Duke SalviaKlein, Steven C, MD;  [facility];  Service: Cardiovascular;  Laterality: N/A;  . shoulder replacement Left     Current Outpatient Medications  Medication Sig Dispense Refill  . albuterol (VENTOLIN HFA) 108 (90 Base) MCG/ACT inhaler Inhale 2 puffs into the lungs every 6 (six) hours as needed for wheezing or shortness of breath. 8 g 2  . amiodarone (PACERONE) 200 MG tablet Take Amiodarone 200mg  daily except for Sundays. 90 tablet 3  . apixaban (ELIQUIS) 5 MG TABS tablet Take 1 tablet (5 mg total) by mouth 2 (two) times daily. 60 tablet 5  . furosemide (LASIX) 20 MG tablet Take 1 tablet (20 mg total) by mouth in the morning. 30 tablet 5  . Iron-Vitamins (GERITOL COMPLETE) TABS Take by mouth.    . Magnesium 400 MG TABS Take 400 mg by  mouth daily. 30 tablet 5  . Multiple Vitamins-Minerals (MULTIVITAMIN WOMEN 50+ PO) Take by mouth.    . potassium chloride SA (KLOR-CON) 20 MEQ tablet Take 1 tablet (20 mEq total) by mouth daily. 30 tablet 5  . rOPINIRole (REQUIP) 1 MG tablet Take 1 tablet (1 mg total) by mouth at bedtime. 30 tablet 5   No current facility-administered medications for this visit.    Allergies  Allergen Reactions  . Lisinopril Cough         Social History    Socioeconomic History  . Marital status: Married    Spouse name: Not on file  . Number of children: 0  . Years of education: Not on file  . Highest education level: Not on file  Occupational History  . Occupation: Airline pilot  Tobacco Use  . Smoking status: Current Every Day Smoker    Types: E-cigarettes  . Smokeless tobacco: Never Used  . Tobacco comment: vaping all day  Vaping Use  . Vaping Use: Every day  . Start date: 10/27/2013  Substance and Sexual Activity  . Alcohol use: Never  . Drug use: Never  . Sexual activity: Not on file  Other Topics Concern  . Not on file  Social History Narrative  . Not on file   Social Determinants of Health   Financial Resource Strain: Not on file  Food Insecurity: Not on file  Transportation Needs: Not on file  Physical Activity: Not on file  Stress: Not on file  Social Connections: Not on file  Intimate Partner Violence: Not on file    Family History  Problem Relation Age of Onset  . Stroke Father   . Alzheimer's disease Mother     Review of Systems:  As stated in the HPI and otherwise negative.   BP 120/74   Pulse 73   Ht 5\' 1"  (1.549 m)   Wt 140 lb 3.2 oz (63.6 kg)   SpO2 98%   BMI 26.49 kg/m   Physical Examination: General: Well developed, well nourished, NAD  HEENT: OP clear, mucus membranes moist  SKIN: warm, dry. No rashes. Neuro: No focal deficits  Musculoskeletal: Muscle strength 5/5 all ext  Psychiatric: Mood and affect normal  Neck: No JVD, no carotid bruits, no thyromegaly, no lymphadenopathy.  Lungs:Clear bilaterally, no wheezes, rhonci, crackles Cardiovascular: Regular rate and rhythm. Loud, harsh, late peaking systolic murmur.  Abdomen:Soft. Bowel sounds present. Non-tender.  Extremities: No lower extremity edema. Pulses are 2 + in the bilateral DP/PT.  Echo 07/12/20: 1. Left ventricular ejection fraction, by estimation, is 60 to 65%. The  left ventricle has normal function. The left ventricle has  no regional  wall motion abnormalities. There is moderate left ventricular hypertrophy.  Left ventricular diastolic  parameters are consistent with Grade II diastolic dysfunction  (pseudonormalization). Elevated left atrial pressure.  2. Right ventricular systolic function is normal. The right ventricular  size is normal. There is moderately elevated pulmonary artery systolic  pressure.  3. Left atrial size was severely dilated.  4. The mitral valve is normal in structure. Mild mitral valve  regurgitation. No evidence of mitral stenosis.  5. The aortic valve has an indeterminant number of cusps. Aortic valve  regurgitation is not visualized. Severe aortic valve stenosis.  6. The inferior vena cava is normal in size with greater than 50%  respiratory variability, suggesting right atrial pressure of 3 mmHg.   FINDINGS  Left Ventricle: Left ventricular ejection fraction, by estimation, is 60  to 65%. The  left ventricle has normal function. The left ventricle has no  regional wall motion abnormalities. The left ventricular internal cavity  size was normal in size. There is  moderate left ventricular hypertrophy. Left ventricular diastolic  parameters are consistent with Grade II diastolic dysfunction  (pseudonormalization). Elevated left atrial pressure.   Right Ventricle: The right ventricular size is normal. Right ventricular  systolic function is normal. There is moderately elevated pulmonary artery  systolic pressure. The tricuspid regurgitant velocity is 3.39 m/s, and  with an assumed right atrial  pressure of 3 mmHg, the estimated right ventricular systolic pressure is  49.0 mmHg.   Left Atrium: Left atrial size was severely dilated.   Right Atrium: Right atrial size was normal in size.   Pericardium: There is no evidence of pericardial effusion.   Mitral Valve: The mitral valve is normal in structure. Mild mitral annular  calcification. Mild mitral valve  regurgitation. No evidence of mitral  valve stenosis.   Tricuspid Valve: The tricuspid valve is normal in structure. Tricuspid  valve regurgitation is mild . No evidence of tricuspid stenosis.   Aortic Valve: The aortic valve has an indeterminant number of cusps.  Aortic valve regurgitation is not visualized. Severe aortic stenosis is  present. Aortic valve mean gradient measures 41.0 mmHg. Aortic valve peak  gradient measures 65.2 mmHg. Aortic  valve area, by VTI measures 0.49 cm.   Pulmonic Valve: The pulmonic valve was normal in structure. Pulmonic valve  regurgitation is mild. No evidence of pulmonic stenosis.   Aorta: The aortic root is normal in size and structure.   Venous: The inferior vena cava is normal in size with greater than 50%  respiratory variability, suggesting right atrial pressure of 3 mmHg.   IAS/Shunts: No atrial level shunt detected by color flow Doppler.   Additional Comments: A pacer wire is visualized.     LEFT VENTRICLE  PLAX 2D  LVIDd:     4.20 cm Diastology  LVIDs:     2.90 cm LV e' medial:  3.70 cm/s  LV PW:     1.50 cm LV E/e' medial: 35.7  LV IVS:    1.40 cm LV e' lateral:  9.79 cm/s  LVOT diam:   1.90 cm LV E/e' lateral: 13.5  LV SV:     51  LV SV Index:  32  LVOT Area:   2.84 cm     RIGHT VENTRICLE       IVC  RV S prime:   13.80 cm/s IVC diam: 1.70 cm  TAPSE (M-mode): 2.4 cm  RVSP:      49.0 mmHg   LEFT ATRIUM       Index    RIGHT ATRIUM      Index  LA diam:    4.50 cm 2.79 cm/m RA Pressure: 3.00 mmHg  LA Vol (A2C):  72.6 ml 45.09 ml/m RA Area:   18.30 cm  LA Vol (A4C):  68.7 ml 42.67 ml/m RA Volume:  51.10 ml 31.74 ml/m  LA Biplane Vol: 70.6 ml 43.85 ml/m  AORTIC VALVE  AV Area (Vmax):  0.51 cm  AV Area (Vmean):  0.47 cm  AV Area (VTI):   0.49 cm  AV Vmax:      403.67 cm/s  AV Vmean:     304.667 cm/s  AV VTI:      1.057 m   AV Peak Grad:   65.2 mmHg  AV Mean Grad:   41.0 mmHg  LVOT Vmax:  73.10 cm/s  LVOT Vmean:    50.200 cm/s  LVOT VTI:     0.181 m  LVOT/AV VTI ratio: 0.17    AORTA  Ao Root diam: 3.30 cm  Ao Asc diam: 3.50 cm   MV E velocity: 132.00 cm/s TRICUSPID VALVE  MV A velocity: 69.40 cm/s  TR Peak grad:  46.0 mmHg  MV E/A ratio: 1.90     TR Vmax:    339.00 cm/s               Estimated RAP: 3.00 mmHg               RVSP:      49.0 mmHg                 SHUNTS               Systemic VTI: 0.18 m               Systemic Diam: 1.90 cm   Recent Labs: 10/15/2019: B Natriuretic Peptide 1,346.3 10/16/2019: Magnesium 1.7 12/29/2019: Hemoglobin 14.1; Platelets 207 04/28/2020: ALT 23; BUN 21; Creatinine, Ser 0.74; Potassium 5.0; Sodium 143; TSH 1.349   Lipid Panel    Component Value Date/Time   CHOL 139 11/13/2019 1136   TRIG 113 11/13/2019 1136   HDL 44 11/13/2019 1136   CHOLHDL 3.2 11/13/2019 1136   LDLCALC 74 11/13/2019 1136     Wt Readings from Last 3 Encounters:  07/28/20 140 lb 3.2 oz (63.6 kg)  07/12/20 145 lb 9.6 oz (66 kg)  05/24/20 137 lb 6.4 oz (62.3 kg)     Other studies Reviewed: Additional studies/ records that were reviewed today include: echo images, office notes.  Review of the above records demonstrates: severe AS   Assessment and Plan:   1. Severe Aortic Valve Stenosis: She has severe, stage D aortic valve stenosis. I have personally reviewed the echo images. The aortic valve is thickened, calcified with limited leaflet mobility. I think she would benefit from AVR. Given advanced age, she is not a good candidate for conventional AVR by surgical approach. I think she may be a good candidate for TAVR. She has some memory issues post CVA but overall is very functional.   STS Risk Score: Risk of Mortality: 2.249% Renal Failure: 1.097% Permanent  Stroke: 1.127% Prolonged Ventilation: 6.483% DSW Infection: 0.081% Reoperation: 3.304% Morbidity or Mortality: 9.924% Short Length of Stay: 33.962% Long Length of Stay: 5.331%   I have reviewed the natural history of aortic stenosis with the patient and their family members  who are present today. We have discussed the limitations of medical therapy and the poor prognosis associated with symptomatic aortic stenosis. We have reviewed potential treatment options, including palliative medical therapy, conventional surgical aortic valve replacement, and transcatheter aortic valve replacement. We discussed treatment options in the context of the patient's specific comorbid medical conditions.   She would like to proceed with planning for TAVR. We will attempt to get her cath films from [facility] for review. Risks and benefits of the valve procedure are reviewed with the patient. We will arrange a cardiac CT, CTA of the chest/abdomen and pelvis, carotid artery dopplers, PT assessment and she will then be referred to see one of the CT surgeons on our TAVR team. BMET today.      Current medicines are reviewed at length with the patient today.  The patient does not have concerns regarding medicines.  The following changes have been  made:  no change  Labs/ tests ordered today include:   Orders Placed This Encounter  Procedures  . Basic metabolic panel     Disposition:   FU with the valve team.    Signed, Verne Carrow, MD 07/28/2020 10:24 AM    [facility] [address] [place], [place], [place]  [address] Phone: [phone]; Fax: [phone]

## 2020-07-29 ENCOUNTER — Other Ambulatory Visit: Payer: Self-pay | Admitting: Physician Assistant

## 2020-07-29 ENCOUNTER — Encounter: Payer: Self-pay | Admitting: Physician Assistant

## 2020-07-29 DIAGNOSIS — I350 Nonrheumatic aortic (valve) stenosis: Secondary | ICD-10-CM

## 2020-08-04 ENCOUNTER — Ambulatory Visit (HOSPITAL_COMMUNITY): Payer: Medicare (Managed Care)

## 2020-08-04 ENCOUNTER — Ambulatory Visit (HOSPITAL_COMMUNITY): Admission: RE | Admit: 2020-08-04 | Payer: Medicare (Managed Care) | Source: Ambulatory Visit

## 2020-08-05 ENCOUNTER — Encounter: Payer: Medicare (Managed Care) | Admitting: Surgery

## 2020-08-12 ENCOUNTER — Ambulatory Visit
Admission: RE | Admit: 2020-08-12 | Discharge: 2020-08-12 | Disposition: A | Discharge status: Home / Self Care | Payer: Self-pay | Source: Ambulatory Visit | Attending: Cardiovascular Disease | Admitting: Cardiovascular Disease

## 2020-08-12 ENCOUNTER — Other Ambulatory Visit: Payer: Self-pay

## 2020-08-12 DIAGNOSIS — I350 Nonrheumatic aortic (valve) stenosis: Secondary | ICD-10-CM

## 2020-08-19 ENCOUNTER — Ambulatory Visit (HOSPITAL_COMMUNITY): Payer: Medicare (Managed Care)

## 2020-08-23 NOTE — Progress Notes (Signed)
Primary Care Physician: [name], [name], MD Primary Cardiologist: Dr Elease Hashimoto Primary Electrophysiologist: Dr Graciela Husbands Referring Physician: Dr Klein   Kaitlyn [name] is a 80 y.o. female with a history of CVA (while off a/c 2/2 GIB), AFib, remote DVT, HTN, SSSx w/PPM, AS who presents for follow up in the [facility] Atrial Fibrillation Clinic.  Patient was admitted to [facility] 10/15/19 with increasing DOE, SOB, edema, noted with CHF. During her hospitalization, it was noted that her PPM has reached ERI and she underwent a gen change at that time. Patient does have a history of stroke while off anticoagulation. 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 Patient is on Eliquis for a CHADS2VASC score of 7. The device clinic received an alert for persistent afib on 04/15/20 with RVR. Patient reports she was unaware of her afib. There were no triggers that she could identify.   On follow up today, patient reports she has done well since her last visit. She denies any heart racing or palpitations. Her afib burden on device is low at <0.1%. She denies any bleeding issues on anticoagulation. She has seen Dr Clifton James and is starting the workup for TAVR.  Today, she denies symptoms of palpitations, chest pain, shortness of breath, orthopnea, PND, lower extremity edema, dizziness, presyncope, syncope, snoring, daytime somnolence, bleeding. The patient is tolerating medications without difficulties and is otherwise without complaint today.    Atrial Fibrillation Risk Factors:  she does not have symptoms or diagnosis of sleep apnea. she does not have a history of rheumatic fever.   she has a BMI of Body mass index is 24.53 kg/m.Marland Kitchen Filed Weights   08/24/20 1128  Weight: 58.9 kg    Family History  Problem Relation Age of Onset  . Stroke Father   . Alzheimer's disease Mother      Atrial Fibrillation Management history:  Previous antiarrhythmic  drugs: propafenone, amiodarone  Previous cardioversions: none Previous ablations: none CHADS2VASC score: 7 Anticoagulation history: Eliquis   Past Medical History:  Diagnosis Date  . A-fib (HCC)   . Aortic stenosis   . Arthritis    both knees  . Chronic pain   . Presence of permanent cardiac pacemaker   . Sick sinus syndrome (HCC)   . Stroke [facility])    Past Surgical History:  Procedure Laterality Date  . CERVICAL SPINE SURGERY    . PACEMAKER IMPLANT    . PPM GENERATOR CHANGEOUT N/A 10/17/2019   Procedure: PPM GENERATOR CHANGEOUT;  Surgeon: Duke Salvia, MD;  Location: [facility] INVASIVE CV LAB;  Service: Cardiovascular;  Laterality: N/A;  . shoulder replacement Left     Current Outpatient Medications  Medication Sig Dispense Refill  . albuterol (VENTOLIN HFA) 108 (90 Base) MCG/ACT inhaler Inhale 2 puffs into the lungs every 6 (six) hours as needed for wheezing or shortness of breath. 8 g 2  . amiodarone (PACERONE) 200 MG tablet Take Amiodarone 200mg  daily except for Sundays. 90 tablet 3  . apixaban (ELIQUIS) 5 MG TABS tablet Take 1 tablet (5 mg total) by mouth 2 (two) times daily. 60 tablet 5  . furosemide (LASIX) 20 MG tablet Take 1 tablet (20 mg total) by mouth in the morning. 30 tablet 5  . Iron-Vitamins (GERITOL COMPLETE) TABS Take by mouth.    . Magnesium 400 MG TABS Take 400 mg by mouth daily. 30 tablet 5  . Multiple Vitamins-Minerals (MULTIVITAMIN WOMEN 50+ PO) Take by mouth.    09-10-1971  potassium chloride SA (KLOR-CON) 20 MEQ tablet Take 1 tablet (20 mEq total) by mouth daily. 30 tablet 5  . rOPINIRole (REQUIP) 1 MG tablet Take 1 tablet (1 mg total) by mouth at bedtime. 30 tablet 5   No current facility-administered medications for this encounter.    Allergies  Allergen Reactions  . Lisinopril Cough         Social History   Socioeconomic History  . Marital status: Married    Spouse name: Not on file  . Number of children: 0  . Years of education: Not on file  .  Highest education level: Not on file  Occupational History  . Occupation: Airline pilot  Tobacco Use  . Smoking status: Current Every Day Smoker    Types: E-cigarettes  . Smokeless tobacco: Never Used  . Tobacco comment: vaping all day  Vaping Use  . Vaping Use: Every day  . Start date: 10/27/2013  Substance and Sexual Activity  . Alcohol use: Never  . Drug use: Never  . Sexual activity: Not on file  Other Topics Concern  . Not on file  Social History Narrative  . Not on file   Social Determinants of Health   Financial Resource Strain: Not on file  Food Insecurity: Not on file  Transportation Needs: Not on file  Physical Activity: Not on file  Stress: Not on file  Social Connections: Not on file  Intimate Partner Violence: Not on file     ROS- All systems are reviewed and negative except as per the HPI above.  Physical Exam: Vitals:   08/24/20 1128  BP: 132/80  Pulse: 62  Weight: 58.9 kg  Height: 5\' 1"  (1.549 m)    GEN- The patient is a well appearing elderly female, alert and oriented x 3 today.   HEENT-head normocephalic, atraumatic, sclera clear, conjunctiva pink, hearing intact, trachea midline. Lungs- Clear to ausculation bilaterally, normal work of breathing Heart- Regular rate and rhythm, no rubs or gallops. 3/6 systolic murmur  GI- soft, NT, ND, + BS Extremities- no clubbing, cyanosis, or edema MS- no significant deformity or atrophy Skin- no rash or lesion Psych- euthymic mood, full affect Neuro- strength and sensation are intact   Wt Readings from Last 3 Encounters:  08/24/20 58.9 kg  07/28/20 63.6 kg  07/12/20 66 kg    EKG today demonstrates  A paced rhythm LAFB, LVH Vent. rate 62 BPM PR interval 188 ms QRS duration 102 ms QT/QTcB 458/464 ms  Echo 10/16/19 demonstrated  1. Left ventricular ejection fraction, by estimation, is 50%. The left  ventricle has mildly decreased function. The left ventricle demonstrates  global hypokinesis. There is  mild left ventricular hypertrophy. Left  ventricular diastolic parameters are  indeterminate. Elevated left ventricular end-diastolic pressure.  2. Right ventricular systolic function is normal. The right ventricular  size is normal. There is mildly elevated pulmonary artery systolic  pressure. The estimated right ventricular systolic pressure is 40.2 mmHg.  3. Left atrial size was moderately dilated.  4. Right atrial size was mildly dilated.  5. The mitral valve is abnormal. Mild to moderate mitral valve  regurgitation. No evidence of mitral stenosis.  6. Tricuspid valve regurgitation is moderate.  7. The aortic valve is severely calcified with severely reduced leaflet  motion. The dimensionless index is 0.17, however LVOT TVI may be  underestimated. Overall findings suggest at least moderate-severe aortic valve stenosis. The aortic valve is abnormal. Aortic valve regurgitation is not visualized. Aortic valve mean gradient measures 21.0  mmHg.  8. The inferior vena cava is dilated in size with <50% respiratory  variability, suggesting right atrial pressure of 15 mmHg.  Epic records are reviewed at length today  CHA2DS2-VASc Score = 7  The patient's score is based upon: CHF History: Yes HTN History: Yes Diabetes History: No Stroke History: Yes Vascular Disease History: No Age Score: 2 Gender Score: 1      ASSESSMENT AND PLAN: 1. Persistent Atrial Fibrillation (ICD10:  I48.19) The patient's CHA2DS2-VASc score is 7, indicating a 11.2% annual risk of stroke.   Patient appears to be maintaining SR with <0.1% afib burden. Decrease amiodarone to 100 mg daily Continue Eliquis 5 mg BID Continue Toprol 50 mg daily  2. Secondary Hypercoagulable State (ICD10:  D68.69) The patient is at significant risk for stroke/thromboembolism based upon her CHA2DS2-VASc Score of 7.  Continue Apixaban (Eliquis).   3. SSS S/p PPM, followed by Dr Graciela Husbands and the device clinic.  4. HTN Stable,  no changes today.  5. Severe aortic stenosis Being seen by structural heart team for possible TAVR.   Follow up with Dr Graciela Husbands or EP APP. AF clinic in 6 months.    Jorja Loa PA-C Afib Clinic [facility] [address] [place], [place] [address] [phone] 08/24/2020 11:46 AM

## 2020-08-24 ENCOUNTER — Ambulatory Visit (HOSPITAL_COMMUNITY)
Admission: RE | Admit: 2020-08-24 | Discharge: 2020-08-24 | Disposition: A | Discharge status: Home / Self Care | Payer: Medicare (Managed Care) | Source: Ambulatory Visit | Attending: Physician Assistant | Admitting: Physician Assistant

## 2020-08-24 ENCOUNTER — Other Ambulatory Visit: Payer: Self-pay

## 2020-08-24 ENCOUNTER — Encounter (HOSPITAL_COMMUNITY): Payer: Self-pay | Admitting: Physician Assistant

## 2020-08-24 VITALS — BP 132/80 | HR 62 | Ht 61.0 in | Wt 129.8 lb

## 2020-08-24 DIAGNOSIS — Z7901 Long term (current) use of anticoagulants: Secondary | ICD-10-CM | POA: Diagnosis not present

## 2020-08-24 DIAGNOSIS — I495 Sick sinus syndrome: Secondary | ICD-10-CM | POA: Diagnosis not present

## 2020-08-24 DIAGNOSIS — F17290 Nicotine dependence, other tobacco product, uncomplicated: Secondary | ICD-10-CM | POA: Insufficient documentation

## 2020-08-24 DIAGNOSIS — D6869 Other thrombophilia: Secondary | ICD-10-CM | POA: Diagnosis not present

## 2020-08-24 DIAGNOSIS — Z823 Family history of stroke: Secondary | ICD-10-CM | POA: Diagnosis not present

## 2020-08-24 DIAGNOSIS — Z79899 Other long term (current) drug therapy: Secondary | ICD-10-CM | POA: Insufficient documentation

## 2020-08-24 DIAGNOSIS — I4819 Other persistent atrial fibrillation: Secondary | ICD-10-CM | POA: Diagnosis not present

## 2020-08-24 DIAGNOSIS — I350 Nonrheumatic aortic (valve) stenosis: Secondary | ICD-10-CM | POA: Diagnosis not present

## 2020-08-24 DIAGNOSIS — Z8673 Personal history of transient ischemic attack (TIA), and cerebral infarction without residual deficits: Secondary | ICD-10-CM | POA: Insufficient documentation

## 2020-08-24 DIAGNOSIS — Z86718 Personal history of other venous thrombosis and embolism: Secondary | ICD-10-CM | POA: Insufficient documentation

## 2020-08-24 DIAGNOSIS — I10 Essential (primary) hypertension: Secondary | ICD-10-CM | POA: Diagnosis not present

## 2020-08-24 DIAGNOSIS — Z96612 Presence of left artificial shoulder joint: Secondary | ICD-10-CM | POA: Diagnosis not present

## 2020-08-24 DIAGNOSIS — Z950 Presence of cardiac pacemaker: Secondary | ICD-10-CM | POA: Insufficient documentation

## 2020-08-24 MED ORDER — AMIODARONE HCL 200 MG PO TABS: 100.0000 mg | ORAL_TABLET | Freq: Every day | ORAL | 3 refills | Status: DC

## 2020-08-24 NOTE — Patient Instructions (Signed)
Decrease Amiodarone to 100mg  every day (1/2 of your 200mg  tablet)

## 2020-08-27 ENCOUNTER — Other Ambulatory Visit: Payer: Self-pay

## 2020-08-27 DIAGNOSIS — I350 Nonrheumatic aortic (valve) stenosis: Secondary | ICD-10-CM

## 2020-08-31 ENCOUNTER — Other Ambulatory Visit: Payer: Medicare Other

## 2020-08-31 ENCOUNTER — Other Ambulatory Visit: Payer: Self-pay

## 2020-08-31 DIAGNOSIS — I350 Nonrheumatic aortic (valve) stenosis: Secondary | ICD-10-CM

## 2020-08-31 LAB — BASIC METABOLIC PANEL
BUN/Creatinine Ratio: 24 (ref 12–28)
BUN: 21 mg/dL (ref 8–27)
CO2: 25 mmol/L (ref 20–29)
Calcium: 10.1 mg/dL (ref 8.7–10.3)
Chloride: 99 mmol/L (ref 96–106)
Creatinine, Ser: 0.87 mg/dL (ref 0.57–1.00)
Glucose: 89 mg/dL (ref 65–99)
Potassium: 3.9 mmol/L (ref 3.5–5.2)
Sodium: 142 mmol/L (ref 134–144)
eGFR: 68 mL/min/{1.73_m2} (ref >59)

## 2020-09-07 ENCOUNTER — Encounter (HOSPITAL_COMMUNITY): Payer: Self-pay

## 2020-09-07 ENCOUNTER — Ambulatory Visit (HOSPITAL_COMMUNITY)
Admission: RE | Admit: 2020-09-07 | Discharge: 2020-09-07 | Disposition: A | Discharge status: Home / Self Care | Payer: Medicare Other | Source: Ambulatory Visit | Attending: Physician Assistant | Admitting: Physician Assistant

## 2020-09-07 ENCOUNTER — Telehealth: Payer: Self-pay | Admitting: Physician Assistant

## 2020-09-07 ENCOUNTER — Other Ambulatory Visit: Payer: Self-pay

## 2020-09-07 DIAGNOSIS — I350 Nonrheumatic aortic (valve) stenosis: Secondary | ICD-10-CM

## 2020-09-07 DIAGNOSIS — E041 Nontoxic single thyroid nodule: Secondary | ICD-10-CM

## 2020-09-07 MED ORDER — IOHEXOL 350 MG/ML SOLN
100.0000 mL | Freq: Once | INTRAVENOUS | Status: AC | PRN
  Administered 2020-09-07: 100 mL via INTRAVENOUS

## 2020-09-07 NOTE — Telephone Encounter (Signed)
Thyroid ultrasound ordered for scheduling.

## 2020-09-07 NOTE — Telephone Encounter (Signed)
That is fine. Can we get that set up for the pt?

## 2020-09-07 NOTE — Telephone Encounter (Signed)
Informed the patient of thyroid nodule seen on carotid US. She understands she will be called to schedule thyroid US.

## 2020-09-07 NOTE — Telephone Encounter (Signed)
Fleet Contras, Vascular Sonographer who did the patient's Carotid called. The Sonographer noticed a thyroid nodule with rim calcification on it. The Sonographer mentioned that it can normally be benign, but sometimes it can be more suspicious.   She recommends the patient have a general thyroid scan to further evaluate the nodule.  Please call her if necessary

## 2020-09-07 NOTE — Progress Notes (Signed)
Carotid duplex bilateral study completed.  Preliminary results relayed to [facility] for [place], [place].   See CV Proc for preliminary results report.   Jean Rosenthal, RDMS, RVT

## 2020-09-07 NOTE — Telephone Encounter (Signed)
Thank you :)

## 2020-09-15 ENCOUNTER — Ambulatory Visit
Admission: RE | Admit: 2020-09-15 | Discharge: 2020-09-15 | Disposition: A | Discharge status: Home / Self Care | Payer: Medicare Other | Source: Ambulatory Visit | Attending: Physician Assistant | Admitting: Physician Assistant

## 2020-09-15 ENCOUNTER — Other Ambulatory Visit: Payer: Self-pay

## 2020-09-15 DIAGNOSIS — E041 Nontoxic single thyroid nodule: Secondary | ICD-10-CM

## 2020-09-29 ENCOUNTER — Ambulatory Visit: Payer: Medicare Other | Attending: Physician Assistant | Admitting: Physical Therapy

## 2020-09-29 ENCOUNTER — Other Ambulatory Visit: Payer: Self-pay

## 2020-09-29 ENCOUNTER — Encounter: Payer: Self-pay | Admitting: Physical Therapy

## 2020-09-29 ENCOUNTER — Institutional Professional Consult (permissible substitution) (INDEPENDENT_AMBULATORY_CARE_PROVIDER_SITE_OTHER): Payer: Medicare Other | Admitting: Surgery

## 2020-09-29 ENCOUNTER — Encounter: Payer: Self-pay | Admitting: Surgery

## 2020-09-29 VITALS — BP 113/73 | HR 82 | Resp 20 | Ht 61.0 in

## 2020-09-29 DIAGNOSIS — R2689 Other abnormalities of gait and mobility: Secondary | ICD-10-CM | POA: Insufficient documentation

## 2020-09-29 DIAGNOSIS — I350 Nonrheumatic aortic (valve) stenosis: Secondary | ICD-10-CM | POA: Diagnosis not present

## 2020-09-29 NOTE — Progress Notes (Signed)
Patient ID: Kaitlyn [name], female   DOB: [birth year], 80 y.o.   MRN: [id number]  [facility]  MULTIDISCIPLINARY HEART VALVE CLINIC  CARDIOTHORACIC SURGERY CONSULTATION REPORT  Referring Provider is Nahser, Deloris Ping, MD Primary Cardiologist is [name], MD PCP is [name], [name], MD  Chief Complaint  Patient presents with  . 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  She suffered a stroke in January 2021 resulting in dysarthria and confusion.  MRI showed a chronic hemorrhagic infarct in the right frontal lobe with surrounding subacute infarct.  She also had problems with acute hepatic encephalopathy at that time with elevated ammonia level.  2D echocardiogram in May 2021 showed a severely calcified aortic valve with severely restricted mobility.  The mean gradient was 21 mmHg with a peak gradient of 32 mmHg.  Aortic valve area by VTI was 0.48 cm.  Dimensionless index was 0.17.  Left ventricular ejection fraction was mildly decreased at 50% with global hypokinesis.  There was mild LVH.  She was admitted to [facility] in September 2021 with shortness of breath and found to be in atrial fibrillation with rapid ventricular response.  Echocardiogram at that time showed ejection fraction of 35 to 40%.  She was transferred to [facility] and had an echo on 02/09/2020 showing severely thickened aortic valve leaflets with severely restricted leaflet mobility.  The mean gradient was measured at 29 mmHg with an aortic valve area of 0.55 cm.  Ejection fraction was 55 to 60% at that time.  There was no evidence of  left atrial appendage thrombus and she was cardioverted to sinus rhythm.  She underwent cardiac catheterization in September 2021 that showed no evidence of coronary disease.  She now presents with a history of progressive exertional fatigue and shortness of breath since around January.  She has had some lower extremity edema bilaterally.  She reports orthopnea.  She has had no dizziness or syncope.  A follow-up echo on 07/12/2020 showed severe aortic stenosis with a mean gradient of 41 mmHg and a peak gradient of 65.2 mmHg.  Aortic valve area by VTI was 0.49 cm.  Left ventricular ejection fraction was 60 to 65% with moderate LVH and grade 2 diastolic dysfunction.  Carotid Doppler examination showed no significant carotid disease.  She is here today with her husband.  She lives in [place].  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  She now has significant problems with her memory and confusion.  She smokes cigarettes for about 50 years but has stopped and now uses e-cigarettes.  She sees her dentist regularly.  Past Medical History:  Diagnosis Date  . A-fib (HCC)   . Aortic stenosis   . Arthritis    both knees  . Chronic pain   . Presence of permanent cardiac pacemaker   . Sick sinus syndrome (HCC)   . Stroke [facility])     Past Surgical History:  Procedure Laterality Date  . CERVICAL SPINE SURGERY    . PACEMAKER IMPLANT    . PPM GENERATOR CHANGEOUT N/A 10/17/2019   Procedure: PPM GENERATOR CHANGEOUT;  Surgeon: Duke Salvia, MD;  Location: [facility] INVASIVE CV LAB;  Service: Cardiovascular;  Laterality: N/A;  . shoulder replacement Left     Family History  Problem Relation Age of Onset  . Stroke Father   . Alzheimer's disease Mother     Social History   Socioeconomic History  . Marital status: Married    Spouse name: Not on file  . Number of children: 0  . Years of education: Not on file  . Highest education level: Not on file   Occupational History  . Occupation: Airline pilot  Tobacco Use  . Smoking status: Current Every Day Smoker    Types: E-cigarettes  . Smokeless tobacco: Never Used  . Tobacco comment: vaping all day  Vaping Use  . Vaping Use: Every day  . Start date: 10/27/2013  Substance and Sexual Activity  . Alcohol use: Never  . Drug use: Never  . Sexual activity: Not on file  Other Topics Concern  . Not on file  Social History Narrative  . Not on file   Social Determinants of Health   Financial Resource Strain: Not on file  Food Insecurity: Not on file  Transportation Needs: Not on file  Physical Activity: Not on file  Stress: Not on file  Social Connections: Not on file  Intimate Partner Violence: Not on file    Current Outpatient Medications  Medication Sig Dispense Refill  . albuterol (VENTOLIN HFA) 108 (90 Base) MCG/ACT inhaler Inhale 2 puffs into the lungs every 6 (six) hours as needed for wheezing or shortness of breath. 8 [name] 2  . amiodarone (PACERONE) 200 MG tablet Take 0.5 tablets (100 mg total) by mouth daily. 90 tablet 3  . apixaban (ELIQUIS) 5 MG TABS tablet Take 1 tablet (5 mg total) by mouth 2 (two) times daily. 60 tablet 5  . furosemide (LASIX) 20 MG tablet Take 1 tablet (20 mg total) by mouth in the morning. 30 tablet 5  . Iron-Vitamins (GERITOL COMPLETE) TABS Take by mouth.    . Magnesium 400 MG TABS Take 400 mg by mouth daily. 30 tablet 5  . Multiple Vitamins-Minerals (MULTIVITAMIN WOMEN 50+ PO) Take by mouth.    . potassium chloride SA (KLOR-CON) 20 MEQ tablet Take 1 tablet (20 mEq total) by mouth daily. 30 tablet 5  . rOPINIRole (REQUIP) 1 MG tablet Take 1 tablet (1 mg total) by mouth at bedtime. 30 tablet 5   No current facility-administered medications for this visit.    Allergies  Allergen Reactions  . Lisinopril Cough           Review of Systems:   General:  normal appetite but husband says she eats like a bird, + decreased energy, no weight gain, +  weight loss since on diet, no fever  Cardiac:  no chest pain with exertion, no chest pain at rest, +SOB with mild exertion, + resting SOB if standing for a while, no PND, + orthopnea, + palpitations, + arrhythmia, + atrial fibrillation, no LE edema, no dizzy spells, no syncope  Respiratory:  + shortness of breath, no home oxygen, no productive cough, no dry cough, no bronchitis, no wheezing, no hemoptysis, no asthma, no pain with inspiration or cough, + sleep apnea, + CPAP at night  GI:   no difficulty swallowing, no reflux, no frequent heartburn, no hiatal hernia, no abdominal pain, no constipation, no diarrhea, no hematochezia, no hematemesis, no melena  GU:   no dysuria,  no frequency, no urinary tract infection, no  hematuria, no kidney stones, no kidney disease  Vascular:  no pain suggestive of claudication, no pain in feet, no leg cramps, no varicose veins, no DVT, no non-healing foot ulcer  Neuro:   + stroke, no TIA's, no seizures, no headaches, no temporary blindness one eye,  no slurred speech, no peripheral neuropathy, no chronic pain, no instability of gait, + memory/cognitive dysfunction  Musculoskeletal: + arthritis, + joint swelling, no myalgias, no difficulty walking, + mildly decreased mobility   Skin:   no rash, no itching, no skin infections, no pressure sores or ulcerations  Psych:   no anxiety, no depression, no nervousness, no unusual recent stress  Eyes:   no blurry vision, no floaters, no recent vision changes, does not wear glasses or contacts  ENT:   no hearing loss, no loose or painful teeth, no dentures, last saw dentist this year  Hematologic:  no easy bruising, no abnormal bleeding, no clotting disorder, no frequent epistaxis  Endocrine:  no diabetes, does not check CBG's at home      Physical Exam:   BP 113/73 (BP Location: Left Arm, Patient Position: Sitting)   Pulse 82   Resp 20   Ht 5\' 1"  (1.549 m)   SpO2 94% Comment: RA  BMI 24.53 kg/m   General:  Elderly,   well-appearing  HEENT:  Unremarkable, NCAT, PERLA, EOMI  Neck:   no JVD, no bruits, no adenopathy   Chest:   clear to auscultation, symmetrical breath sounds, no wheezes, no rhonchi   CV:   RRR, grade lll/VI crescendo/decrescendo murmur heard best at RSB,  no diastolic murmur  Abdomen:  soft, non-tender, no masses   Extremities:  warm, well-perfused, pulses palpable at ankle, no LE edema  Rectal/GU  Deferred  Neuro:   Grossly non-focal and symmetrical throughout, mentation seems good.  Skin:   Clean and dry, no rashes, no breakdown   Diagnostic Tests:   ECHOCARDIOGRAM REPORT       Patient Name:  Kaitlyn [name] Date of Exam: 07/12/2020  Medical Rec #: 07/14/2020   Height:    61.0 in  Accession #:  [id number]  Weight:    137.4 lb  Date of Birth: [birth year]   BSA:     1.610 m  Patient Age:  79 years   BP:      128/84 mmHg  Patient Gender: F       HR:      65 bpm.  Exam Location: [address]   Procedure: 2D Echo, Cardiac Doppler and Color Doppler   Indications:  I35.0 AS    History:    Patient has prior history of Echocardiogram examinations,  most         recent 10/16/2019. Pacemaker, Stroke, AS, Arrythmias:Atrial         Fibrillation; Risk Factors:Current Smoker and  Hypertension.    Sonographer:  10/18/2019 RDCS  Referring Phys: Samule Ohm RENEE LYNN URSUY   IMPRESSIONS    1. Left ventricular ejection fraction, by estimation, is 60 to 65%. The  left ventricle has normal function. The left ventricle has no regional  wall motion abnormalities. There is moderate left ventricular hypertrophy.  Left ventricular diastolic  parameters are consistent with Grade II diastolic dysfunction  (pseudonormalization). Elevated left atrial pressure.  2. Right ventricular systolic function is normal. The right ventricular  size is normal. There is moderately elevated pulmonary artery systolic  pressure.  3.  Left atrial size was severely dilated.  4. The mitral valve is  normal in structure. Mild mitral valve  regurgitation. No evidence of mitral stenosis.  5. The aortic valve has an indeterminant number of cusps. Aortic valve  regurgitation is not visualized. Severe aortic valve stenosis.  6. The inferior vena cava is normal in size with greater than 50%  respiratory variability, suggesting right atrial pressure of 3 mmHg.   FINDINGS  Left Ventricle: Left ventricular ejection fraction, by estimation, is 60  to 65%. The left ventricle has normal function. The left ventricle has no  regional wall motion abnormalities. The left ventricular internal cavity  size was normal in size. There is  moderate left ventricular hypertrophy. Left ventricular diastolic  parameters are consistent with Grade II diastolic dysfunction  (pseudonormalization). Elevated left atrial pressure.   Right Ventricle: The right ventricular size is normal. Right ventricular  systolic function is normal. There is moderately elevated pulmonary artery  systolic pressure. The tricuspid regurgitant velocity is 3.39 m/s, and  with an assumed right atrial  pressure of 3 mmHg, the estimated right ventricular systolic pressure is  49.0 mmHg.   Left Atrium: Left atrial size was severely dilated.   Right Atrium: Right atrial size was normal in size.   Pericardium: There is no evidence of pericardial effusion.   Mitral Valve: The mitral valve is normal in structure. Mild mitral annular  calcification. Mild mitral valve regurgitation. No evidence of mitral  valve stenosis.   Tricuspid Valve: The tricuspid valve is normal in structure. Tricuspid  valve regurgitation is mild . No evidence of tricuspid stenosis.   Aortic Valve: The aortic valve has an indeterminant number of cusps.  Aortic valve regurgitation is not visualized. Severe aortic stenosis is  present. Aortic valve mean gradient measures 41.0 mmHg. Aortic valve  peak  gradient measures 65.2 mmHg. Aortic  valve area, by VTI measures 0.49 cm.   Pulmonic Valve: The pulmonic valve was normal in structure. Pulmonic valve  regurgitation is mild. No evidence of pulmonic stenosis.   Aorta: The aortic root is normal in size and structure.   Venous: The inferior vena cava is normal in size with greater than 50%  respiratory variability, suggesting right atrial pressure of 3 mmHg.   IAS/Shunts: No atrial level shunt detected by color flow Doppler.   Additional Comments: A pacer wire is visualized.     LEFT VENTRICLE  PLAX 2D  LVIDd:     4.20 cm Diastology  LVIDs:     2.90 cm LV e' medial:  3.70 cm/s  LV PW:     1.50 cm LV E/e' medial: 35.7  LV IVS:    1.40 cm LV e' lateral:  9.79 cm/s  LVOT diam:   1.90 cm LV E/e' lateral: 13.5  LV SV:     51  LV SV Index:  32  LVOT Area:   2.84 cm     RIGHT VENTRICLE       IVC  RV S prime:   13.80 cm/s IVC diam: 1.70 cm  TAPSE (M-mode): 2.4 cm  RVSP:      49.0 mmHg   LEFT ATRIUM       Index    RIGHT ATRIUM      Index  LA diam:    4.50 cm 2.79 cm/m RA Pressure: 3.00 mmHg  LA Vol (A2C):  72.6 ml 45.09 ml/m RA Area:   18.30 cm  LA Vol (A4C):  68.7 ml 42.67 ml/m RA Volume:  51.10 ml 31.74 ml/m  LA Biplane Vol: 70.6 ml 43.85 ml/m  AORTIC VALVE  AV Area (Vmax):  0.51 cm  AV Area (Vmean):  0.47 cm  AV Area (VTI):   0.49 cm  AV Vmax:      403.67 cm/s  AV Vmean:     304.667 cm/s  AV VTI:      1.057 m  AV Peak Grad:   65.2 mmHg  AV Mean Grad:   41.0 mmHg  LVOT Vmax:     73.10 cm/s  LVOT Vmean:    50.200 cm/s  LVOT VTI:     0.181 m  LVOT/AV VTI ratio: 0.17    AORTA  Ao Root diam: 3.30 cm  Ao Asc diam: 3.50 cm   MV E velocity: 132.00 cm/s TRICUSPID VALVE  MV A velocity: 69.40 cm/s  TR Peak grad:  46.0 mmHg  MV E/A ratio: 1.90     TR Vmax:    339.00 cm/s                Estimated RAP: 3.00 mmHg               RVSP:      49.0 mmHg                 SHUNTS               Systemic VTI: 0.18 m               Systemic Diam: 1.90 cm   Olga Millers MD  Electronically signed by Olga Millers MD  Signature Date/Time: 07/12/2020/12:50:10 PM    ADDENDUM REPORT: 09/07/2020 17:26  CLINICAL DATA:  Severe Aortic Stenosis.  EXAM: Cardiac TAVR CT  TECHNIQUE: The patient was scanned on a [employer]. A 100 kV retrospective scan was triggered in the descending thoracic aorta at 111 HU's. Gantry rotation speed was 250 msecs and collimation was .6 mm. No beta blockade or nitro were given. The 3D data set was reconstructed in 5% intervals of the R-R cycle. Systolic and diastolic phases were analyzed on a dedicated work station using MPR, MIP and VRT modes. The patient received 80 cc of contrast.  FINDINGS: Image quality: Excellent.  Noise artifact is: Limited.  Valve Morphology: The aortic valve is bicuspid with fusion of the RCC/LCC. It appears to be a 2 sinus (anterior-posterior) bicuspid aortic valve without raphe. The leaflets are diffusely calcified with severely restricted motion in systole.  Aortic Valve Calcium score: 1883  Aortic annular dimension:  Phase assessed: 160 ms  Annular area: 420 mm2  Annular perimeter: 74.2 mm  Max diameter: 26.4 mm  Min diameter: 20.2 mm  Annular and subannular calcification: None. There is a small diverticulum in the LVOT ~ 1-2 mm below the annulus under the NCC.  Optimal coplanar projection: LAO 23 CAU 19  Coronary Artery Height above Annulus:  Left Main: 12.1 mm  Right Coronary: 24.2 mm  Sinus of Valsalva Measurements:  Commissure to commissure: 28 mm  Max diameter: 36 mm  Sinus of Valsalva Height:  Non-coronary: 28.0 mm  Right-coronary: 20.0 mm  Left-coronary: 18.5  mm  Sinotubular Junction: 28 mm  Ascending Thoracic Aorta: Ectatic up to 40 mm (double-oblique)  Coronary Arteries: Normal coronary origin. Right dominance. The study was performed without use of NTG and is insufficient for plaque evaluation. Please refer to recent cardiac catheterization for coronary assessment.  Cardiac Morphology:  Right Atrium: Right atrial size is within normal limits.  Right Ventricle: The right ventricular cavity is within normal limits.  Left  Atrium: Left atrial size is dilated in size with no left atrial appendage filling defect.  Left Ventricle: The ventricular cavity size is within normal limits. There are no stigmata of prior infarction. There is no abnormal filling defect. Normal left ventricular function, EF 65%. No regional wall motion abnormalities.  Pulmonary arteries: Dilated, suggestive of pulmonary hypertension.  Pulmonary veins: Normal pulmonary venous drainage.  Pericardium: Normal thickness with no significant effusion or calcium present.  Mitral Valve: The mitral valve is normal structure without significant calcification.  Extra-cardiac findings: See attached radiology report for non-cardiac structures.  IMPRESSION: 1. 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 Appears to be a 2 sinus (anterior-posterior) bicuspid aortic valve without raphe.  2. Severe aortic stenosis (calcium score 1883).  3. Annular measurements appropriate for 23 mm Edwards S3 TAVR (420 mm2).  4. No significant annular or subannular calcifications.  5. Small diverticulum in the LVOT 1-2 mm below the annulus under the NCC.  6. Sufficient coronary to annulus distance.  7. Optimal Fluoroscopic Angle for Delivery: LAO 23 CAU 19  8. 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  9. Dilated pulmonary artery suggestive of pulmonary hypertension.  Gerri Spore T. Flora Lipps, MD   Electronically Signed   By: Lennie Odor   On: 09/07/2020 17:26       Narrative & Impression  CLINICAL DATA:  80 year old female with history of severe aortic stenosis. Preprocedural study prior to potential transcatheter aortic valve replacement (TAVR) procedure.  EXAM: CT ANGIOGRAPHY CHEST, ABDOMEN AND PELVIS  TECHNIQUE: Multidetector CT imaging through the chest, abdomen and pelvis was performed using the standard protocol during bolus administration of intravenous contrast. Multiplanar reconstructed images and MIPs were obtained and reviewed to evaluate the vascular anatomy.  CONTRAST:  OMNIPAQUE IOHEXOL 350 MG/ML SOLN  COMPARISON:  No priors.  FINDINGS: CTA CHEST FINDINGS  Cardiovascular: Heart size is mildly enlarged. There is no significant pericardial fluid, thickening or pericardial calcification. Atherosclerotic calcifications in the thoracic aorta. No definite coronary artery calcifications. Thickening calcification of the aortic valve. Left-sided pacemaker device in place with lead tips terminating in the right atrium and right ventricular apex.  Mediastinum/Lymph Nodes: No pathologically enlarged mediastinal or hilar lymph nodes. Moderate-sized hiatal hernia. No axillary lymphadenopathy.  Lungs/Pleura: No suspicious appearing pulmonary nodules or masses are noted. No acute consolidative airspace disease. No pleural effusions. No suspicious appearing pulmonary nodules or masses are noted. Tiny calcified granuloma in the periphery of the left upper lobe.  Musculoskeletal/Soft Tissues: Status post left shoulder arthroplasty. There are no aggressive appearing lytic or blastic lesions noted in the visualized portions of the skeleton.  CTA ABDOMEN AND PELVIS FINDINGS  Hepatobiliary: No suspicious cystic or solid hepatic lesions. No intra or extrahepatic biliary ductal dilatation. Gallbladder is unremarkable in appearance.  Pancreas: No pancreatic mass. No pancreatic  ductal dilatation. No pancreatic or peripancreatic fluid collections or inflammatory changes.  Spleen: Unremarkable.  Adrenals/Urinary Tract: 6.5 x 4.9 cm low-attenuation lesion in the anterior aspect of the lower pole of the right kidney, compatible with a large simple cyst. Tiny subcentimeter low-attenuation lesions in the left kidney, too small to characterize, but statistically likely to represent tiny cysts. No hydroureteronephrosis. Urinary bladder is normal in appearance. Right adrenal gland is normal in appearance. Left adrenal nodule measuring 1.9 x 1.6 cm, incompletely characterized.  Stomach/Bowel: The intra-abdominal portion of the stomach is normal. No pathologic dilatation of small bowel or colon. The appendix is not confidently identified and may be surgically  absent. Regardless, there are no inflammatory changes noted adjacent to the cecum to suggest the presence of an acute appendicitis at this time.  Vascular/Lymphatic: Aortic atherosclerosis, with vascular findings and measurements pertinent to potential TAVR procedure, as detailed below. No aneurysm or dissection noted in the abdominal or pelvic vasculature. No lymphadenopathy noted in the abdomen or pelvis.  Reproductive: Uterus and ovaries are unremarkable in appearance.  Other: No significant volume of ascites.  No pneumoperitoneum.  Musculoskeletal: There are no aggressive appearing lytic or blastic lesions noted in the visualized portions of the skeleton.  VASCULAR MEASUREMENTS PERTINENT TO TAVR:  AORTA:  Minimal Aortic Diameter-13 x 12 mm  Severity of Aortic Calcification-severe  RIGHT PELVIS:  Right Common Iliac Artery -  Minimal Diameter-7.9 x 6.7 mm  Tortuosity-severe  Calcification-mild  Right External Iliac Artery -  Minimal Diameter-6.2 x 5.9 mm  Tortuosity-moderate  Calcification-mild  Right Common Femoral Artery -  Minimal Diameter-6.0 x 5.6  mm  Tortuosity-mild  Calcification-mild  LEFT PELVIS:  Left Common Iliac Artery -  Minimal Diameter-8.0 x 8.0 mm  Tortuosity-mild  Calcification-moderate  Left External Iliac Artery -  Minimal Diameter-6.4 x 5.9 mm  Tortuosity-moderate  Calcification-minimal  Left Common Femoral Artery -  Minimal Diameter-6.4 x 5.7 mm  Tortuosity-mild  Calcification-mild  Review of the MIP images confirms the above findings.  IMPRESSION: 1. Vascular findings and measurements pertinent to potential TAVR procedure, as detailed above. 2. Thickening calcification of the aortic valve, compatible with reported clinical history of severe aortic stenosis. 3. Aortic atherosclerosis. 4. Incidental findings, as above.   Electronically Signed   By: Trudie Reed M.D.   On: 09/07/2020 12:44   STS Risk Score: Risk of Mortality: 2.249% Renal Failure: 1.097% Permanent Stroke: 1.127% Prolonged Ventilation: 6.483% DSW Infection: 0.081% Reoperation: 3.304% Morbidity or Mortality: 9.924% Short Length of Stay: 33.962% Long Length of Stay: 5.331%   Impression:  This 80 year old woman has stage D, severe, symptomatic aortic stenosis with near Heart Association class III symptoms of exertional fatigue and shortness of breath consistent with chronic diastolic congestive heart failure.  I have personally reviewed her 2D echocardiogram, cardiac catheterization, and CTA studies.  Her echocardiogram shows a severely calcified aortic valve with restricted leaflet mobility.  There is an indeterminate number of cusps.  The mean gradient is 41 mmHg with a valve area of 0.49 cm consistent with severe aortic stenosis.  Her cardiac catheterization was done at an outside institution but I have reviewed the films and there is no significant coronary stenosis.  She has a left dominant system.  I agree that aortic valve replacement is indicated in this patient for relief of her  symptoms and to prevent progressive left ventricular deterioration.  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  I think transcatheter aortic valve replacement would be the best treatment for her given her advanced age and limitations related to her prior stroke.  Her gated cardiac CTA shows a bicuspid aortic valve with fusion of the right and left cusps with anatomy suitable for TAVR using a SAPIEN 3 valve.  Her abdominal and pelvic CTA shows adequate pelvic vascular anatomy to allow transfemoral insertion.  The patient and her husband were counseled at length regarding treatment alternatives for management of severe symptomatic aortic stenosis. The risks and benefits of surgical intervention has been discussed in detail. Long-term prognosis with medical therapy was discussed. Alternative approaches such as  conventional surgical aortic valve replacement, transcatheter aortic valve replacement, and palliative medical therapy were compared and contrasted at length. This discussion was placed in the context of the patient's own specific clinical presentation and past medical history. All of their questions have been addressed.   Following the decision to proceed with transcatheter aortic valve replacement, a discussion was held regarding what types of management strategies would be attempted intraoperatively in the event of life-threatening complications, including whether or not the patient would be considered a candidate for the use of cardiopulmonary bypass and/or conversion to open sternotomy for attempted surgical intervention.  I think she would be a candidate for emergent sternotomy to manage any intraoperative complications.  The patient is aware of the fact that transient use of cardiopulmonary bypass may be necessary. The patient has been advised of a variety of complications that might develop including but not  limited to risks of death, stroke, paravalvular leak, aortic dissection or other major vascular complications, aortic annulus rupture, device embolization, cardiac rupture or perforation, mitral regurgitation, acute myocardial infarction, arrhythmia, heart block or bradycardia requiring permanent pacemaker placement, congestive heart failure, respiratory failure, renal failure, pneumonia, infection, other late complications related to structural valve deterioration or migration, or other complications that might ultimately cause a temporary or permanent loss of functional independence or other long term morbidity. The patient provides full informed consent for the procedure as described and all questions were answered.    Plan:  She will be scheduled for transfemoral transcatheter aortic valve replacement on Tuesday 10/12/2020.  She will need to stop her Eliquis 5 days preoperatively.  I spent 60 minutes performing this consultation and > 50% of this time was spent face to face counseling and coordinating the care of this patient's severe symptomatic aortic stenosis.   Alleen Borne, MD 09/29/2020 2:50 PM

## 2020-09-29 NOTE — Addendum Note (Signed)
Addended by: Fredderick Phenix on: 09/29/2020 02:23 PM   Modules accepted: Orders

## 2020-09-29 NOTE — Therapy (Signed)
[facility] [address] [place], [place], [address] Phone: [phone]   Fax:  [phone]  Physical Therapy Evaluation  Patient Details  Name: Kaitlyn [name] MRN: [id number] Date of Birth: [birth year] Referring Provider (PT): Brandon Melnick PA_C   Encounter Date: 09/29/2020   PT End of Session - 09/29/20 1307    Visit Number 1    Number of Visits 1    Date for PT Re-Evaluation 09/29/20    PT Start Time 1210    PT Stop Time 1250    PT Time Calculation (min) 40 min    Equipment Utilized During Treatment Gait belt    Activity Tolerance Patient tolerated treatment well    Behavior During Therapy [facility] for tasks assessed/performed           Past Medical History:  Diagnosis Date  . A-fib (HCC)   . Aortic stenosis   . Arthritis    both knees  . Chronic pain   . Presence of permanent cardiac pacemaker   . Sick sinus syndrome (HCC)   . Stroke [facility])     Past Surgical History:  Procedure Laterality Date  . CERVICAL SPINE SURGERY    . PACEMAKER IMPLANT    . PPM GENERATOR CHANGEOUT N/A 10/17/2019   Procedure: PPM GENERATOR CHANGEOUT;  Surgeon: Duke Salvia, MD;  Location: [facility] INVASIVE CV LAB;  Service: Cardiovascular;  Laterality: N/A;  . shoulder replacement Left     There were no vitals filed for this visit.    Subjective Assessment - 09/29/20 1218    Subjective Pt reports that her "blood is not flowing well".  She reports that she fatigues quickly, particularly since having stroke in january of 2021.  She would like to exercise more.  She has been on a diet and losing weight.    Patient Stated Goals Improve function of heart    Currently in Pain? No/denies    Multiple Pain Sites No              [facility] PT Assessment - 09/29/20 0001      Assessment   Medical Diagnosis Severe Aortic Stenosis    Referring Provider (PT) Brandon Melnick PA_C    Onset Date/Surgical Date 06/19/19    Hand Dominance Right    Next MD  Visit today      Precautions   Precautions None      Restrictions   Weight Bearing Restrictions No      Balance Screen   Has the patient fallen in the past 6 months No      Home Environment   Living Arrangements Spouse/significant other    Type of Home House    Home Access Stairs to enter    Entrance Stairs-Number of Steps 15   with railings   Home Layout One level      Prior Function   Level of Independence Independent    Vocation Unemployed    Vocation Requirements --   since stroke not working     Cognition   Overall Cognitive Status Within Functional Limits for tasks assessed      AROM   Overall AROM Comments AROM limited in L UE d/t stroke, otherwise [facility]      Strength   Overall Strength Comments grossly intact L side is generally weaker than R d/t stoke    Right/Left hand Right;Left    Right Hand Grip (lbs) 38    Left Hand Grip (lbs) 21  [facility] Pre-Surgical Assessment - 09/29/20 0001    5 Meter Walk Test- trial 1 9 sec    5 Meter Walk Test- trial 2 8 sec.     5 Meter Walk Test- trial 3 8 sec.    5 meter walk test average 8.33 sec    4 Stage Balance Test tolerated for:  10 sec.    4 Stage Balance Test Position 3    Sit To Stand Test- trial 1 25 sec.    ADL/IADL Independent with: Bathing;Dressing;Meal prep;Finances    ADL/IADL Needs Assistance with: Pincus Badder work    ADL/IADL Fraility Index Vulnerable    BP (mmHg) 118/79    HR (bpm) 95    02 Sat (%RA) 95 %    Modified Borg Scale for Dyspnea 0- Nothing at all    Perceived Rate of Exertion (Borg) 7- Very, very light    BP (mmHg) 145/87    HR (bpm) 112    02 Sat (%RA) 99 %    Modified Borg Scale for Dyspnea 0- Nothing at all    Perceived Rate of Exertion (Borg) 13- Somewhat hard    Aerobic Endurance Distance Walked 950    Endurance additional comments 61.49% of age realted norm           Objective measurements completed on examination: See above findings.      Plan - 09/29/20 1325    Clinical  Impression Statement see clinical impression statement below    Clinical Decision Making Low    PT Frequency One time visit    PT Next Visit Plan Pre-TAVR visit          Clinical Impression Statement: Pt is a 80 y.o. yo female presenting to OP PT for evaluation prior to possible TAVR surgery due to severe aortic stenosis. Pt reports onset of SOB and fatigue approximately 14 months ago. Symptoms are limiting walking, exercise, and some more challenging IADLs such as yardwok. Pt presents with functional ROM and strength, fair balance and is at moderate fall risk 4 stage balance test, fair walking speed and fair aerobic endurance per 6 minute walk test. Pt ambulated 950 feet in 6 min. At time of rest, patient's HR was 112 bpm and O2 was 99 on room air. Pt reported 0/10 shortness of breath on modified scale for dyspnea. Based on the Short Physical Performance Battery, patient has a frailty rating of 6/12 with </= 5/12 considered frail.    Patient will benefit from skilled therapeutic intervention in order to improve the following deficits and impairments:     Visit Diagnosis: Other abnormalities of gait and mobility     Problem List Patient Active Problem List   Diagnosis Date Noted  . Persistent atrial fibrillation (HCC) 04/28/2020  . Secondary hypercoagulable state (HCC) 04/28/2020  . Sick sinus syndrome (HCC) 01/19/2020  . Pacemaker-MDT 01/19/2020  . Aortic stenosis 12/16/2019  . RLS (restless legs syndrome) 11/13/2019  . Prediabetes 11/13/2019  . Reactive depression 11/13/2019  . Long term prescription benzodiazepine use 11/13/2019  . Age-related osteoporosis with current pathological fracture 11/13/2019  . Bilateral primary osteoarthritis of knee 11/13/2019  . Acute exacerbation of CHF (congestive heart failure) (HCC) 10/15/2019  . PAF (paroxysmal atrial fibrillation) (HCC) 10/15/2019  . Essential hypertension 10/15/2019  . History of ischemic stroke 10/15/2019  . Acute CHF  (congestive heart failure) (HCC) 10/15/2019    Alphonzo Severance PT, DPT 09/29/20 1:31 PM  [facility] [address] [place], [place], [address]  Phone: [phone]   Fax:  [phone]  Name: Teralyn G [name] MRN: [id number] Date of Birth: 04/21/1941

## 2020-10-02 ENCOUNTER — Encounter: Payer: Self-pay | Admitting: Physical Therapy

## 2020-10-05 ENCOUNTER — Other Ambulatory Visit: Payer: Self-pay

## 2020-10-05 DIAGNOSIS — I350 Nonrheumatic aortic (valve) stenosis: Secondary | ICD-10-CM

## 2020-10-07 ENCOUNTER — Encounter: Payer: Self-pay | Admitting: Internal Medicine

## 2020-10-07 NOTE — Progress Notes (Signed)
Emailed Sherril Croon, [employer] device rep with device clinic orders.

## 2020-10-07 NOTE — Progress Notes (Signed)
Emailed Device clinic requesting orders. Pt having TAVR surgery 10/12/20.

## 2020-10-07 NOTE — Pre-Procedure Instructions (Signed)
Surgical Instructions:    Your procedure is scheduled on Tuesday, May 17th (09:15 AM- 11:15 AM).  Report to [facility] Main Entrance "A" at 07:15 A.M., then check in with the Admitting office.  Call this number if you have any questions prior to, or have any problems the morning of surgery:  [phone]    Remember:  Do not eat or drink after midnight the night before your surgery.     >>Do not take any medications on the morning of surgery<<    Stop taking apixaban Everlene Balls) from Thursday, May 12th until the day of surgery. You will take your last dose of apixaban Everlene Balls) on Wednesday, May 11th.     As of today, STOP taking any Aspirin (unless otherwise instructed by your surgeon) Aleve, Naproxen, Ibuprofen, Motrin, Advil, Goody's, BC's, all herbal medications, fish oil, and all vitamins.              Special instructions:   [facility]- Preparing For Surgery  Before surgery, you can play an important role. Because skin is not sterile, your skin needs to be as free of germs as possible. You can reduce the number of germs on your skin by washing with CHG (chlorahexidine gluconate) Soap before surgery.  CHG is an antiseptic cleaner which kills germs and bonds with the skin to continue killing germs even after washing.    Oral Hygiene is also important to reduce your risk of infection.  Remember - BRUSH YOUR TEETH THE MORNING OF SURGERY WITH YOUR REGULAR TOOTHPASTE  Please do not use if you have an allergy to CHG or antibacterial soaps. If your skin becomes reddened/irritated stop using the CHG.  Do not shave (including legs and underarms) for at least 48 hours prior to first CHG shower. It is OK to shave your face.  Please follow these instructions carefully.   1. Shower the NIGHT BEFORE SURGERY and the MORNING OF SURGERY  2. If you chose to wash your hair, wash your hair first as usual with your normal shampoo.  3. After you shampoo, rinse your hair and body thoroughly  to remove the shampoo.  4. Wash Face and genitals (private parts) with your normal soap.   5. Use CHG Soap as you would any other liquid soap. You can apply CHG directly to the skin and wash gently with a scrungie or a clean washcloth.   6. Apply the CHG Soap to your body ONLY FROM THE NECK DOWN.  Do not use on open wounds or open sores. Avoid contact with your eyes, ears, mouth and genitals (private parts). Wash Face and genitals (private parts)  with your normal soap.   7. Wash thoroughly, paying special attention to the area where your surgery will be performed.  8. Thoroughly rinse your body with warm water from the neck down.  9. DO NOT shower/wash with your normal soap after using and rinsing off the CHG Soap.  10. Pat yourself dry with a CLEAN TOWEL.  11. Wear CLEAN PAJAMAS to bed the night before surgery.  12. Place CLEAN SHEETS on your bed the night before your surgery.  13. DO NOT SLEEP WITH PETS.   Day of Surgery: SHOWER with CHG soap. Brush your teeth WITH YOUR REGULAR TOOTHPASTE. Wear Clean/Comfortable clothing the morning of surgery. Do not apply any deodorants/lotions.   Do not wear jewelry, make up, or nail polish. Do not shave 48 hours prior to surgery.    Do NOT Smoke (Tobacco/Vaping) or drink Alcohol  24 hours prior to your procedure. Do not bring valuables to the hospital. [facility] is not responsible for any belongings or valuables.  If you use a CPAP at night, you may bring all equipment for your overnight stay.   Contacts, glasses, or dentures may not be worn into surgery, please bring cases for these belongings.   For patients admitted to the hospital, discharge time will be determined by your treatment team.   Patients discharged the day of surgery will not be allowed to drive home, and someone needs to stay with them for 24 hours.    Please read over the following fact sheets that you were given.

## 2020-10-07 NOTE — Progress Notes (Addendum)
PERIOPERATIVE PRESCRIPTION FOR IMPLANTED CARDIAC DEVICE PROGRAMMING Planned Procedure: Transcatheter Aortic Valve Replacement, Transfemoral Approach  Surgeon: Dr Verne Carrow and Dr Evelene Croon  Date of Procedure: 10/12/20   Position during surgery:   Please send documentation back to:  [facility] (Fax # [phone])    Patient Information: Name:  Kaitlyn [name]  DOB:  1941/04/01  MRN:  [id number]    [id number] Device Information:  Clinic EP Physician:  Sherryl Manges, MD   Device Type:  Pacemaker Manufacturer and Phone #:  [employer]: [phone] Pacemaker Dependent?:  Yes.   Date of Last Device Check:  07/15/20 Normal Device Function?:  Yes.    Electrophysiologist's Recommendations:   Have magnet available.  Provide continuous ECG monitoring when magnet is used or reprogramming is to be performed.   Procedure will likely interfere with device function.  Device should be programmed:  Asynchronous pacing during procedure and returned to normal programming after procedure  Per Device Clinic Standing Orders, Lenor Coffin, RN  12:47 PM 10/07/2020

## 2020-10-08 ENCOUNTER — Encounter (HOSPITAL_COMMUNITY): Payer: Self-pay

## 2020-10-08 ENCOUNTER — Encounter (HOSPITAL_COMMUNITY)
Admission: RE | Admit: 2020-10-08 | Discharge: 2020-10-08 | Disposition: A | Discharge status: Home / Self Care | Payer: Medicare Other | Source: Ambulatory Visit | Attending: Cardiovascular Disease | Admitting: Cardiovascular Disease

## 2020-10-08 ENCOUNTER — Ambulatory Visit (HOSPITAL_COMMUNITY)
Admission: RE | Admit: 2020-10-08 | Discharge: 2020-10-08 | Disposition: A | Discharge status: Home / Self Care | Payer: Medicare Other | Source: Ambulatory Visit | Attending: Cardiovascular Disease | Admitting: Cardiovascular Disease

## 2020-10-08 ENCOUNTER — Other Ambulatory Visit: Payer: Self-pay

## 2020-10-08 DIAGNOSIS — I700 Atherosclerosis of aorta: Secondary | ICD-10-CM | POA: Insufficient documentation

## 2020-10-08 DIAGNOSIS — J439 Emphysema, unspecified: Secondary | ICD-10-CM | POA: Diagnosis not present

## 2020-10-08 DIAGNOSIS — I517 Cardiomegaly: Secondary | ICD-10-CM | POA: Diagnosis not present

## 2020-10-08 DIAGNOSIS — Z01818 Encounter for other preprocedural examination: Secondary | ICD-10-CM

## 2020-10-08 DIAGNOSIS — I350 Nonrheumatic aortic (valve) stenosis: Secondary | ICD-10-CM | POA: Diagnosis not present

## 2020-10-08 DIAGNOSIS — Z20822 Contact with and (suspected) exposure to covid-19: Secondary | ICD-10-CM | POA: Diagnosis not present

## 2020-10-08 HISTORY — DX: Cardiac arrhythmia, unspecified: I49.9

## 2020-10-08 HISTORY — DX: Cardiac murmur, unspecified: R01.1

## 2020-10-08 LAB — CBC
HCT: 42 % (ref 36.0–46.0)
Hemoglobin: 13.6 g/dL (ref 12.0–15.0)
MCH: 32.2 pg (ref 26.0–34.0)
MCHC: 32.4 g/dL (ref 30.0–36.0)
MCV: 99.5 fL (ref 80.0–100.0)
Platelets: 179 10*3/uL (ref 150–400)
RBC: 4.22 MIL/uL (ref 3.87–5.11)
RDW: 16.5 % — ABNORMAL HIGH (ref 11.5–15.5)
WBC: 5.8 10*3/uL (ref 4.0–10.5)
nRBC: 0 % (ref 0.0–0.2)

## 2020-10-08 LAB — COMPREHENSIVE METABOLIC PANEL
ALT: 23 U/L (ref 0–44)
AST: 24 U/L (ref 15–41)
Albumin: 3.5 g/dL (ref 3.5–5.0)
Alkaline Phosphatase: 80 U/L (ref 38–126)
Anion gap: 9 (ref 5–15)
BUN: 12 mg/dL (ref 8–23)
CO2: 25 mmol/L (ref 22–32)
Calcium: 9.7 mg/dL (ref 8.9–10.3)
Chloride: 107 mmol/L (ref 98–111)
Creatinine, Ser: 0.76 mg/dL (ref 0.44–1.00)
GFR, Estimated: >60 mL/min (ref >60)
Glucose, Bld: 116 mg/dL — ABNORMAL HIGH (ref 70–99)
Potassium: 3.6 mmol/L (ref 3.5–5.1)
Sodium: 141 mmol/L (ref 135–145)
Total Bilirubin: 1.2 mg/dL (ref 0.3–1.2)
Total Protein: 5.9 g/dL — ABNORMAL LOW (ref 6.5–8.1)

## 2020-10-08 LAB — BLOOD GAS, ARTERIAL
Acid-Base Excess: 1.1 mmol/L (ref 0.0–2.0)
Bicarbonate: 25.4 mmol/L (ref 20.0–28.0)
FIO2: 21
O2 Saturation: 96.8 %
Patient temperature: 37
pCO2 arterial: 42 mmHg (ref 32.0–48.0)
pH, Arterial: 7.398 (ref 7.350–7.450)
pO2, Arterial: 89.9 mmHg (ref 83.0–108.0)

## 2020-10-08 LAB — URINALYSIS, ROUTINE W REFLEX MICROSCOPIC
Bilirubin Urine: NEGATIVE
Glucose, UA: NEGATIVE mg/dL
Hgb urine dipstick: NEGATIVE
Ketones, ur: NEGATIVE mg/dL
Leukocytes,Ua: NEGATIVE
Nitrite: NEGATIVE
Protein, ur: NEGATIVE mg/dL
Specific Gravity, Urine: 1.008 (ref 1.005–1.030)
pH: 7 (ref 5.0–8.0)

## 2020-10-08 LAB — TYPE AND SCREEN
ABO/RH(D): A POS
Antibody Screen: NEGATIVE

## 2020-10-08 LAB — SURGICAL PCR SCREEN
MRSA, PCR: NEGATIVE
Staphylococcus aureus: NEGATIVE

## 2020-10-08 LAB — PROTIME-INR
INR: 1.3 — ABNORMAL HIGH (ref 0.8–1.2)
Prothrombin Time: 16.2 seconds — ABNORMAL HIGH (ref 11.4–15.2)

## 2020-10-08 NOTE — Progress Notes (Signed)
PCP - Lavetta Nielsen, MD Cardiologist - Verne Carrow, MD  PPM/ICD -  Pacemaker [employer] Device Orders - Received.  [employer] Rep aware of upcoming surgery. Rep Notified - Guerry Bruin.   Chest x-ray - 10/08/2020 EKG - 10/08/2020 Stress Test - Denies ECHO - 07/12/2020 Cardiac Cath - 02/09/2020  Sleep Study - Denies CPAP - N/A  Pt denies hx of Diabetes or pre diebetes  Blood Thinner Instructions: Yes. Reported las dose of Eliquis on Wednesday, May 11th. Aspirin Instructions:N/A  ERAS Protcol -No PRE-SURGERY Ensure or G2- N/A  COVID TEST- 10/08/2020.  Quarantine instructions give.  Pt verbalizes understanding.    Anesthesia review: Yes, cardiac hx.  Patient denies shortness of breath, fever, cough and chest pain at PAT appointment   All instructions explained to the patient, with a verbal understanding of the material. Patient agrees to go over the instructions while at home for a better understanding. Patient also instructed to self quarantine after being tested for COVID-19. The opportunity to ask questions was provided.

## 2020-10-09 LAB — SARS CORONAVIRUS 2 (TAT 6-24 HRS): SARS Coronavirus 2: NEGATIVE

## 2020-10-11 MED ORDER — SODIUM CHLORIDE 0.9 % IV SOLN
INTRAVENOUS | Status: DC
  Filled 2020-10-11 (×2): qty 30

## 2020-10-11 MED ORDER — CEFAZOLIN SODIUM-DEXTROSE 2-4 GM/100ML-% IV SOLN
2.0000 g | INTRAVENOUS | Status: AC
  Administered 2020-10-12: 2 g via INTRAVENOUS
  Filled 2020-10-11 (×2): qty 100

## 2020-10-11 MED ORDER — DEXMEDETOMIDINE HCL IN NACL 400 MCG/100ML IV SOLN
0.1000 ug/kg/h | INTRAVENOUS | Status: AC
  Administered 2020-10-12: 28.4 ug via INTRAVENOUS
  Administered 2020-10-12: 1 ug/kg/h via INTRAVENOUS
  Administered 2020-10-12: 28.4 ug via INTRAVENOUS
  Filled 2020-10-11 (×2): qty 100

## 2020-10-11 MED ORDER — POTASSIUM CHLORIDE 2 MEQ/ML IV SOLN
80.0000 meq | INTRAVENOUS | Status: DC
  Filled 2020-10-11 (×2): qty 40

## 2020-10-11 MED ORDER — MAGNESIUM SULFATE 50 % IJ SOLN
40.0000 meq | INTRAMUSCULAR | Status: DC
  Filled 2020-10-11 (×2): qty 9.85

## 2020-10-11 MED ORDER — NOREPINEPHRINE 4 MG/250ML-% IV SOLN
0.0000 ug/min | INTRAVENOUS | Status: AC
  Administered 2020-10-12: 2 ug/min via INTRAVENOUS
  Filled 2020-10-11: qty 250

## 2020-10-11 NOTE — H&P (Signed)
[address]       Kaitlyn [name] [address]             [phone]      Cardiothoracic Surgery Admission History and Physical   Referring Provider is [name], Kaitlyn Ping, MD Primary Cardiologist is Kaitlyn Miss, MD PCP is Kaitlyn Lye, [name], MD      Chief Complaint  Patient presents with  . 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  She suffered a stroke in January 2021 resulting in dysarthria and confusion.  MRI showed a chronic hemorrhagic infarct in the right frontal lobe with surrounding subacute infarct.  She also had problems with acute hepatic encephalopathy at that time with elevated ammonia level.  2D echocardiogram in May 2021 showed a severely calcified aortic valve with severely restricted mobility.  The mean gradient was 21 mmHg with a peak gradient of 32 mmHg.  Aortic valve area by VTI was 0.48 cm.  Dimensionless index was 0.17.  Left ventricular ejection fraction was mildly decreased at 50% with global hypokinesis.  There was mild LVH.  She was admitted to [facility] in September 2021 with shortness of breath and found to be in atrial fibrillation with rapid ventricular response.  Echocardiogram at that time showed ejection fraction of 35 to 40%.  She was transferred to [facility] and had an echo on 02/09/2020 showing severely thickened aortic valve leaflets with severely restricted leaflet mobility.  The mean gradient was measured at 29 mmHg with an aortic valve area of 0.55 cm.  Ejection fraction was 55 to 60% at that time.  There was no evidence of left atrial appendage  thrombus and she was cardioverted to sinus rhythm.  She underwent cardiac catheterization in September 2021 that showed no evidence of coronary disease.  She now presents with a history of progressive exertional fatigue and shortness of breath since around January.  She has had some lower extremity edema bilaterally.  She reports orthopnea.  She has had no dizziness or syncope.  A follow-up echo on 07/12/2020 showed severe aortic stenosis with a mean gradient of 41 mmHg and a peak gradient of 65.2 mmHg.  Aortic valve area by VTI was 0.49 cm.  Left ventricular ejection fraction was 60 to 65% with moderate LVH and grade 2 diastolic dysfunction.  Carotid Doppler examination showed no significant carotid disease.  She lives in [place] with her husband.  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  She now has significant problems with her memory and confusion.  She smokes cigarettes for about 50 years but has stopped and now uses e-cigarettes.  She sees her dentist regularly.      Past Medical History:  Diagnosis Date  . A-fib (HCC)   . Aortic stenosis   . Arthritis    both knees  . Chronic pain   . Presence of permanent cardiac pacemaker   . Sick sinus syndrome (HCC)   . Stroke [facility])          Past Surgical History:  Procedure Laterality Date  . CERVICAL SPINE SURGERY    .  PACEMAKER IMPLANT    . PPM GENERATOR CHANGEOUT N/A 10/17/2019   Procedure: PPM GENERATOR CHANGEOUT;  Surgeon: Duke Salvia, MD;  Location: [facility] INVASIVE CV LAB;  Service: Cardiovascular;  Laterality: N/A;  . shoulder replacement Left          Family History  Problem Relation Age of Onset  . Stroke Father   . Alzheimer's disease Mother     Social History        Socioeconomic History  . Marital status: Married    Spouse name: Not on file  . Number of children: 0  . Years of education: Not on file  . Highest education level: Not on file   Occupational History  . Occupation: Airline pilot  Tobacco Use  . Smoking status: Current Every Day Smoker    Types: E-cigarettes  . Smokeless tobacco: Never Used  . Tobacco comment: vaping all day  Vaping Use  . Vaping Use: Every day  . Start date: 10/27/2013  Substance and Sexual Activity  . Alcohol use: Never  . Drug use: Never  . Sexual activity: Not on file  Other Topics Concern  . Not on file  Social History Narrative  . Not on file   Social Determinants of Health   Financial Resource Strain: Not on file  Food Insecurity: Not on file  Transportation Needs: Not on file  Physical Activity: Not on file  Stress: Not on file  Social Connections: Not on file  Intimate Partner Violence: Not on file    Current Outpatient Medications  Medication Sig Dispense Refill  . albuterol (VENTOLIN HFA) 108 (90 Base) MCG/ACT inhaler Inhale 2 puffs into the lungs every 6 (six) hours as needed for wheezing or shortness of breath. 8 g 2  . amiodarone (PACERONE) 200 MG tablet Take 0.5 tablets (100 mg total) by mouth daily. 90 tablet 3  . apixaban (ELIQUIS) 5 MG TABS tablet Take 1 tablet (5 mg total) by mouth 2 (two) times daily. 60 tablet 5  . furosemide (LASIX) 20 MG tablet Take 1 tablet (20 mg total) by mouth in the morning. 30 tablet 5  . Iron-Vitamins (GERITOL COMPLETE) TABS Take by mouth.    . Magnesium 400 MG TABS Take 400 mg by mouth daily. 30 tablet 5  . Multiple Vitamins-Minerals (MULTIVITAMIN WOMEN 50+ PO) Take by mouth.    . potassium chloride SA (KLOR-CON) 20 MEQ tablet Take 1 tablet (20 mEq total) by mouth daily. 30 tablet 5  . rOPINIRole (REQUIP) 1 MG tablet Take 1 tablet (1 mg total) by mouth at bedtime. 30 tablet 5   No current facility-administered medications for this visit.         Allergies  Allergen Reactions  . Lisinopril Cough           Review of Systems:              General:                      normal appetite but husband says she  eats like a bird, + decreased energy, no weight gain, + weight loss since on diet, no fever             Cardiac:                       no chest pain with exertion, no chest pain at rest, +SOB with mild exertion, + resting SOB if standing for a while, no PND, +  orthopnea, + palpitations, + arrhythmia, + atrial fibrillation, no LE edema, no dizzy spells, no syncope             Respiratory:                 + shortness of breath, no home oxygen, no productive cough, no dry cough, no bronchitis, no wheezing, no hemoptysis, no asthma, no pain with inspiration or cough, + sleep apnea, + CPAP at night             GI:                               no difficulty swallowing, no reflux, no frequent heartburn, no hiatal hernia, no abdominal pain, no constipation, no diarrhea, no hematochezia, no hematemesis, no melena             GU:                              no dysuria,  no frequency, no urinary tract infection, no hematuria, no kidney stones, no kidney disease             Vascular:                     no pain suggestive of claudication, no pain in feet, no leg cramps, no varicose veins, no DVT, no non-healing foot ulcer             Neuro:                         + stroke, no TIA's, no seizures, no headaches, no temporary blindness one eye,  no slurred speech, no peripheral neuropathy, no chronic pain, no instability of gait, + memory/cognitive dysfunction             Musculoskeletal:         + arthritis, + joint swelling, no myalgias, no difficulty walking, + mildly decreased mobility              Skin:                            no rash, no itching, no skin infections, no pressure sores or ulcerations             Psych:                         no anxiety, no depression, no nervousness, no unusual recent stress             Eyes:                           no blurry vision, no floaters, no recent vision changes, does not wear glasses or contacts             ENT:                            no hearing loss, no loose  or painful teeth, no dentures, last saw dentist this year             Hematologic:               no easy  bruising, no abnormal bleeding, no clotting disorder, no frequent epistaxis             Endocrine:                   no diabetes, does not check CBG's at home                            Physical Exam:              BP 113/73 (BP Location: Left Arm, Patient Position: Sitting)   Pulse 82   Resp 20   Ht  (1.549 m)   SpO2 94% Comment: RA  BMI 24.53 kg/m              General:                      Elderly,  well-appearing             HEENT:                       Unremarkable, NCAT, PERLA, EOMI             Neck:                           no JVD, no bruits, no adenopathy              Chest:                          clear to auscultation, symmetrical breath sounds, no wheezes, no rhonchi              CV:                              RRR, grade lll/VI crescendo/decrescendo murmur heard best at RSB,  no diastolic murmur             Abdomen:                    soft, non-tender, no masses              Extremities:                 warm, well-perfused, pulses palpable at ankle, no LE edema             Rectal/GU                   Deferred             Neuro:                         Grossly non-focal and symmetrical throughout, mentation seems good.             Skin:                            Clean and dry, no rashes, no breakdown   Diagnostic Tests:   ECHOCARDIOGRAM REPORT       Patient Name:  Kaitlyn [name] Date of Exam: 07/12/2020  Medical Rec #: [id number]   Height:    61.0 in  Accession #:  [id number]  Weight:    137.4 lb  Date of Birth: 26-Dec-1940   BSA:     1.610 m  Patient Age:  29 years   BP:      128/84 mmHg  Patient Gender: F       HR:      65 bpm.  Exam Location: [address]   Procedure: 2D Echo, Cardiac Doppler and Color Doppler   Indications:  I35.0 AS    History:    Patient has prior history of Echocardiogram  examinations,  most         recent 10/16/2019. Pacemaker, Stroke, AS, Arrythmias:Atrial         Fibrillation; Risk Factors:Current Smoker and  Hypertension.    Sonographer:  Samule Ohm RDCS  Referring Phys: [id number] RENEE LYNN URSUY   IMPRESSIONS    1. Left ventricular ejection fraction, by estimation, is 60 to 65%. The  left ventricle has normal function. The left ventricle has no regional  wall motion abnormalities. There is moderate left ventricular hypertrophy.  Left ventricular diastolic  parameters are consistent with Grade II diastolic dysfunction  (pseudonormalization). Elevated left atrial pressure.  2. Right ventricular systolic function is normal. The right ventricular  size is normal. There is moderately elevated pulmonary artery systolic  pressure.  3. Left atrial size was severely dilated.  4. The mitral valve is normal in structure. Mild mitral valve  regurgitation. No evidence of mitral stenosis.  5. The aortic valve has an indeterminant number of cusps. Aortic valve  regurgitation is not visualized. Severe aortic valve stenosis.  6. The inferior vena cava is normal in size with greater than 50%  respiratory variability, suggesting right atrial pressure of 3 mmHg.   FINDINGS  Left Ventricle: Left ventricular ejection fraction, by estimation, is 60  to 65%. The left ventricle has normal function. The left ventricle has no  regional wall motion abnormalities. The left ventricular internal cavity  size was normal in size. There is  moderate left ventricular hypertrophy. Left ventricular diastolic  parameters are consistent with Grade II diastolic dysfunction  (pseudonormalization). Elevated left atrial pressure.   Right Ventricle: The right ventricular size is normal. Right ventricular  systolic function is normal. There is moderately elevated pulmonary artery  systolic pressure. The tricuspid regurgitant velocity is 3.39 m/s, and   with an assumed right atrial  pressure of 3 mmHg, the estimated right ventricular systolic pressure is  49.0 mmHg.   Left Atrium: Left atrial size was severely dilated.   Right Atrium: Right atrial size was normal in size.   Pericardium: There is no evidence of pericardial effusion.   Mitral Valve: The mitral valve is normal in structure. Mild mitral annular  calcification. Mild mitral valve regurgitation. No evidence of mitral  valve stenosis.   Tricuspid Valve: The tricuspid valve is normal in structure. Tricuspid  valve regurgitation is mild . No evidence of tricuspid stenosis.   Aortic Valve: The aortic valve has an indeterminant number of cusps.  Aortic valve regurgitation is not visualized. Severe aortic stenosis is  present. Aortic valve mean gradient measures 41.0 mmHg. Aortic valve peak  gradient measures 65.2 mmHg. Aortic  valve area, by VTI measures 0.49 cm.   Pulmonic Valve: The pulmonic valve was normal in structure. Pulmonic valve  regurgitation is mild. No evidence of pulmonic stenosis.   Aorta: The aortic root is normal in size and structure.   Venous: The inferior vena cava is normal in size with greater than 50%  respiratory variability, suggesting right atrial pressure of 3 mmHg.  IAS/Shunts: No atrial level shunt detected by color flow Doppler.   Additional Comments: A pacer wire is visualized.     LEFT VENTRICLE  PLAX 2D  LVIDd:     4.20 cm Diastology  LVIDs:     2.90 cm LV e' medial:  3.70 cm/s  LV PW:     1.50 cm LV E/e' medial: 35.7  LV IVS:    1.40 cm LV e' lateral:  9.79 cm/s  LVOT diam:   1.90 cm LV E/e' lateral: 13.5  LV SV:     51  LV SV Index:  32  LVOT Area:   2.84 cm     RIGHT VENTRICLE       IVC  RV S prime:   13.80 cm/s IVC diam: 1.70 cm  TAPSE (M-mode): 2.4 cm  RVSP:      49.0 mmHg   LEFT ATRIUM       Index    RIGHT ATRIUM      Index  LA diam:    4.50 cm  2.79 cm/m RA Pressure: 3.00 mmHg  LA Vol (A2C):  72.6 ml 45.09 ml/m RA Area:   18.30 cm  LA Vol (A4C):  68.7 ml 42.67 ml/m RA Volume:  51.10 ml 31.74 ml/m  LA Biplane Vol: 70.6 ml 43.85 ml/m  AORTIC VALVE  AV Area (Vmax):  0.51 cm  AV Area (Vmean):  0.47 cm  AV Area (VTI):   0.49 cm  AV Vmax:      403.67 cm/s  AV Vmean:     304.667 cm/s  AV VTI:      1.057 m  AV Peak Grad:   65.2 mmHg  AV Mean Grad:   41.0 mmHg  LVOT Vmax:     73.10 cm/s  LVOT Vmean:    50.200 cm/s  LVOT VTI:     0.181 m  LVOT/AV VTI ratio: 0.17    AORTA  Ao Root diam: 3.30 cm  Ao Asc diam: 3.50 cm   MV E velocity: 132.00 cm/s TRICUSPID VALVE  MV A velocity: 69.40 cm/s  TR Peak grad:  46.0 mmHg  MV E/A ratio: 1.90     TR Vmax:    339.00 cm/s               Estimated RAP: 3.00 mmHg               RVSP:      49.0 mmHg                 SHUNTS               Systemic VTI: 0.18 m               Systemic Diam: 1.90 cm   Olga Millers MD  Electronically signed by Olga Millers MD  Signature Date/Time: 07/12/2020/12:50:10 PM    ADDENDUM REPORT: 09/07/2020 17:26  CLINICAL DATA: Severe Aortic Stenosis.  EXAM: Cardiac TAVR CT  TECHNIQUE: The patient was scanned on a [employer]. A 100 kV retrospective scan was triggered in the descending thoracic aorta at 111 HU's. Gantry rotation speed was 250 msecs and collimation was .6 mm. No beta blockade or nitro were given. The 3D data set was reconstructed in 5% intervals of the R-R cycle. Systolic and diastolic phases were analyzed on a dedicated work station using MPR, MIP and VRT modes. The patient received 80 cc of contrast.  FINDINGS: Image quality: Excellent.  Noise artifact is: Limited.  Valve Morphology: The aortic valve is bicuspid with fusion of the RCC/LCC. It appears to be a 2 sinus  (anterior-posterior) bicuspid aortic valve without raphe. The leaflets are diffusely calcified with severely restricted motion in systole.  Aortic Valve Calcium score: 1883  Aortic annular dimension:  Phase assessed: 160 ms  Annular area: 420 mm2  Annular perimeter: 74.2 mm  Max diameter: 26.4 mm  Min diameter: 20.2 mm  Annular and subannular calcification: None. There is a small diverticulum in the LVOT ~ 1-2 mm below the annulus under the NCC.  Optimal coplanar projection: LAO 23 CAU 19  Coronary Artery Height above Annulus:  Left Main: 12.1 mm  Right Coronary: 24.2 mm  Sinus of Valsalva Measurements:  Commissure to commissure: 28 mm  Max diameter: 36 mm  Sinus of Valsalva Height:  Non-coronary: 28.0 mm  Right-coronary: 20.0 mm  Left-coronary: 18.5 mm  Sinotubular Junction: 28 mm  Ascending Thoracic Aorta: Ectatic up to 40 mm (double-oblique)  Coronary Arteries: Normal coronary origin. Right dominance. The study was performed without use of NTG and is insufficient for plaque evaluation. Please refer to recent cardiac catheterization for coronary assessment.  Cardiac Morphology:  Right Atrium: Right atrial size is within normal limits.  Right Ventricle: The right ventricular cavity is within normal limits.  Left Atrium: Left atrial size is dilated in size with no left atrial appendage filling defect.  Left Ventricle: The ventricular cavity size is within normal limits. There are no stigmata of prior infarction. There is no abnormal filling defect. Normal left ventricular function, EF 65%. No regional wall motion abnormalities.  Pulmonary arteries: Dilated, suggestive of pulmonary hypertension.  Pulmonary veins: Normal pulmonary venous drainage.  Pericardium: Normal thickness with no significant effusion or calcium present.  Mitral Valve: The mitral valve is normal structure without significant  calcification.  Extra-cardiac findings: See attached radiology report for non-cardiac structures.  IMPRESSION: 1. 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 Appears to be a 2 sinus (anterior-posterior) bicuspid aortic valve without raphe.  2. Severe aortic stenosis (calcium score 1883).  3. Annular measurements appropriate for 23 mm Edwards S3 TAVR (420 mm2).  4. No significant annular or subannular calcifications.  5. Small diverticulum in the LVOT 1-2 mm below the annulus under the NCC.  6. Sufficient coronary to annulus distance.  7. Optimal Fluoroscopic Angle for Delivery: LAO 23 CAU 19  8. 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  9. Dilated pulmonary artery suggestive of pulmonary hypertension.  Gerri Spore T. Flora Lipps, MD   Electronically Signed By: Lennie Odor On: 09/07/2020 17:26       Narrative & Impression  CLINICAL DATA: 80 year old female with history of severe aortic stenosis. Preprocedural study prior to potential transcatheter aortic valve replacement (TAVR) procedure.  EXAM: CT ANGIOGRAPHY CHEST, ABDOMEN AND PELVIS  TECHNIQUE: Multidetector CT imaging through the chest, abdomen and pelvis was performed using the standard protocol during bolus administration of intravenous contrast. Multiplanar reconstructed images and MIPs were obtained and reviewed to evaluate the vascular anatomy.  CONTRAST: OMNIPAQUE IOHEXOL 350 MG/ML SOLN  COMPARISON: No priors.  FINDINGS: CTA CHEST FINDINGS  Cardiovascular: Heart size is mildly enlarged. There is no significant pericardial fluid, thickening or pericardial calcification. Atherosclerotic calcifications in the thoracic aorta. No definite coronary artery calcifications. Thickening calcification of the aortic valve. Left-sided pacemaker device in place with lead tips terminating in the right atrium and right ventricular apex.  Mediastinum/Lymph Nodes:  No pathologically enlarged mediastinal or hilar lymph nodes. Moderate-sized  hiatal hernia. No axillary lymphadenopathy.  Lungs/Pleura: No suspicious appearing pulmonary nodules or masses are noted. No acute consolidative airspace disease. No pleural effusions. No suspicious appearing pulmonary nodules or masses are noted. Tiny calcified granuloma in the periphery of the left upper lobe.  Musculoskeletal/Soft Tissues: Status post left shoulder arthroplasty. There are no aggressive appearing lytic or blastic lesions noted in the visualized portions of the skeleton.  CTA ABDOMEN AND PELVIS FINDINGS  Hepatobiliary: No suspicious cystic or solid hepatic lesions. No intra or extrahepatic biliary ductal dilatation. Gallbladder is unremarkable in appearance.  Pancreas: No pancreatic mass. No pancreatic ductal dilatation. No pancreatic or peripancreatic fluid collections or inflammatory changes.  Spleen: Unremarkable.  Adrenals/Urinary Tract: 6.5 x 4.9 cm low-attenuation lesion in the anterior aspect of the lower pole of the right kidney, compatible with a large simple cyst. Tiny subcentimeter low-attenuation lesions in the left kidney, too small to characterize, but statistically likely to represent tiny cysts. No hydroureteronephrosis. Urinary bladder is normal in appearance. Right adrenal gland is normal in appearance. Left adrenal nodule measuring 1.9 x 1.6 cm, incompletely characterized.  Stomach/Bowel: The intra-abdominal portion of the stomach is normal. No pathologic dilatation of small bowel or colon. The appendix is not confidently identified and may be surgically absent. Regardless, there are no inflammatory changes noted adjacent to the cecum to suggest the presence of an acute appendicitis at this time.  Vascular/Lymphatic: Aortic atherosclerosis, with vascular findings and measurements pertinent to potential TAVR procedure, as detailed below. No aneurysm or  dissection noted in the abdominal or pelvic vasculature. No lymphadenopathy noted in the abdomen or pelvis.  Reproductive: Uterus and ovaries are unremarkable in appearance.  Other: No significant volume of ascites. No pneumoperitoneum.  Musculoskeletal: There are no aggressive appearing lytic or blastic lesions noted in the visualized portions of the skeleton.  VASCULAR MEASUREMENTS PERTINENT TO TAVR:  AORTA:  Minimal Aortic Diameter-13 x 12 mm  Severity of Aortic Calcification-severe  RIGHT PELVIS:  Right Common Iliac Artery -  Minimal Diameter-7.9 x 6.7 mm  Tortuosity-severe  Calcification-mild  Right External Iliac Artery -  Minimal Diameter-6.2 x 5.9 mm  Tortuosity-moderate  Calcification-mild  Right Common Femoral Artery -  Minimal Diameter-6.0 x 5.6 mm  Tortuosity-mild  Calcification-mild  LEFT PELVIS:  Left Common Iliac Artery -  Minimal Diameter-8.0 x 8.0 mm  Tortuosity-mild  Calcification-moderate  Left External Iliac Artery -  Minimal Diameter-6.4 x 5.9 mm  Tortuosity-moderate  Calcification-minimal  Left Common Femoral Artery -  Minimal Diameter-6.4 x 5.7 mm  Tortuosity-mild  Calcification-mild  Review of the MIP images confirms the above findings.  IMPRESSION: 1. Vascular findings and measurements pertinent to potential TAVR procedure, as detailed above. 2. Thickening calcification of the aortic valve, compatible with reported clinical history of severe aortic stenosis. 3. Aortic atherosclerosis. 4. Incidental findings, as above.   Electronically Signed By: Trudie Reed M.D. On: 09/07/2020 12:44   STS Risk Score: Risk of Mortality: 2.249% Renal Failure: 1.097% Permanent Stroke: 1.127% Prolonged Ventilation: 6.483% DSW Infection: 0.081% Reoperation: 3.304% Morbidity or Mortality: 9.924% Short Length of Stay: 33.962% Long Length of  Stay: 5.331%   Impression:  This 80 year old woman has stage D, severe, symptomatic aortic stenosis with near Heart Association class III symptoms of exertional fatigue and shortness of breath consistent with chronic diastolic congestive heart failure.  I have personally reviewed her 2D echocardiogram, cardiac catheterization, and CTA studies.  Her echocardiogram shows a severely calcified aortic valve with restricted leaflet mobility.  There is an indeterminate number  of cusps.  The mean gradient is 41 mmHg with a valve area of 0.49 cm consistent with severe aortic stenosis.  Her cardiac catheterization was done at an outside institution but I have reviewed the films and there is no significant coronary stenosis.  She has a left dominant system.  I agree that aortic valve replacement is indicated in this patient for relief of her symptoms and to prevent progressive left ventricular deterioration.  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  I think transcatheter aortic valve replacement would be the best treatment for her given her advanced age and limitations related to her prior stroke.  Her gated cardiac CTA shows a bicuspid aortic valve with fusion of the right and left cusps with anatomy suitable for TAVR using a SAPIEN 3 valve.  Her abdominal and pelvic CTA shows adequate pelvic vascular anatomy to allow transfemoral insertion.  The patient and her husband were counseled at length regarding treatment alternatives for management of severe symptomatic aortic stenosis. The risks and benefits of surgical intervention has been discussed in detail. Long-term prognosis with medical therapy was discussed. Alternative approaches such as conventional surgical aortic valve replacement, transcatheter aortic valve replacement, and palliative medical therapy were compared and contrasted at length. This discussion was  placed in the context of the patient's own specific clinical presentation and past medical history. All of their questions have been addressed.   Following the decision to proceed with transcatheter aortic valve replacement, a discussion was held regarding what types of management strategies would be attempted intraoperatively in the event of life-threatening complications, including whether or not the patient would be considered a candidate for the use of cardiopulmonary bypass and/or conversion to open sternotomy for attempted surgical intervention.  I think she would be a candidate for emergent sternotomy to manage any intraoperative complications.  The patient is aware of the fact that transient use of cardiopulmonary bypass may be necessary. The patient has been advised of a variety of complications that might develop including but not limited to risks of death, stroke, paravalvular leak, aortic dissection or other major vascular complications, aortic annulus rupture, device embolization, cardiac rupture or perforation, mitral regurgitation, acute myocardial infarction, arrhythmia, heart block or bradycardia requiring permanent pacemaker placement, congestive heart failure, respiratory failure, renal failure, pneumonia, infection, other late complications related to structural valve deterioration or migration, or other complications that might ultimately cause a temporary or permanent loss of functional independence or other long term morbidity. The patient provides full informed consent for the procedure as described and all questions were answered.    Plan:  Transfemoral transcatheter aortic valve replacement.    Alleen Borne, MD

## 2020-10-12 ENCOUNTER — Inpatient Hospital Stay (HOSPITAL_COMMUNITY): Payer: Medicare Other | Admitting: Physician Assistant

## 2020-10-12 ENCOUNTER — Encounter (HOSPITAL_COMMUNITY)
Admission: RE | Disposition: A | Discharge status: Home / Self Care | Payer: Self-pay | Source: Ambulatory Visit | Attending: Cardiovascular Disease

## 2020-10-12 ENCOUNTER — Other Ambulatory Visit: Payer: Self-pay | Admitting: Physician Assistant

## 2020-10-12 ENCOUNTER — Inpatient Hospital Stay (HOSPITAL_COMMUNITY)
Admission: RE | Admit: 2020-10-12 | Discharge: 2020-10-13 | DRG: 267 | Disposition: A | Discharge status: Home / Self Care | Payer: Medicare Other | Source: Ambulatory Visit | Attending: Cardiovascular Disease | Admitting: Cardiovascular Disease

## 2020-10-12 ENCOUNTER — Inpatient Hospital Stay (HOSPITAL_COMMUNITY): Payer: Medicare Other

## 2020-10-12 ENCOUNTER — Encounter (HOSPITAL_COMMUNITY): Payer: Self-pay | Admitting: Cardiovascular Disease

## 2020-10-12 ENCOUNTER — Other Ambulatory Visit: Payer: Self-pay

## 2020-10-12 ENCOUNTER — Inpatient Hospital Stay (HOSPITAL_COMMUNITY): Payer: Medicare Other | Admitting: Certified Registered"

## 2020-10-12 DIAGNOSIS — I350 Nonrheumatic aortic (valve) stenosis: Secondary | ICD-10-CM | POA: Diagnosis present

## 2020-10-12 DIAGNOSIS — I495 Sick sinus syndrome: Secondary | ICD-10-CM | POA: Diagnosis present

## 2020-10-12 DIAGNOSIS — I69322 Dysarthria following cerebral infarction: Secondary | ICD-10-CM

## 2020-10-12 DIAGNOSIS — I69311 Memory deficit following cerebral infarction: Secondary | ICD-10-CM | POA: Diagnosis not present

## 2020-10-12 DIAGNOSIS — Z006 Encounter for examination for normal comparison and control in clinical research program: Secondary | ICD-10-CM

## 2020-10-12 DIAGNOSIS — Q231 Congenital insufficiency of aortic valve: Principal | ICD-10-CM

## 2020-10-12 DIAGNOSIS — M170 Bilateral primary osteoarthritis of knee: Secondary | ICD-10-CM | POA: Diagnosis present

## 2020-10-12 DIAGNOSIS — I4819 Other persistent atrial fibrillation: Secondary | ICD-10-CM | POA: Diagnosis present

## 2020-10-12 DIAGNOSIS — Z952 Presence of prosthetic heart valve: Secondary | ICD-10-CM

## 2020-10-12 DIAGNOSIS — Z96612 Presence of left artificial shoulder joint: Secondary | ICD-10-CM | POA: Diagnosis present

## 2020-10-12 DIAGNOSIS — Z888 Allergy status to other drugs, medicaments and biological substances status: Secondary | ICD-10-CM | POA: Diagnosis not present

## 2020-10-12 DIAGNOSIS — I700 Atherosclerosis of aorta: Secondary | ICD-10-CM | POA: Diagnosis present

## 2020-10-12 DIAGNOSIS — Z7901 Long term (current) use of anticoagulants: Secondary | ICD-10-CM | POA: Diagnosis not present

## 2020-10-12 DIAGNOSIS — I10 Essential (primary) hypertension: Secondary | ICD-10-CM | POA: Diagnosis present

## 2020-10-12 DIAGNOSIS — F17290 Nicotine dependence, other tobacco product, uncomplicated: Secondary | ICD-10-CM | POA: Diagnosis present

## 2020-10-12 DIAGNOSIS — Z79899 Other long term (current) drug therapy: Secondary | ICD-10-CM | POA: Diagnosis not present

## 2020-10-12 DIAGNOSIS — Z823 Family history of stroke: Secondary | ICD-10-CM | POA: Diagnosis not present

## 2020-10-12 DIAGNOSIS — R7303 Prediabetes: Secondary | ICD-10-CM | POA: Diagnosis present

## 2020-10-12 DIAGNOSIS — I5023 Acute on chronic systolic (congestive) heart failure: Secondary | ICD-10-CM | POA: Diagnosis not present

## 2020-10-12 DIAGNOSIS — Z950 Presence of cardiac pacemaker: Secondary | ICD-10-CM | POA: Diagnosis not present

## 2020-10-12 DIAGNOSIS — I110 Hypertensive heart disease with heart failure: Secondary | ICD-10-CM | POA: Diagnosis present

## 2020-10-12 DIAGNOSIS — I480 Paroxysmal atrial fibrillation: Secondary | ICD-10-CM | POA: Diagnosis present

## 2020-10-12 DIAGNOSIS — Z8673 Personal history of transient ischemic attack (TIA), and cerebral infarction without residual deficits: Secondary | ICD-10-CM

## 2020-10-12 DIAGNOSIS — I5042 Chronic combined systolic (congestive) and diastolic (congestive) heart failure: Secondary | ICD-10-CM | POA: Diagnosis present

## 2020-10-12 DIAGNOSIS — Z8719 Personal history of other diseases of the digestive system: Secondary | ICD-10-CM

## 2020-10-12 DIAGNOSIS — G8929 Other chronic pain: Secondary | ICD-10-CM | POA: Diagnosis present

## 2020-10-12 HISTORY — PX: TEE WITHOUT CARDIOVERSION: SHX5443

## 2020-10-12 HISTORY — DX: Personal history of other diseases of the digestive system: Z87.19

## 2020-10-12 HISTORY — PX: TRANSCATHETER AORTIC VALVE REPLACEMENT, TRANSFEMORAL: SHX6400

## 2020-10-12 HISTORY — DX: Other persistent atrial fibrillation: I48.19

## 2020-10-12 HISTORY — DX: Nonrheumatic aortic (valve) stenosis: I35.0

## 2020-10-12 HISTORY — DX: Presence of prosthetic heart valve: Z95.2

## 2020-10-12 LAB — POCT I-STAT, CHEM 8
BUN: 12 mg/dL (ref 8–23)
BUN: 11 mg/dL (ref 8–23)
BUN: 11 mg/dL (ref 8–23)
Calcium, Ion: 1.36 mmol/L (ref 1.15–1.40)
Calcium, Ion: 1.32 mmol/L (ref 1.15–1.40)
Calcium, Ion: 1.33 mmol/L (ref 1.15–1.40)
Chloride: 103 mmol/L (ref 98–111)
Chloride: 104 mmol/L (ref 98–111)
Chloride: 106 mmol/L (ref 98–111)
Creatinine, Ser: 0.6 mg/dL (ref 0.44–1.00)
Creatinine, Ser: 0.6 mg/dL (ref 0.44–1.00)
Creatinine, Ser: 0.6 mg/dL (ref 0.44–1.00)
Glucose, Bld: 133 mg/dL — ABNORMAL HIGH (ref 70–99)
Glucose, Bld: 125 mg/dL — ABNORMAL HIGH (ref 70–99)
Glucose, Bld: 129 mg/dL — ABNORMAL HIGH (ref 70–99)
HCT: 36 % (ref 36.0–46.0)
HCT: 37 % (ref 36.0–46.0)
HCT: 36 % (ref 36.0–46.0)
Hemoglobin: 12.2 g/dL (ref 12.0–15.0)
Hemoglobin: 12.6 g/dL (ref 12.0–15.0)
Hemoglobin: 12.2 g/dL (ref 12.0–15.0)
Potassium: 3.7 mmol/L (ref 3.5–5.1)
Potassium: 3.7 mmol/L (ref 3.5–5.1)
Potassium: 3.7 mmol/L (ref 3.5–5.1)
Sodium: 142 mmol/L (ref 135–145)
Sodium: 142 mmol/L (ref 135–145)
Sodium: 144 mmol/L (ref 135–145)
TCO2: 29 mmol/L (ref 22–32)
TCO2: 28 mmol/L (ref 22–32)
TCO2: 29 mmol/L (ref 22–32)

## 2020-10-12 LAB — POCT ACTIVATED CLOTTING TIME
Activated Clotting Time: 132 seconds
Activated Clotting Time: 261 seconds
Activated Clotting Time: 113 seconds

## 2020-10-12 LAB — ECHOCARDIOGRAM LIMITED
AR max vel: 0.83 cm2
AV Area VTI: 0.71 cm2
AV Area mean vel: 0.76 cm2
AV Mean grad: 21 mmHg
AV Peak grad: 25.9 mmHg
Ao pk vel: 2.55 m/s
Calc EF: 56.1 %
Single Plane A2C EF: 57.9 %
Single Plane A4C EF: 53.4 %

## 2020-10-12 LAB — POCT I-STAT 7, (LYTES, BLD GAS, ICA,H+H)
Acid-Base Excess: 3 mmol/L — ABNORMAL HIGH (ref 0.0–2.0)
Bicarbonate: 30.3 mmol/L — ABNORMAL HIGH (ref 20.0–28.0)
Calcium, Ion: 1.37 mmol/L (ref 1.15–1.40)
HCT: 37 % (ref 36.0–46.0)
Hemoglobin: 12.6 g/dL (ref 12.0–15.0)
O2 Saturation: 100 %
Potassium: 3.7 mmol/L (ref 3.5–5.1)
Sodium: 143 mmol/L (ref 135–145)
TCO2: 32 mmol/L (ref 22–32)
pCO2 arterial: 56.1 mmHg — ABNORMAL HIGH (ref 32.0–48.0)
pH, Arterial: 7.34 — ABNORMAL LOW (ref 7.350–7.450)
pO2, Arterial: 315 mmHg — ABNORMAL HIGH (ref 83.0–108.0)

## 2020-10-12 LAB — ABO/RH: ABO/RH(D): A POS

## 2020-10-12 SURGERY — IMPLANTATION, AORTIC VALVE, TRANSCATHETER, FEMORAL APPROACH
Anesthesia: Monitor Anesthesia Care

## 2020-10-12 MED ORDER — SODIUM CHLORIDE 0.9% FLUSH
3.0000 mL | Freq: Two times a day (BID) | INTRAVENOUS | Status: DC
  Administered 2020-10-12 – 2020-10-13 (×2): 3 mL via INTRAVENOUS

## 2020-10-12 MED ORDER — CEFAZOLIN SODIUM-DEXTROSE 2-4 GM/100ML-% IV SOLN
2.0000 g | Freq: Three times a day (TID) | INTRAVENOUS | Status: AC
Start: 2020-10-12 — End: 2020-10-13
  Administered 2020-10-12 – 2020-10-13 (×2): 2 g via INTRAVENOUS
  Filled 2020-10-12 (×2): qty 100

## 2020-10-12 MED ORDER — LACTATED RINGERS IV SOLN: INTRAVENOUS | Status: DC | PRN

## 2020-10-12 MED ORDER — FERROUS SULFATE 325 (65 FE) MG PO TABS
325.0000 mg | ORAL_TABLET | Freq: Every day | ORAL | Status: DC
  Administered 2020-10-13: 325 mg via ORAL
  Filled 2020-10-12: qty 1

## 2020-10-12 MED ORDER — MAGNESIUM OXIDE -MG SUPPLEMENT 400 (240 MG) MG PO TABS
400.0000 mg | ORAL_TABLET | Freq: Every day | ORAL | Status: DC
  Administered 2020-10-13: 400 mg via ORAL
  Filled 2020-10-12: qty 1

## 2020-10-12 MED ORDER — ACETAMINOPHEN 325 MG PO TABS: 650.0000 mg | ORAL_TABLET | Freq: Four times a day (QID) | ORAL | Status: DC | PRN

## 2020-10-12 MED ORDER — AMIODARONE HCL 200 MG PO TABS
100.0000 mg | ORAL_TABLET | Freq: Every day | ORAL | Status: DC
  Administered 2020-10-12 – 2020-10-13 (×2): 100 mg via ORAL
  Filled 2020-10-12 (×2): qty 1

## 2020-10-12 MED ORDER — SODIUM CHLORIDE 0.9 % IV SOLN: INTRAVENOUS | Status: AC

## 2020-10-12 MED ORDER — PROPOFOL 500 MG/50ML IV EMUL
INTRAVENOUS | Status: DC | PRN
  Administered 2020-10-12: 45 ug/kg/min via INTRAVENOUS
  Administered 2020-10-12: 20 ug/kg/min via INTRAVENOUS

## 2020-10-12 MED ORDER — IOHEXOL 350 MG/ML SOLN
INTRAVENOUS | Status: DC | PRN
  Administered 2020-10-12: 45 mL

## 2020-10-12 MED ORDER — PHENYLEPHRINE HCL-NACL 20-0.9 MG/250ML-% IV SOLN
0.0000 ug/min | INTRAVENOUS | Status: DC
  Filled 2020-10-12: qty 250

## 2020-10-12 MED ORDER — CHLORHEXIDINE GLUCONATE 4 % EX LIQD: 60.0000 mL | Freq: Once | CUTANEOUS | Status: DC

## 2020-10-12 MED ORDER — CHLORHEXIDINE GLUCONATE 0.12 % MT SOLN: 15.0000 mL | Freq: Once | OROMUCOSAL | Status: AC

## 2020-10-12 MED ORDER — HEPARIN (PORCINE) IN NACL 1000-0.9 UT/500ML-% IV SOLN
INTRAVENOUS | Status: AC
  Filled 2020-10-12: qty 1500

## 2020-10-12 MED ORDER — TRAMADOL HCL 50 MG PO TABS
50.0000 mg | ORAL_TABLET | ORAL | Status: DC | PRN
Start: 2020-10-12 — End: 2020-10-13

## 2020-10-12 MED ORDER — OXYCODONE HCL 5 MG PO TABS
5.0000 mg | ORAL_TABLET | ORAL | Status: DC | PRN
Start: 2020-10-12 — End: 2020-10-13

## 2020-10-12 MED ORDER — ONDANSETRON HCL 4 MG/2ML IJ SOLN: 4.0000 mg | Freq: Four times a day (QID) | INTRAMUSCULAR | Status: DC | PRN

## 2020-10-12 MED ORDER — ASPIRIN 81 MG PO CHEW
81.0000 mg | CHEWABLE_TABLET | Freq: Every day | ORAL | Status: DC
  Administered 2020-10-13: 81 mg via ORAL
  Filled 2020-10-12: qty 1

## 2020-10-12 MED ORDER — CHLORHEXIDINE GLUCONATE 0.12 % MT SOLN
OROMUCOSAL | Status: AC
  Administered 2020-10-12: 15 mL via OROMUCOSAL
  Filled 2020-10-12: qty 15

## 2020-10-12 MED ORDER — ONDANSETRON HCL 4 MG/2ML IJ SOLN
INTRAMUSCULAR | Status: DC | PRN
  Administered 2020-10-12: 4 mg via INTRAVENOUS

## 2020-10-12 MED ORDER — PROTAMINE SULFATE 10 MG/ML IV SOLN
INTRAVENOUS | Status: DC | PRN
  Administered 2020-10-12: 80 mg via INTRAVENOUS
  Administered 2020-10-12: 10 mg via INTRAVENOUS

## 2020-10-12 MED ORDER — SODIUM CHLORIDE 0.9 % IV SOLN: 250.0000 mL | INTRAVENOUS | Status: DC | PRN

## 2020-10-12 MED ORDER — MORPHINE SULFATE (PF) 2 MG/ML IV SOLN
1.0000 mg | INTRAVENOUS | Status: DC | PRN
Start: 2020-10-12 — End: 2020-10-13

## 2020-10-12 MED ORDER — LIDOCAINE HCL 1 % IJ SOLN
INTRAMUSCULAR | Status: AC
  Filled 2020-10-12: qty 20

## 2020-10-12 MED ORDER — LIDOCAINE HCL (PF) 1 % IJ SOLN
INTRAMUSCULAR | Status: DC | PRN
  Administered 2020-10-12: 20 mL

## 2020-10-12 MED ORDER — NITROGLYCERIN IN D5W 200-5 MCG/ML-% IV SOLN: 0.0000 ug/min | INTRAVENOUS | Status: DC

## 2020-10-12 MED ORDER — SODIUM CHLORIDE 0.9% FLUSH: 3.0000 mL | INTRAVENOUS | Status: DC | PRN

## 2020-10-12 MED ORDER — SODIUM CHLORIDE 0.9 % IV SOLN: INTRAVENOUS | Status: DC

## 2020-10-12 MED ORDER — HEPARIN SODIUM (PORCINE) 1000 UNIT/ML IJ SOLN
INTRAMUSCULAR | Status: DC | PRN
  Administered 2020-10-12: 9000 [IU] via INTRAVENOUS

## 2020-10-12 MED ORDER — ALBUTEROL SULFATE HFA 108 (90 BASE) MCG/ACT IN AERS
2.0000 | INHALATION_SPRAY | Freq: Four times a day (QID) | RESPIRATORY_TRACT | Status: DC | PRN
  Filled 2020-10-12: qty 6.7

## 2020-10-12 MED ORDER — HEPARIN (PORCINE) IN NACL 1000-0.9 UT/500ML-% IV SOLN
INTRAVENOUS | Status: DC | PRN
  Administered 2020-10-12 (×3): 500 mL

## 2020-10-12 MED ORDER — ACETAMINOPHEN 650 MG RE SUPP: 650.0000 mg | Freq: Four times a day (QID) | RECTAL | Status: DC | PRN

## 2020-10-12 MED ORDER — CHLORHEXIDINE GLUCONATE 4 % EX LIQD: 30.0000 mL | CUTANEOUS | Status: DC

## 2020-10-12 MED ORDER — CHLORHEXIDINE GLUCONATE 4 % EX LIQD
60.0000 mL | Freq: Once | CUTANEOUS | Status: DC
Start: 2020-10-12 — End: 2020-10-12

## 2020-10-12 SURGICAL SUPPLY — 31 items
BAG SNAP BAND KOVER 36X36 (MISCELLANEOUS) ×4 IMPLANT
BLANKET WARM UNDERBOD FULL ACC (MISCELLANEOUS) ×2 IMPLANT
CABLE ADAPT PACING TEMP 12FT (ADAPTER) ×2 IMPLANT
CATH 23 ULTRA DELIVERY (CATHETERS) ×2 IMPLANT
CATH DIAG 6FR PIGTAIL ANGLED (CATHETERS) ×4 IMPLANT
CATH INFINITI 6F AL1 (CATHETERS) ×2 IMPLANT
CATH S G BIP PACING (CATHETERS) ×2 IMPLANT
CLOSURE MYNX CONTROL 6F/7F (Vascular Products) ×2 IMPLANT
CLOSURE PERCLOSE PROSTYLE (VASCULAR PRODUCTS) ×4 IMPLANT
CRIMPER (MISCELLANEOUS) ×2 IMPLANT
DERMABOND ADVANCED (GAUZE/BANDAGES/DRESSINGS) ×1
DERMABOND ADVANCED .7 DNX12 (GAUZE/BANDAGES/DRESSINGS) ×1 IMPLANT
DEVICE INFLATION ATRION QL2530 (MISCELLANEOUS) ×2 IMPLANT
GUIDEWIRE SAFE TJ AMPLATZ EXST (WIRE) ×2 IMPLANT
KIT HEART LEFT (KITS) ×2 IMPLANT
KIT MICROPUNCTURE NIT STIFF (SHEATH) ×2 IMPLANT
PACK CARDIAC CATHETERIZATION (CUSTOM PROCEDURE TRAY) ×2 IMPLANT
SHEATH BRITE TIP 7FR 35CM (SHEATH) ×2 IMPLANT
SHEATH EDWARDS INTRO SET 14X36 (SHEATH) ×2 IMPLANT
SHEATH PINNACLE 6F 10CM (SHEATH) ×2 IMPLANT
SHEATH PINNACLE 8F 10CM (SHEATH) ×2 IMPLANT
SHEATH PROBE COVER 6X72 (BAG) ×2 IMPLANT
SLEEVE REPOSITIONING LENGTH 30 (MISCELLANEOUS) ×2 IMPLANT
STOPCOCK MORSE 400PSI 3WAY (MISCELLANEOUS) ×4 IMPLANT
TRANSDUCER W/STOPCOCK (MISCELLANEOUS) ×4 IMPLANT
TUBING CONTRAST HIGH PRESS 48 (TUBING) ×2 IMPLANT
VALVE 23 ULTRA SAPIEN KIT (Valve) ×2 IMPLANT
WIRE AMPLATZ SS-J .035X180CM (WIRE) ×2 IMPLANT
WIRE EMERALD 3MM-J .035X150CM (WIRE) ×2 IMPLANT
WIRE EMERALD 3MM-J .035X260CM (WIRE) ×2 IMPLANT
WIRE EMERALD ST .035X260CM (WIRE) ×2 IMPLANT

## 2020-10-12 NOTE — Progress Notes (Signed)
  [facility]   MULTIDISCIPLINARY HEART VALVE TEAM  Patient doing well s/p TAVR. She is hemodynamically stable. Groin sites stable. ECG with paced rhythm. Arterial line discontinued and transferred to 4E. Plan for early ambulation after bedrest completed and hopeful discharge over the next 24-48 hours.   Cline Crock PA-C  [facility]  Pager [phone]

## 2020-10-12 NOTE — Progress Notes (Signed)
Patient arrived from cath lab to 4e13. Patient with B gorins level 0. CHG and vital signs obtained. Patient placed on monitor. Will Monitor patient. Siaosi Alter, Randall An rN

## 2020-10-12 NOTE — Transfer of Care (Signed)
Immediate Anesthesia Transfer of Care Note  Patient: Kaitlyn [name]  Procedure(s) Performed: TRANSCATHETER AORTIC VALVE REPLACEMENT, TRANSFEMORAL (N/A ) TRANSESOPHAGEAL ECHOCARDIOGRAM (TEE) (N/A )  Patient Location: Cath Lab  Anesthesia Type:MAC  Level of Consciousness: awake, alert  and oriented  Airway & Oxygen Therapy: Patient Spontanous Breathing and Patient connected to face mask oxygen  Post-op Assessment: Report given to RN and Post -op Vital signs reviewed and stable  Post vital signs: Reviewed and stable  Last Vitals:  Vitals Value Taken Time  BP 127/66 10/12/20 1150  Temp 36.2 C 10/12/20 1149  Pulse 60 10/12/20 1151  Resp 12 10/12/20 1151  SpO2 100 % 10/12/20 1151  Vitals shown include unvalidated device data.  Last Pain:  Vitals:   10/12/20 1149  TempSrc: Temporal  PainSc: Asleep      Patients Stated Pain Goal: 2 (15/37/94 3276)  Complications: No complications documented.

## 2020-10-12 NOTE — Anesthesia Procedure Notes (Signed)
Procedure Name: MAC Date/Time: 10/12/2020 9:45 AM Performed by: Griffin Dakin, CRNA Pre-anesthesia Checklist: Patient identified, Emergency Drugs available, Suction available, Patient being monitored and Timeout performed Patient Re-evaluated:Patient Re-evaluated prior to induction Oxygen Delivery Method: Simple face mask Induction Type: IV induction Placement Confirmation: breath sounds checked- equal and bilateral and positive ETCO2 Dental Injury: Teeth and Oropharynx as per pre-operative assessment

## 2020-10-12 NOTE — Progress Notes (Signed)
  Echocardiogram 2D Echocardiogram has been performed.  Kaitlyn [name] 10/12/2020, 11:30 AM

## 2020-10-12 NOTE — CV Procedure (Signed)
[facility]  TAVR OPERATIVE NOTE   Date of Procedure:  10/12/2020  Preoperative Diagnosis: Severe Aortic Stenosis   Postoperative Diagnosis: Same   Procedure:    Transcatheter Aortic Valve Replacement - Transfemoral Approach  Edwards Sapien 3 THV (size 23 mm, model # [id number], serial # [id number])   Co-Surgeons:  Lauree Chandler, MD and Gaye Pollack, MD   Anesthesiologist:  Hodierne  Echocardiographer:  Gasper Sells  Pre-operative Echo Findings:  Severe aortic stenosis  Normal left ventricular systolic function  Post-operative Echo Findings:  No paravalvular leak  Normal left ventricular systolic function  BRIEF CLINICAL NOTE AND INDICATIONS FOR SURGERY  80 yo female with history of paroxsymal atrial fibrillation, prior GI bleeding, arthritis, 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 She had a stroke and his issues with aspiration and gets choked easily when eating. She has paroxysmal atrial fibrillation and is on Eliquis. She had evidence of moderate aortic stenosis by echo in May 2021 with mean gradient of 21 mmHg. She was admitted to [facility] September 2021 with dyspnea. She was found to be in atrial fibrillation with RVR and LVEF was noted to be 35-40%. She was transferred to [facility]. TTE on 02/09/20 showed LVEF 55-60% with severely thickened leaflets with severely reduced leaflet excursion. There is moderately severe stenosis with peak and mean gradients across the AoV of 47 and 73mHg, AVA 0.55cm2. No evidence of left atrial appendage thrombus.  She was cardioverted to sinus rhythm. Right and left heart cath September 2021 with no evidence of CAD, normal filling pressures and likely moderate aortic stenosis. Echo 07/12/20 with LVEF=60-65%, moderate LVH, mild mitral regurgitation. The aortic valve has in indeterminate number of cusps. The aortic valve  leaflets are thickened and leaflet excursion is limited. Severe aortic valve stenosis with mean gradient 41 mmHg, peak gradient 65 mmHg, AVA 0.49 cm2, dimensionless index 0.17, SVI 32. She has had progressive fatigue and dyspnea. No chest pain.  During the course of the patient's preoperative work up they have been evaluated comprehensively by a multidisciplinary team [facility] specialists [facility].  They have been demonstrated to suffer from symptomatic severe aortic stenosis as noted above. The patient has been counseled extensively as to the relative risks and benefits of all options for the treatment of severe aortic stenosis including long term medical therapy, conventional surgery for aortic valve replacement, and transcatheter aortic valve replacement.  The patient has been independently evaluated by Dr. BCyndia Bentwith CT surgery and they are felt to be at high risk for conventional surgical aortic valve replacement. The surgeon indicated the patient would be a poor candidate for conventional surgery. Based upon review of all of the patient's preoperative diagnostic tests they are felt to be candidate for transcatheter aortic valve replacement using the transfemoral approach as an alternative to high risk conventional surgery.    Following the decision to proceed with transcatheter aortic valve replacement, a discussion has been held regarding what types of management strategies would be attempted intraoperatively in the event of life-threatening complications, including whether or not the patient would be considered a candidate for the use of cardiopulmonary bypass and/or conversion to open sternotomy for attempted surgical intervention.  The patient has been advised of a variety of complications that might develop peculiar to this approach including but not limited to risks of death, stroke, paravalvular  leak, aortic  dissection or other major vascular complications, aortic annulus rupture, device embolization, cardiac rupture or perforation, acute myocardial infarction, arrhythmia, heart block or bradycardia requiring permanent pacemaker placement, congestive heart failure, respiratory failure, renal failure, pneumonia, infection, other late complications related to structural valve deterioration or migration, or other complications that might ultimately cause a temporary or permanent loss of functional independence or other long term morbidity.  The patient provides full informed consent for the procedure as described and all questions were answered preoperatively.    DETAILS OF THE OPERATIVE PROCEDURE  PREPARATION:   The patient is brought to the operating room on the above mentioned date and central monitoring was established by the anesthesia team including placement of a radial arterial line. The patient is placed in the supine position on the operating table.  Intravenous antibiotics are administered. Conscious sedation is used.   Baseline transthoracic echocardiogram was performed. The patient's chest, abdomen, both groins, and both lower extremities are prepared and draped in a sterile manner. A time out procedure is performed.   PERIPHERAL ACCESS:   Using the modified Seldinger technique, femoral arterial and venous access were obtained with placement of a 6 Fr sheath in the artery and a 7 Fr sheath in the vein on the right side using u/s guidance.  A pigtail diagnostic catheter was passed through the femoral arterial sheath under fluoroscopic guidance into the aortic root.  A temporary transvenous pacemaker catheter was passed through the femoral venous sheath under fluoroscopic guidance into the right ventricle.  The pacemaker was tested to ensure stable lead placement and pacemaker capture. Aortic root angiography was performed in order to determine the optimal angiographic angle for valve  deployment.  TRANSFEMORAL ACCESS:  A micropuncture kit was used to gain access to the left femoral artery using u/s guidance. Position confirmed with angiography. Pre-closure with double ProGlide closure devices. The patient was heparinized systemically and ACT verified > 250 seconds.    A 14 Fr transfemoral E-sheath was introduced into the left femoral artery after progressively dilating over an Amplatz superstiff wire. An AL-1 catheter was used to direct a straight-tip exchange length wire across the native aortic valve into the left ventricle. This was exchanged out for a pigtail catheter and position was confirmed in the LV apex. Simultaneous LV and Ao pressures were recorded.  The pigtail catheter was then exchanged for an Amplatz Extra-stiff wire in the LV apex.   TRANSCATHETER HEART VALVE DEPLOYMENT:  An Edwards Sapien 3 THV (size 23 mm) was prepared and crimped per manufacturer's guidelines, and the proper orientation of the valve is confirmed on the [employer] delivery system. The valve was advanced through the introducer sheath using normal technique until in an appropriate position in the abdominal aorta beyond the sheath tip. The balloon was then retracted and using the fine-tuning wheel was centered on the valve. The valve was then advanced across the aortic arch using appropriate flexion of the catheter. The valve was carefully positioned across the aortic valve annulus. The Commander catheter was retracted using normal technique. Once final position of the valve has been confirmed by angiographic assessment, the valve is deployed while temporarily holding ventilation and during rapid ventricular pacing to maintain systolic blood pressure < 50 mmHg and pulse pressure < 10 mmHg. The balloon inflation is held for >3 seconds after reaching full deployment volume. Once the balloon has fully deflated the balloon is retracted into the ascending aorta and valve function is assessed using TTE.  There  is felt to be no paravalvular leak and no central aortic insufficiency.  The patient's hemodynamic recovery following valve deployment is good.  The deployment balloon and guidewire are both removed. Echo demostrated acceptable post-procedural gradients, stable mitral valve function, and no AI.   PROCEDURE COMPLETION:  The sheath was then removed and closure devices were completed. Protamine was administered once femoral arterial repair was complete. The temporary pacemaker, pigtail catheters and femoral sheaths were removed with a Mynx closure device placed in the artery and manual pressure used for venous hemostasis.    The patient tolerated the procedure well and is transported to the surgical intensive care in stable condition. There were no immediate intraoperative complications. All sponge instrument and needle counts are verified correct at completion of the operation.   No blood products were administered during the operation.  The patient received a total of 45 mL of intravenous contrast during the procedure.  Lauree Chandler MD 10/12/2020 11:55 AM

## 2020-10-12 NOTE — Op Note (Signed)
[facility]   MULTIDISCIPLINARY HEART VALVE TEAM   TAVR OPERATIVE NOTE   Date of Procedure:  10/12/2020  Preoperative Diagnosis: Severe Aortic Stenosis   Postoperative Diagnosis: Same   Procedure:    Transcatheter Aortic Valve Replacement - Percutaneous Left Transfemoral Approach  Edwards Sapien 3 Ultra THV (size 23 mm, model # [id number], serial # [id number])   Co-Surgeons:  Alleen Borne, MD and Verne Carrow, MD   Anesthesiologist:  A. Hodierne, MD  Echocardiographer:  Merlyn Lot  Pre-operative Echo Findings:  Severe aortic stenosis  Normal left ventricular systolic function  Post-operative Echo Findings:  No paravalvular leak  Normal left ventricular systolic function   BRIEF CLINICAL NOTE AND INDICATIONS FOR SURGERY  This 80 year old woman has stage D, severe, symptomatic aortic stenosis with near Heart Association class III symptoms of exertional fatigue and shortness of breath consistent with chronic diastolic congestive heart failure. I have personally reviewed her 2D echocardiogram, cardiac catheterization, and CTA studies. Her echocardiogram shows a severely calcified aortic valve with restricted leaflet mobility. There is an indeterminate number of cusps.The mean gradient is 41 mmHg with a valve area of 0.49 cm consistent with severe aortic stenosis. Her cardiac catheterization was done at an outside institution but I have reviewed the films and there is no significant coronary stenosis. She has a left dominant system. I agree that aortic valve replacement is indicated in this patient for relief of her symptoms and to prevent progressive left ventricular deterioration. 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 I think transcatheter aortic valve replacement would be the best treatment for her given her advanced age and limitations related to  her prior stroke.Her gated cardiac CTA shows a bicuspid aortic valve with fusion of the right and left cuspswith anatomy suitable for TAVR using a SAPIEN 3 valve. Her abdominal and pelvic CTA shows adequate pelvic vascular anatomy to allow transfemoral insertion.  The patientand her husband werecounseled at length regarding treatment alternatives for management of severe symptomatic aortic stenosis. The risks and benefits of surgical intervention has been discussed in detail. Long-term prognosis with medical therapy was discussed. Alternative approaches such as conventional surgical aortic valve replacement, transcatheter aortic valve replacement, and palliative medical therapy were compared and contrasted at length. This discussion was placed in the context of the patient's own specific clinical presentation and past medical history. All of their questions have been addressed.   Following the decision to proceed with transcatheter aortic valve replacement, a discussion was held regarding what types of management strategies would be attempted intraoperatively in the event of life-threatening complications, including whether or not the patient would be considered a candidate for the use of cardiopulmonary bypass and/or conversion to open sternotomy for attempted surgical intervention.I think she would be a candidate for emergent sternotomy to manage any intraoperative complications. The patient is aware of the fact that transient use of cardiopulmonary bypass may be necessary. The patient has been advised of a variety of complications that might develop including but not limited to risks of death, stroke, paravalvular leak, aortic dissection or other major vascular complications, aortic annulus rupture, device embolization, cardiac rupture or perforation, mitral regurgitation, acute myocardial infarction, arrhythmia, heart block or bradycardia requiring permanent pacemaker placement, congestive heart  failure, respiratory failure, renal failure, pneumonia, infection, other late complications related to structural valve deterioration or migration, or other complications that might ultimately cause a temporary or permanent  loss of functional independence or other long term morbidity. The patient provides full informed consent for the procedure as described and all questions were answered.     DETAILS OF THE OPERATIVE PROCEDURE  PREPARATION:    The patient was brought to the operating room on the above mentioned date and appropriate monitoring was established by the anesthesia team. The patient was placed in the supine position on the operating table.  Intravenous antibiotics were administered. The patient was monitored closely throughout the procedure under conscious sedation.    Baseline transthoracic echocardiogram was performed. The patient's abdomen and both groins were prepped and draped in a sterile manner. A time out procedure was performed.   PERIPHERAL ACCESS:    Using the modified Seldinger technique, femoral arterial and venous access was obtained with placement of 6 Fr sheaths on the right side.  A pigtail diagnostic catheter was passed through the right arterial sheath under fluoroscopic guidance into the aortic root.  A temporary transvenous pacemaker catheter was passed through the right femoral venous sheath under fluoroscopic guidance into the right ventricle.  The pacemaker was tested to ensure stable lead placement and pacemaker capture. Aortic root angiography was performed in order to determine the optimal angiographic angle for valve deployment.   TRANSFEMORAL ACCESS:   Percutaneous transfemoral access and sheath placement was performed using ultrasound guidance.  The left common femoral artery was cannulated using a micropuncture needle and appropriate location was verified using hand injection angiogram.  A pair of Abbott Perclose percutaneous closure devices were  placed and a 6 French sheath replaced into the femoral artery.  The patient was heparinized systemically and ACT verified > 250 seconds.    A 14 Fr transfemoral E-sheath was introduced into the left common femoral artery after progressively dilating over an Amplatz superstiff wire. An AL-1 catheter was used to direct a straight-tip exchange length wire across the native aortic valve into the left ventricle. This was exchanged out for a pigtail catheter and position was confirmed in the LV apex. Simultaneous LV and Ao pressures were recorded.  The pigtail catheter was exchanged for an Amplatz Extra-stiff wire in the LV apex.    BALLOON AORTIC VALVULOPLASTY:   Not performed  TRANSCATHETER HEART VALVE DEPLOYMENT:   An Edwards Sapien 3 Ultra transcatheter heart valve (size 23 mm) was prepared and crimped per manufacturer's guidelines, and the proper orientation of the valve is confirmed on the [employer] delivery system. The valve was advanced through the introducer sheath using normal technique until in an appropriate position in the abdominal aorta beyond the sheath tip. The balloon was then retracted and using the fine-tuning wheel was centered on the valve. The valve was then advanced across the aortic arch using appropriate flexion of the catheter. The valve was carefully positioned across the aortic valve annulus. The Commander catheter was retracted using normal technique. Once final position of the valve has been confirmed by angiographic assessment, the valve is deployed while temporarily holding ventilation and during rapid ventricular pacing to maintain systolic blood pressure < 50 mmHg and pulse pressure < 10 mmHg. The balloon inflation is held for >3 seconds after reaching full deployment volume. Once the balloon has fully deflated the balloon is retracted into the ascending aorta and valve function is assessed using echocardiography. There is felt to be no paravalvular leak and no central  aortic insufficiency. Post-procedural gradients were acceptable.  The patient's hemodynamic recovery following valve deployment is good.  The deployment balloon and guidewire are  both removed.    PROCEDURE COMPLETION:   The sheath was removed and femoral artery closure performed.  Protamine was administered once femoral arterial repair was complete. The temporary pacemaker, pigtail catheters and femoral sheaths were removed with manual pressure used for hemostasis.  A Mynx femoral closure device was utilized following removal of the diagnostic sheath in the right femoral artery.  The patient tolerated the procedure well and is transported to the cath lab recovery area in stable condition. There were no immediate intraoperative complications. All sponge instrument and needle counts are verified correct at completion of the operation.   No blood products were administered during the operation.  The patient received a total of 45 mL of intravenous contrast during the procedure.   Alleen Borne, MD 10/12/2020

## 2020-10-12 NOTE — Interval H&P Note (Signed)
History and Physical Interval Note:  10/12/2020 7:56 AM  Kaitlyn [name]  has presented today for surgery, with the diagnosis of Severe Aortic Stenosis.  The various methods of treatment have been discussed with the patient and family. After consideration of risks, benefits and other options for treatment, the patient has consented to  Procedure(s): TRANSCATHETER AORTIC VALVE REPLACEMENT, TRANSFEMORAL (N/A) TRANSESOPHAGEAL ECHOCARDIOGRAM (TEE) (N/A) as a surgical intervention.  The patient's history has been reviewed, patient examined, no change in status, stable for surgery.  I have reviewed the patient's chart and labs.  Questions were answered to the patient's satisfaction.     Alleen Borne

## 2020-10-12 NOTE — Discharge Instructions (Signed)
ACTIVITY AND EXERCISE °• Daily activity and exercise are an important part of your recovery. People recover at different rates depending on their general health and type of valve procedure. °• Most people recovering from TAVR feel better relatively quickly  °• No lifting, pushing, pulling more than 10 pounds (examples to avoid: groceries, vacuuming, gardening, golfing): °            - For one week with a procedure through the groin. °            - For six weeks for procedures through the chest wall or neck. °NOTE: You will typically see one of our providers 7-14 days after your procedure to discuss WHEN TO RESUME the above activities.  °  °  °DRIVING °• Do not drive until you are seen for follow up and cleared by a provider. Generally, we ask patient to not drive for 1 week after their procedure. °• If you have been told by your doctor in the past that you may not drive, you must talk with him/her before you begin driving again. °  °DRESSING °• Groin site: you may leave the clear dressing over the site for up to one week or until it falls off. °  °HYGIENE °• If you had a femoral (leg) procedure, you may take a shower when you return home. After the shower, pat the site dry. Do NOT use powder, oils or lotions in your groin area until the site has completely healed. °• If you had a chest procedure, you may shower when you return home unless specifically instructed not to by your discharging practitioner. °            - DO NOT scrub incision; pat dry with a towel. °            - DO NOT apply any lotions, oils, powders to the incision. °            - No tub baths / swimming for at least 2 weeks. °• If you notice any fevers, chills, increased pain, swelling, bleeding or pus, please contact your doctor. °  °ADDITIONAL INFORMATION °• If you are going to have an upcoming dental procedure, please contact our office as you will require antibiotics ahead of time to prevent infection on your heart valve.  ° ° °If you have any  questions or concerns you can call the structural heart phone during normal business hours 8am-4pm. If you have an urgent need after hours or weekends please call [phone] to talk to the on call provider for general cardiology. If you have an emergency that requires immediate attention, please call 911.  ° ° °After TAVR Checklist ° °Check  Test Description  ° Follow up appointment in 1-2 weeks  You will see our structural heart physician assistant, Katie Lachrista Heslin. Your incision sites will be checked and you will be cleared to drive and resume all normal activities if you are doing well.    ° 1 month echo and follow up  You will have an echo to check on your new heart valve and be seen back in the office by Katie Mileigh Tilley. Many times the echo is not read by your appointment time, but Katie will call you later that day or the following day to report your results.  ° Follow up with your primary cardiologist You will need to be seen by your primary cardiologist in the following 3-6 months after your 1 month appointment in the valve   clinic. Often times your Plavix or Aspirin will be discontinued during this time, but this is decided on a case by case basis.   ° 1 year echo and follow up You will have another echo to check on your heart valve after 1 year and be seen back in the office by Katie Josie Burleigh. This your last structural heart visit.  ° Bacterial endocarditis prophylaxis  You will have to take antibiotics for the rest of your life before all dental procedures (even teeth cleanings) to protect your heart valve. Antibiotics are also required before some surgeries. Please check with your cardiologist before scheduling any surgeries. Also, please make sure to tell us if you have a penicillin allergy as you will require an alternative antibiotic.   ° ° °

## 2020-10-12 NOTE — Anesthesia Preprocedure Evaluation (Signed)
Anesthesia Evaluation  Patient identified by MRN, date of birth, ID band Patient awake    Reviewed: Allergy & Precautions, H&P , NPO status , Patient's Chart, lab work & pertinent test results  Airway Mallampati: II   Neck ROM: full    Dental   Pulmonary Current Smoker and Patient abstained from smoking.,    breath sounds clear to auscultation       Cardiovascular hypertension, +CHF  + dysrhythmias Atrial Fibrillation + pacemaker + Valvular Problems/Murmurs AS  Rhythm:regular Rate:Normal     Neuro/Psych PSYCHIATRIC DISORDERS Depression CVA    GI/Hepatic   Endo/Other    Renal/GU      Musculoskeletal  (+) Arthritis ,   Abdominal   Peds  Hematology   Anesthesia Other Findings   Reproductive/Obstetrics                             Anesthesia Physical Anesthesia Plan  ASA: III  Anesthesia Plan: MAC   Post-op Pain Management:    Induction: Intravenous  PONV Risk Score and Plan: 1 and Ondansetron, Dexamethasone and Treatment may vary due to age or medical condition  Airway Management Planned: Simple Face Mask  Additional Equipment: Arterial line  Intra-op Plan:   Post-operative Plan:   Informed Consent: I have reviewed the patients History and Physical, chart, labs and discussed the procedure including the risks, benefits and alternatives for the proposed anesthesia with the patient or authorized representative who has indicated his/her understanding and acceptance.     Dental advisory given  Plan Discussed with: CRNA, Anesthesiologist and Surgeon  Anesthesia Plan Comments:         Anesthesia Quick Evaluation

## 2020-10-12 NOTE — Progress Notes (Signed)
Mobility Specialist: Progress Note   10/12/20 1622  Mobility  Activity Ambulated in hall  Level of Assistance Contact guard assist, steadying assist  Assistive Device Front wheel walker  Distance Ambulated (ft) 320 ft  Mobility Ambulated with assistance in hallway  Mobility Response Tolerated well  Mobility performed by Mobility specialist  $Mobility charge 1 Mobility   Pre-Mobility: 60 HR, 97% SpO2 Post-Mobility: 76 HR, 129/80 BP, 100% SpO2  Pt unable to make it to the BR to urinate upon standing so pt was assisted with pericare and changing socks. Pt to BR after clean up and the agreeable to walk, asx during ambulation. Pt to bed after walk, RN notified.   [facility] Mckinley Adelstein Mobility Specialist Mobility Specialist Phone: [phone]

## 2020-10-13 ENCOUNTER — Inpatient Hospital Stay (HOSPITAL_COMMUNITY): Payer: Medicare Other

## 2020-10-13 DIAGNOSIS — Z952 Presence of prosthetic heart valve: Secondary | ICD-10-CM

## 2020-10-13 DIAGNOSIS — Z006 Encounter for examination for normal comparison and control in clinical research program: Secondary | ICD-10-CM | POA: Diagnosis not present

## 2020-10-13 DIAGNOSIS — I4819 Other persistent atrial fibrillation: Secondary | ICD-10-CM | POA: Diagnosis not present

## 2020-10-13 DIAGNOSIS — I110 Hypertensive heart disease with heart failure: Secondary | ICD-10-CM | POA: Diagnosis not present

## 2020-10-13 DIAGNOSIS — Q231 Congenital insufficiency of aortic valve: Secondary | ICD-10-CM | POA: Diagnosis not present

## 2020-10-13 DIAGNOSIS — I5023 Acute on chronic systolic (congestive) heart failure: Secondary | ICD-10-CM

## 2020-10-13 LAB — CBC
HCT: 38.5 % (ref 36.0–46.0)
Hemoglobin: 12.4 g/dL (ref 12.0–15.0)
MCH: 32.6 pg (ref 26.0–34.0)
MCHC: 32.2 g/dL (ref 30.0–36.0)
MCV: 101.3 fL — ABNORMAL HIGH (ref 80.0–100.0)
Platelets: 168 10*3/uL (ref 150–400)
RBC: 3.8 MIL/uL — ABNORMAL LOW (ref 3.87–5.11)
RDW: 16.2 % — ABNORMAL HIGH (ref 11.5–15.5)
WBC: 7.6 10*3/uL (ref 4.0–10.5)
nRBC: 0 % (ref 0.0–0.2)

## 2020-10-13 LAB — BASIC METABOLIC PANEL
Anion gap: 5 (ref 5–15)
BUN: 13 mg/dL (ref 8–23)
CO2: 28 mmol/L (ref 22–32)
Calcium: 8.9 mg/dL (ref 8.9–10.3)
Chloride: 108 mmol/L (ref 98–111)
Creatinine, Ser: 1.04 mg/dL — ABNORMAL HIGH (ref 0.44–1.00)
GFR, Estimated: 55 mL/min — ABNORMAL LOW (ref >60)
Glucose, Bld: 111 mg/dL — ABNORMAL HIGH (ref 70–99)
Potassium: 4 mmol/L (ref 3.5–5.1)
Sodium: 141 mmol/L (ref 135–145)

## 2020-10-13 LAB — MAGNESIUM: Magnesium: 1.7 mg/dL (ref 1.7–2.4)

## 2020-10-13 MED ORDER — ASPIRIN 81 MG PO CHEW: 81.0000 mg | CHEWABLE_TABLET | Freq: Every day | ORAL | 1 refills | Status: DC

## 2020-10-13 MED FILL — Lidocaine HCl Local Inj 1%: INTRAMUSCULAR | Qty: 20 | Status: AC

## 2020-10-13 NOTE — Progress Notes (Signed)
CARDIAC REHAB PHASE I   PRE:  Rate/Rhythm: 66 SR  BP:  Sitting: 104/51      SaO2: 94 RA  MODE:  Ambulation: 300 ft   POST:  Rate/Rhythm: 97 SR  BP:  Sitting: 136/63    SaO2: 94 RA   Pt ambulated 370ft in hallway independently with slow, steady gait. Pt states improvement in breathing. Pt returned to bed. Reviewed site care, restrictions, and exercise guidelines. Pt decliner CRP II at this time. Anxious to go home, echo has been complete.  [id number] Reynold Bowen, RN BSN 10/13/2020 10:44 AM

## 2020-10-13 NOTE — Anesthesia Postprocedure Evaluation (Signed)
Anesthesia Post Note  Patient: Kaitlyn [name]  Procedure(s) Performed: TRANSCATHETER AORTIC VALVE REPLACEMENT, TRANSFEMORAL (N/A ) TRANSESOPHAGEAL ECHOCARDIOGRAM (TEE) (N/A )     Patient location during evaluation: Cath Lab Anesthesia Type: MAC Level of consciousness: awake and alert Pain management: pain level controlled Vital Signs Assessment: post-procedure vital signs reviewed and stable Respiratory status: spontaneous breathing, nonlabored ventilation, respiratory function stable and patient connected to nasal cannula oxygen Cardiovascular status: stable and blood pressure returned to baseline Postop Assessment: no apparent nausea or vomiting Anesthetic complications: no   No complications documented.  Last Vitals:  Vitals:   10/13/20 0300 10/13/20 0803  BP: 108/73 (!) 109/58  Pulse: 73 62  Resp: 19 20  Temp: 37.5 C 36.8 C  SpO2: 94% 94%    Last Pain:  Vitals:   10/13/20 0803  TempSrc: Oral  PainSc:                  [place]

## 2020-10-13 NOTE — Progress Notes (Signed)
  Echocardiogram 2D Echocardiogram has been performed.  [name] 10/13/2020, 10:20 AM

## 2020-10-13 NOTE — Progress Notes (Signed)
Patient given discharge instructions. Telemetry box removed, CCMD notified. PIV's removed. Personal belongings packed up and taken with patient in a wheelchair by staff to her vehicle.  Kenard Gower, RN

## 2020-10-13 NOTE — Discharge Summary (Addendum)
[facility]   MULTIDISCIPLINARY HEART VALVE TEAM  Discharge Summary    Patient ID: Kaitlyn [name] MRN: [id number]; DOB: February 28, 1941  Admit date: 10/12/2020 Discharge date: 10/13/2020  Primary Care Provider: Lezlie LyeSantiago Lago, Meda CoffeeIrma M, MD  Primary Cardiologist: Kristeen MissPhilip Nahser, MD / Dr. Clifton JamesMcAlhany & Dr. Laneta SimmersBartle (TAVR)  Discharge Diagnoses    Principal Problem:   S/P TAVR (transcatheter aortic valve replacement) Active Problems:   Essential hypertension   History of ischemic stroke   Prediabetes   Severe aortic stenosis   Pacemaker-MDT   Persistent atrial fibrillation (HCC)   History of GI bleed   Allergies Allergies  Allergen Reactions  . Lisinopril Cough         Diagnostic Studies/Procedures    TAVR OPERATIVE NOTE   Date of Procedure:                10/12/2020  Preoperative Diagnosis:      Severe Aortic Stenosis   Postoperative Diagnosis:    Same   Procedure:        Transcatheter Aortic Valve Replacement - Percutaneous Left Transfemoral Approach             Edwards Sapien 3 Ultra THV (size 23 mm, model # 9750TFX, serial # [id number])              Co-Surgeons:                        Alleen BorneBryan K. Bartle, MD and Verne Carrowhristopher Khya Halls, MD   Anesthesiologist:                  Fleeta EmmerA. Hodierne, MD  Echocardiographer:              Merlyn LotM. Chandrasekhar  Pre-operative Echo Findings: ? Severe aortic stenosis ? Normal left ventricular systolic function  Post-operative Echo Findings: ? No paravalvular leak ? Normal left ventricular systolic function  _____________   Echo 10/13/2020: complete but pending formal read at the time of discharge   History of Present Illness     Kaitlyn [name] is a 80 y.o. female with a history of paroxysmal atrial fibrillation (Eliquis & Amiodarone), prior GI bleeding, arthritis,CVA in January 2021 (memory issues), chronic aspiration, sick sinus syndrome s/p pacemaker implantation and severe aortic stenosis who [facility] for planned TAVR.  She suffered a stroke in January 2021 resulting in dysarthria and confusion. MRI showed a chronic hemorrhagic infarct in the right frontal lobe with surrounding subacute infarct.  She also had problems with acute hepatic encephalopathy at that time with elevated ammonia level.  2D echocardiogram in May 2021 showed a severely calcified aortic valve with severely restricted mobility.  The mean gradient was 21 mmHg with a peak gradient of 32 mmHg.  Aortic valve area by VTI was 0.48 cm.  Dimensionless index was 0.17.  Left ventricular ejection fraction was mildly decreased at 50% with global hypokinesis and mild LVH. [facility] with shortness of breath and found to be in atrial fibrillation with rapid ventricular response.  Echocardiogram at that time showed ejection fraction of 35 to 40%.  [facility] and had an echo on 02/09/2020 showing severely thickened aortic valve leaflets with severely restricted leaflet mobility.  The mean gradient was measured at 29 mmHg with an aortic valve area of 0.55 cm.  Ejection fraction was 55 to 60% at that time.  There  was no evidence of left atrial appendage thrombus and she was cardioverted to sinus rhythm.  She underwent cardiac catheterization in September 2021 that showed no evidence of coronary disease.    She was seen back by her primary cardiologist, Dr. Elease Hashimoto, and reported a history of progressive exertional fatigue and shortness of breath since around January. He referred her to the structural heart team for evaluation of progressive AS.   The patient has been evaluated by the multidisciplinary valve team and felt to have severe, symptomatic aortic stenosis and to be a suitable candidate for TAVR, which was set up for 10/12/2020.  Hospital Course     Consultants: none   Severe AS: s/p successful TAVR with a 23 mm Edwards Sapien 3 Ultra THV via the  TF approach on 10/12/2020. Post operative echo completed but pending formal read. Groin sites are stable. ECG with sinus and LBBB (has PPM) and no high grade heart block. Resumed on home Eliquis with the addition of a baby aspirin 81mg  daily x 6 months. Plan for discharge home today with close follow up in the office next week.  S/p PPM: followed by Dr.  PAF: maintaining sinus on amio. Resume home Eliquis tonight.   Chronic combined S/D CHF: appears euvolemic. Continue on home lasix. _____________  Discharge Vitals Blood pressure (!) 109/58, pulse 62, temperature 98.2 F (36.8 C), temperature source Oral, resp. rate 20, height 5\' 1"  (1.549 m), weight 58.4 kg, SpO2 94 %.  Filed Weights   10/12/20 0717 10/13/20 0300  Weight: 56.8 kg 58.4 kg    GEN: Well nourished, well developed, in no acute distress HEENT: normal Neck: no JVD or masses Cardiac: RRR; soft flow murmur. No rubs, or gallops,no edema  Respiratory:  clear to auscultation bilaterally, normal work of breathing GI: soft, nontender, nondistended, + BS MS: no deformity or atrophy Skin: warm and dry, no rash.  Groin sites clear without hematoma or ecchymosis  Neuro:  Alert and Oriented x 3, Strength and sensation are intact Psych: euthymic mood, full affect    Labs & Radiologic Studies    CBC Recent Labs    10/12/20 1206 10/13/20 0226  WBC  --  7.6  HGB 12.2 12.4  HCT 36.0 38.5  MCV  --  101.3*  PLT  --  168   Basic Metabolic Panel Recent Labs    10/14/20 1206 10/13/20 0226  NA 144 141  K 3.7 4.0  CL 106 108  CO2  --  28  GLUCOSE 129* 111*  BUN 11 13  CREATININE 0.60 1.04*  CALCIUM  --  8.9  MG  --  1.7   Liver Function Tests No results for input(s): AST, ALT, ALKPHOS, BILITOT, PROT, ALBUMIN in the last 72 hours. No results for input(s): LIPASE, AMYLASE in the last 72 hours. Cardiac Enzymes No results for input(s): CKTOTAL, CKMB, CKMBINDEX, TROPONINI in the last 72 hours. BNP Invalid input(s):  POCBNP D-Dimer No results for input(s): DDIMER in the last 72 hours. Hemoglobin A1C No results for input(s): HGBA1C in the last 72 hours. Fasting Lipid Panel No results for input(s): CHOL, HDL, LDLCALC, TRIG, CHOLHDL, LDLDIRECT in the last 72 hours. Thyroid Function Tests No results for input(s): TSH, T4TOTAL, T3FREE, THYROIDAB in the last 72 hours.  Invalid input(s): FREET3 _____________  DG Chest 2 View  Result Date: 10/09/2020 CLINICAL DATA:  80 year old female with preoperative chest radiograph for cardiac surgery EXAM: CHEST - 2 VIEW COMPARISON:  Chest radiograph dated 12/29/2019 FINDINGS: No  focal consolidation, pleural effusion, pneumothorax. Background of emphysema. There is cardiomegaly. Atherosclerotic calcification of the aorta. The aorta is tortuous. There is dilatation of the hilar vessels suggestive of pulmonary hypertension. Left pectoral pacemaker device. There is a small hiatal hernia. Left shoulder arthroplasty. Osteopenia with degenerative changes of the spine. No acute osseous pathology. IMPRESSION: 1. No acute cardiopulmonary process. 2. Emphysema with findings suggestive of pulmonary hypertension. 3. Cardiomegaly. Electronically Signed   By: Elgie Collard M.D.   On: 10/09/2020 22:49   US THYROID  Result Date: 09/15/2020 CLINICAL DATA:  Nodule on carotid study EXAM: THYROID ULTRASOUND TECHNIQUE: Ultrasound examination of the thyroid gland and adjacent soft tissues was performed. COMPARISON:  None. FINDINGS: Parenchymal Echotexture: Mildly heterogenous Isthmus: 0.4 cm thickness Right lobe: 4.1 x 2 x 1.6 cm Left lobe: 4 x 1.9 x 1.9 cm _________________________________________________________ Estimated total number of nodules >/= 1 cm: 0 Number of spongiform nodules >/=  2 cm not described below (TR1): 0 Number of mixed cystic and solid nodules >/= 1.5 cm not described below (TR2): 0 _________________________________________________________ Nodule # 1: Location: Right; mid  Maximum size: 0.9 cm; Other 2 dimensions: 0.8 x 0.5 cm Composition: solid/almost completely solid (2) Echogenicity: hypoechoic (2) Shape: not taller-than-wide (0) Margins: smooth (0) Echogenic foci: macrocalcifications (1) ACR TI-RADS total points: 5. ACR TI-RADS risk category: TR4 (4-6 points). ACR TI-RADS recommendations: Given size (<0.9 cm) and appearance, this nodule does NOT meet TI-RADS criteria for biopsy or dedicated follow-up. _________________________________________________________ Nodule # 2: 0.6 cm peripherally calcified nodule, mid left Nodule # 3: 0.7 cm coarsely calcified nodule, inferior left IMPRESSION: Normal-sized thyroid with subcentimeter calcified nodules. None meets criteria for biopsy or follow-up. The above is in keeping with the ACR TI-RADS recommendations - J Am Coll Radiol 2017;14:587-595. Electronically Signed   By: Corlis Leak M.D.   On: 09/15/2020 18:27   ECHOCARDIOGRAM LIMITED  Result Date: 10/12/2020    ECHOCARDIOGRAM LIMITED REPORT   Patient Name:   Kaitlyn [name] Date of Exam: 10/12/2020 Medical Rec #:  [id number]     Height:       61.0 in Accession #:    [id number]    Weight:       125.2 lb Date of Birth:  10-10-1940     BSA:          1.548 m Patient Age:    80 years      BP:           193/84 mmHg Patient Gender: F             HR:           61 bpm. Exam Location:  Inpatient Procedure: Limited Echo, Cardiac Doppler and Color Doppler Indications:     I35.0 Nonrheumatic aortic (valve) stenosis  History:         Patient has prior history of Echocardiogram examinations, most                  recent 07/12/2020. CHF, Abnormal ECG and Pacemaker, Stroke,                  Aortic Valve Disease, Arrythmias:Atrial Fibrillation; Risk                  Factors:Hypertension. Severe aortic stenosis.  Sonographer:     Sheralyn Boatman RDCS Referring Phys:  [id number] Kathleene Hazel Diagnosing Phys: Riley Lam MD  Sonographer Comments: TAVR procedure using 27mm Edwards Sapien valve. IMPRESSIONS   1. Left  ventricular ejection fraction, by estimation, is 55 to 60%. The left ventricle has normal function. The left ventricle has no regional wall motion abnormalities. There is moderate concentric left ventricular hypertrophy.  2. Right ventricular systolic function is normal. The right ventricular size is normal.  3. No evidence of mitral valve regurgitation.  4. 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 There is severe calcifcation of the aortic valve. Aortic valve regurgitation is not visualized.  5. Through procedure: placement of a 23 mm Edwards Sapien Valve with small PVL best seen in the five chamber and with resolution of elevated aortic valve gradietns (post procedural mean gradient of 2 mm Hg. FINDINGS  Left Ventricle: Left ventricular ejection fraction, by estimation, is 55 to 60%. The left ventricle has normal function. The left ventricle has no regional wall motion abnormalities. The left ventricular internal cavity size was small. There is moderate  concentric left ventricular hypertrophy. Right Ventricle: The right ventricular size is normal. Right ventricular systolic function is normal. Pericardium: Trivial pericardial effusion is present. Tricuspid Valve: Tricuspid valve regurgitation is mild. Aortic Valve: Small PVL. 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 There is severe calcifcation of the aortic valve. Aortic valve regurgitation is not visualized. Aortic valve mean gradient measures 21.0 mmHg. Aortic valve peak gradient measures 25.9 mmHg. Aortic valve area, by VTI measures 0.71 cm. There is a 23 mm Sapien prosthetic, stented (TAVR) valve present in the aortic position. Additional Comments: A device lead is visualized. LEFT VENTRICLE PLAX 2D LVIDd:         3.50 cm LV PW:         1.90 cm LV IVS:        1.50 cm LVOT diam:     1.90 cm LV SV:         49 LV SV Index:   32 LVOT Area:     2.84 cm  LV Volumes (MOD) LV vol d, MOD A2C: 43.9 ml LV vol d, MOD A4C: 42.1 ml LV vol s, MOD A2C: 18.5 ml LV vol s, MOD A4C: 19.6  ml LV SV MOD A2C:     25.4 ml LV SV MOD A4C:     42.1 ml LV SV MOD BP:      25.0 ml AORTIC VALVE AV Area (Vmax):    0.83 cm AV Area (Vmean):   0.76 cm AV Area (VTI):     0.71 cm AV Vmax:           254.50 cm/s AV Vmean:          186.750 cm/s AV VTI:            0.684 m AV Peak Grad:      25.9 mmHg AV Mean Grad:      21.0 mmHg LVOT Vmax:         74.10 cm/s LVOT Vmean:        50.100 cm/s LVOT VTI:          0.172 m LVOT/AV VTI ratio: 0.25  SHUNTS Systemic VTI:  0.17 m Systemic Diam: 1.90 cm Riley Lam MD Electronically signed by Riley Lam MD Signature Date/Time: 10/12/2020/12:37:41 PM    Final    Structural Heart Procedure  Result Date: 10/12/2020 See surgical note for result.  Disposition   Pt is being discharged home today in good condition.  Follow-up Plans & Appointments     Follow-up Information    Janetta Hora, PA-C. Go on 10/20/2020.   Specialties: Cardiology, Radiology Why: @ 2:30pm, please arrive  at least 10 minutes early.  Contact information: [address] [place] [address] [phone]                Discharge Medications   Allergies as of 10/13/2020      Reactions   Lisinopril Cough         Medication List    TAKE these medications   albuterol 108 (90 Base) MCG/ACT inhaler Commonly known as: VENTOLIN HFA Inhale 2 puffs into the lungs every 6 (six) hours as needed for wheezing or shortness of breath.   amiodarone 200 MG tablet Commonly known as: PACERONE Take 0.5 tablets (100 mg total) by mouth daily.   apixaban 5 MG Tabs tablet Commonly known as: ELIQUIS Take 1 tablet (5 mg total) by mouth 2 (two) times daily.   aspirin 81 MG chewable tablet Chew 1 tablet (81 mg total) by mouth daily. Start taking on: Oct 14, 2020   cholecalciferol 25 MCG (1000 UNIT) tablet Commonly known as: VITAMIN D3 Take 1,000 Units by mouth daily.   Co Q-10 200 MG Caps Take 200 mg by mouth daily.   ferrous sulfate 325 (65 FE)  MG tablet Take 325 mg by mouth daily with breakfast.   furosemide 20 MG tablet Commonly known as: LASIX Take 1 tablet (20 mg total) by mouth in the morning.   Magnesium 400 MG Tabs Take 400 mg by mouth daily.   potassium chloride SA 20 MEQ tablet Commonly known as: KLOR-CON Take 1 tablet (20 mEq total) by mouth daily.   rOPINIRole 1 MG tablet Commonly known as: REQUIP Take 1 tablet (1 mg total) by mouth at bedtime.        Outstanding Labs/Studies  none  Duration of Discharge Encounter   Greater than 30 minutes including physician time.  Signed, Cline Crock, PA-C 10/13/2020, 11:47 AM [phone]   I have personally seen and examined this patient. I agree with the assessment and plan as outlined above.  She is doing well. BP stable. Paced rhythm. Labs ok. D/c home today.   Verne Carrow 10/13/2020 7:17 PM

## 2020-10-14 ENCOUNTER — Telehealth: Payer: Self-pay | Admitting: Physician Assistant

## 2020-10-14 ENCOUNTER — Other Ambulatory Visit: Payer: Self-pay | Admitting: Cardiovascular Disease

## 2020-10-14 LAB — CUP PACEART REMOTE DEVICE CHECK
Battery Remaining Longevity: 155 mo
Battery Voltage: 3.09 V
Brady Statistic AP VP Percent: 0.08 %
Brady Statistic AP VS Percent: 83.45 %
Brady Statistic AS VP Percent: 0.03 %
Brady Statistic AS VS Percent: 16.45 %
Brady Statistic RA Percent Paced: 83.57 %
Brady Statistic RV Percent Paced: 0.11 %
Date Time Interrogation Session: 20220518160742
Implantable Lead Implant Date: 20091201
Implantable Lead Implant Date: 20091209
Implantable Lead Location: 753859
Implantable Lead Location: 753860
Implantable Lead Model: 4076
Implantable Lead Model: 4076
Implantable Pulse Generator Implant Date: 20210521
Lead Channel Impedance Value: 285 Ohm
Lead Channel Impedance Value: 342 Ohm
Lead Channel Impedance Value: 475 Ohm
Lead Channel Impedance Value: 532 Ohm
Lead Channel Pacing Threshold Amplitude: 0.5 V
Lead Channel Pacing Threshold Amplitude: 0.625 V
Lead Channel Pacing Threshold Pulse Width: 0.4 ms
Lead Channel Pacing Threshold Pulse Width: 0.4 ms
Lead Channel Sensing Intrinsic Amplitude: 1.375 mV
Lead Channel Sensing Intrinsic Amplitude: 1.375 mV
Lead Channel Sensing Intrinsic Amplitude: 10 mV
Lead Channel Sensing Intrinsic Amplitude: 10 mV
Lead Channel Setting Pacing Amplitude: 1.5 V
Lead Channel Setting Pacing Amplitude: 2.5 V
Lead Channel Setting Pacing Pulse Width: 0.4 ms
Lead Channel Setting Sensing Sensitivity: 5.6 mV

## 2020-10-14 NOTE — Telephone Encounter (Signed)
  [facility]ENTER   MULTIDISCIPLINARY HEART VALVE TEAM  Attempted TOC call. Left VM.   Farron Watrous PA-C  [facility]     

## 2020-10-15 ENCOUNTER — Ambulatory Visit (INDEPENDENT_AMBULATORY_CARE_PROVIDER_SITE_OTHER): Payer: Medicare Other

## 2020-10-15 DIAGNOSIS — I495 Sick sinus syndrome: Secondary | ICD-10-CM | POA: Diagnosis not present

## 2020-10-20 ENCOUNTER — Encounter: Payer: Self-pay | Admitting: Physician Assistant

## 2020-10-20 ENCOUNTER — Other Ambulatory Visit: Payer: Self-pay

## 2020-10-20 ENCOUNTER — Ambulatory Visit (INDEPENDENT_AMBULATORY_CARE_PROVIDER_SITE_OTHER): Payer: Medicare Other | Admitting: Physician Assistant

## 2020-10-20 VITALS — BP 102/68 | HR 80 | Ht 61.0 in | Wt 126.4 lb

## 2020-10-20 DIAGNOSIS — Z950 Presence of cardiac pacemaker: Secondary | ICD-10-CM | POA: Diagnosis not present

## 2020-10-20 DIAGNOSIS — I5032 Chronic diastolic (congestive) heart failure: Secondary | ICD-10-CM

## 2020-10-20 DIAGNOSIS — Z952 Presence of prosthetic heart valve: Secondary | ICD-10-CM

## 2020-10-20 DIAGNOSIS — I4819 Other persistent atrial fibrillation: Secondary | ICD-10-CM | POA: Diagnosis not present

## 2020-10-20 LAB — ECHOCARDIOGRAM COMPLETE
AR max vel: 1.03 cm2
AV Area VTI: 1.1 cm2
AV Area mean vel: 1.11 cm2
AV Mean grad: 12.7 mmHg
AV Peak grad: 23.2 mmHg
Ao pk vel: 2.41 m/s
Area-P 1/2: 2.32 cm2
Height: 61 in
S' Lateral: 2.7 cm
Weight: 2060.8 oz

## 2020-10-20 MED ORDER — AMOXICILLIN 500 MG PO TABS: ORAL_TABLET | ORAL | 12 refills | Status: DC

## 2020-10-20 NOTE — Patient Instructions (Signed)
Medication Instructions:  Your provider discussed the importance of taking an antibiotic prior to all dental visits to prevent damage to the heart valves from infection. You were given a prescription for AMOXICILLIN 2,000 mg to take one hour prior to any dental appointment.  *If you need a refill on your cardiac medications before your next appointment, please call your pharmacy*  Follow-Up: Please keep your appointments as scheduled! 

## 2020-10-20 NOTE — Progress Notes (Signed)
[facility]ASC[facility]ENTER   MULTIDISCIPLINARY HEART VALVE CLINIC                                     Cardiology Office Note:    Date:  10/21/2020   ID:  Kaitlyn [name], DOB 09-22-1940, MRN [id number]  PCP:  Lezlie LyeSantiago Lago, Meda CoffeeIrma M, MD  [facility] Cardiologist:  Kristeen MissPhilip Nahser, MD / Dr. Clifton JamesMcAlhany & Dr. Laneta SimmersBartle (TAVR) [facility] Electrophysiologist:  Sherryl MangesSteven Klein, MD   Referring MD: Lezlie LyeSantiago Lago, Meda CoffeeIrma M, *   [facility]  History of Present Illness:    Kaitlyn [name] is a 80 y.o. 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   She suffered a stroke in January 2021 resulting in dysarthria and confusion. MRI showed a chronic hemorrhagic infarct in the right frontal lobe with surrounding subacute infarct. She also had problems with acute hepatic encephalopathy at that time with elevated ammonia level. 2D echocardiogram in May 2021 showed a severely calcified aortic valve with severely restricted mobility. The mean gradient was 21 mmHg with a peak gradient of 32 mmHg. Aortic valve area by VTI was 0.48 cm. Dimensionless index was 0.17. Left ventricular ejection fraction was mildly decreased at 50% with global hypokinesis and mild LVH. She was admitted to [facility] in September 2021 with shortness of breath and found to be in atrial fibrillation with rapid ventricular response. Echocardiogram at that time showed ejection fraction of 35 to 40%. [facility] and had an echo on 02/09/2020 showing severely thickened aortic valve leaflets with severely restricted leaflet mobility. The mean gradient was measured at 29 mmHg with an aortic valve area of 0.55 cm. Ejection fraction was 55 to 60% at that  time. There was no evidence of left atrial appendage thrombus and she was cardioverted to sinus rhythm. She underwent cardiac catheterization in September 2021 that showed no evidence of coronary disease. Repeat echo in 06/2020 showed EF 60-65%, moderate LVH, and severe AS with a mean gradient of 41 mm Hg and AVA 0.49cm2 & DVI 0.17. The patient was seen back by Dr. Elease HashimotoNahser and reported a history of progressive exertional fatigue and shortness of breath since around January.   Shee was evaluated by the multidisciplinary valve team and underwent successful TAVR with a 23 mm Edwards Sapien 3 Ultra THV via the TF approach on 10/12/2020. Post operative echo showed EF 60%, normally functioning TAVR with a mean gradient of 12.7 mmHg and no PVL. She was resumed on home Eliquis with the addition of a baby aspirin 81mg  daily x 6 months.   Today she presents to clinic for follow up. Husband is with her today. Has had some chest congestion and cough but overall doing well. She has had a marked improvement in her symptoms since TAVR. No CP or SOB. No LE edema, orthopnea or PND. No dizziness or syncope. No blood in stool or urine. No palpitations. 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  Past Medical History:  Diagnosis Date  . Arthritis    both knees  . Chronic pain   . History of GI bleed   .  History of stroke 05/2019  . Persistent atrial fibrillation (HCC)   . Presence of permanent cardiac pacemaker    placed for SSS- Medtronic device   . S/P TAVR (transcatheter aortic valve replacement) 10/12/2020   s/p TAVR with a 68mm Edwards S3U via the TF approach by Dr. Clifton James & Dr. Laneta Simmers   . Severe aortic stenosis     Past Surgical History:  Procedure Laterality Date  . CERVICAL SPINE SURGERY    . EYE SURGERY Left   . PACEMAKER IMPLANT    . PPM GENERATOR CHANGEOUT N/A 10/17/2019   Procedure: PPM GENERATOR CHANGEOUT;  Surgeon: Duke Salvia, MD;  Location: [facility] INVASIVE  CV LAB;  Service: Cardiovascular;  Laterality: N/A;  . shoulder replacement Left   . TEE WITHOUT CARDIOVERSION N/A 10/12/2020   Procedure: TRANSESOPHAGEAL ECHOCARDIOGRAM (TEE);  Surgeon: Kathleene Hazel, MD;  Location: [facility] INVASIVE CV LAB;  Service: Open Heart Surgery;  Laterality: N/A;  . TRANSCATHETER AORTIC VALVE REPLACEMENT, TRANSFEMORAL N/A 10/12/2020   Procedure: TRANSCATHETER AORTIC VALVE REPLACEMENT, TRANSFEMORAL;  Surgeon: Kathleene Hazel, MD;  Location: [facility] INVASIVE CV LAB;  Service: Open Heart Surgery;  Laterality: N/A;    Current Medications: Current Meds  Medication Sig  . albuterol (VENTOLIN HFA) 108 (90 Base) MCG/ACT inhaler Inhale 2 puffs into the lungs every 6 (six) hours as needed for wheezing or shortness of breath.  Marland Kitchen amiodarone (PACERONE) 200 MG tablet Take 0.5 tablets (100 mg total) by mouth daily.  Marland Kitchen amoxicillin (AMOXIL) 500 MG tablet Take 4 tablets (2,000 mg) one hour prior to all dental visits.  Marland Kitchen apixaban (ELIQUIS) 5 MG TABS tablet Take 1 tablet (5 mg total) by mouth 2 (two) times daily.  Marland Kitchen aspirin 81 MG chewable tablet Chew 1 tablet (81 mg total) by mouth daily.  . cholecalciferol (VITAMIN D3) 25 MCG (1000 UNIT) tablet Take 1,000 Units by mouth daily.  . Coenzyme Q10 (CO Q-10) 200 MG CAPS Take 200 mg by mouth daily.  . ferrous sulfate 325 (65 FE) MG tablet Take 325 mg by mouth daily with breakfast.  . furosemide (LASIX) 20 MG tablet Take 1 tablet (20 mg total) by mouth in the morning.  . Magnesium 400 MG TABS Take 400 mg by mouth daily.  . potassium chloride SA (KLOR-CON) 20 MEQ tablet Take 1 tablet (20 mEq total) by mouth daily.  . [DISCONTINUED] rOPINIRole (REQUIP) 1 MG tablet Take 1 tablet (1 mg total) by mouth at bedtime.     Allergies:   Lisinopril   Social History   Socioeconomic History  . Marital status: Married    Spouse name: Not on file  . Number of children: 0  . Years of education: Not on file  . Highest education level: Not on  file  Occupational History  . Occupation: Airline pilot  Tobacco Use  . Smoking status: Current Every Day Smoker    Types: E-cigarettes  . Smokeless tobacco: Never Used  . Tobacco comment: vaping all day  Vaping Use  . Vaping Use: Every day  . Start date: 10/27/2013  Substance and Sexual Activity  . Alcohol use: Never  . Drug use: Never  . Sexual activity: Not on file  Other Topics Concern  . Not on file  Social History Narrative  . Not on file   Social Determinants of Health   Financial Resource Strain: Not on file  Food Insecurity: Not on file  Transportation Needs: Not on file  Physical Activity: Not on file  Stress: Not on  file  Social Connections: Not on file     Family History: The patient's family history includes Alzheimer's disease in her mother; Stroke in her father.  ROS:   Please see the history of present illness.    All other systems reviewed and are negative.  EKGs/Labs/Other Studies Reviewed:    The following studies were reviewed today:   TAVR OPERATIVE NOTE   Date of Procedure:10/12/2020  Preoperative Diagnosis:Severe Aortic Stenosis   Postoperative Diagnosis:Same   Procedure:   Transcatheter Aortic Valve Replacement - PercutaneousLeftTransfemoral Approach Edwards Sapien 3 Ultra THV (size 44mm, model # I1735201, serial # [id number])  Co-Surgeons:Bryan Jennefer Bravo, MD and Verne Carrow, MD   Anesthesiologist:A. Hodierne, MD  Echocardiographer:M. Chandrasekhar  Pre-operative Echo Findings: ? Severe aortic stenosis ? Normalleft ventricular systolic function  Post-operative Echo Findings: ? Noparavalvular leak ? Normalleft ventricular systolic function  _____________   Echo 10/13/2020: pending read   EKG:  EKG is  ordered today.  The ekg ordered today demonstrates afib with LBBB,  HR 80  Recent  Labs: 04/28/2020: TSH 1.349 10/08/2020: ALT 23 10/13/2020: BUN 13; Creatinine, Ser 1.04; Hemoglobin 12.4; Magnesium 1.7; Platelets 168; Potassium 4.0; Sodium 141  Recent Lipid Panel    Component Value Date/Time   CHOL 139 11/13/2019 1136   TRIG 113 11/13/2019 1136   HDL 44 11/13/2019 1136   CHOLHDL 3.2 11/13/2019 1136   LDLCALC 74 11/13/2019 1136     Risk Assessment/Calculations:       Physical Exam:    VS:  BP 102/68   Pulse 80   Ht 5\' 1"  (1.549 m)   Wt 126 lb 6.4 oz (57.3 kg)   LMP  (LMP Unknown)   SpO2 97%   BMI 23.88 kg/m     Wt Readings from Last 3 Encounters:  10/20/20 126 lb 6.4 oz (57.3 kg)  10/13/20 128 lb 12.8 oz (58.4 kg)  10/08/20 125 lb 4 oz (56.8 kg)     GEN:  Well nourished, well developed in no acute distress HEENT: Normal NECK: No JVD; No carotid bruits LYMPHATICS: No lymphadenopathy CARDIAC: RRR, no murmurs, rubs, gallops RESPIRATORY:  Clear to auscultation without rales, wheezing or rhonchi  ABDOMEN: Soft, non-tender, non-distended MUSCULOSKELETAL:  No edema; No deformity  SKIN: Warm and dry.  Groin sites clear without hematoma or ecchymosis  NEUROLOGIC:  Alert and oriented x 3 PSYCHIATRIC:  Normal affect   ASSESSMENT:    1. S/P TAVR (transcatheter aortic valve replacement)   2. S/P placement of cardiac pacemaker   3. Persistent atrial fibrillation (HCC)   4. Chronic diastolic CHF (congestive heart failure) (HCC)    PLAN:    In order of problems listed above:  Severe AS s/p TAVR:doing great with a marked improvement in symptoms since TAVR. Groin site healing well. Continue on Eliquis and aspirin. SBE prophylaxis discussed; I have RX'd amoxicillin. I will see her back for 1 month echo and follow up.  S/p PPM: followed by Dr. 10/10/20  Persistent afib: had been maintaining sinus during admission. On amio and Eliquis. However, ECG looks like afib today. Will continue to monitor. If she remains in afib, may want to consider stopping  amiodarone.   Medication Adjustments/Labs and Tests Ordered: Current medicines are reviewed at length with the patient today.  Concerns regarding medicines are outlined above.  No orders of the defined types were placed in this encounter.  Meds ordered this encounter  Medications  . amoxicillin (AMOXIL) 500 MG tablet    Sig: Take 4  tablets (2,000 mg) one hour prior to all dental visits.    Dispense:  12 tablet    Refill:  12    Patient Instructions  Medication Instructions:  Your provider discussed the importance of taking an antibiotic prior to all dental visits to prevent damage to the heart valves from infection. You were given a prescription for AMOXICILLIN 2,000 mg to take one hour prior to any dental appointment.  *If you need a refill on your cardiac medications before your next appointment, please call your pharmacy*\  Follow-Up: Please keep your appointments as scheduled!    Signed, Cline Crock, PA-C  10/21/2020 9:08 AM    [facility]

## 2020-10-29 NOTE — Addendum Note (Signed)
Addended by: Winifred Olive on: 10/29/2020 12:59 PM   Modules accepted: Orders

## 2020-11-01 NOTE — Progress Notes (Signed)
Remote pacemaker transmission.   

## 2020-11-24 ENCOUNTER — Encounter: Payer: Self-pay | Admitting: Physician Assistant

## 2020-11-24 ENCOUNTER — Ambulatory Visit (INDEPENDENT_AMBULATORY_CARE_PROVIDER_SITE_OTHER): Payer: Medicare Other | Admitting: Physician Assistant

## 2020-11-24 ENCOUNTER — Other Ambulatory Visit: Payer: Self-pay

## 2020-11-24 VITALS — BP 142/82 | HR 86 | Ht 61.0 in | Wt 122.4 lb

## 2020-11-24 DIAGNOSIS — I480 Paroxysmal atrial fibrillation: Secondary | ICD-10-CM

## 2020-11-24 DIAGNOSIS — I10 Essential (primary) hypertension: Secondary | ICD-10-CM

## 2020-11-24 DIAGNOSIS — Z952 Presence of prosthetic heart valve: Secondary | ICD-10-CM

## 2020-11-24 DIAGNOSIS — Z950 Presence of cardiac pacemaker: Secondary | ICD-10-CM

## 2020-11-24 DIAGNOSIS — I4819 Other persistent atrial fibrillation: Secondary | ICD-10-CM

## 2020-11-24 LAB — CUP PACEART INCLINIC DEVICE CHECK
Battery Remaining Longevity: 154 mo
Battery Voltage: 3.07 V
Brady Statistic AP VP Percent: 0.12 %
Brady Statistic AP VS Percent: 86.17 %
Brady Statistic AS VP Percent: 0.01 %
Brady Statistic AS VS Percent: 13.71 %
Brady Statistic RA Percent Paced: 82.37 %
Brady Statistic RV Percent Paced: 0.55 %
Date Time Interrogation Session: 20220629170040
Implantable Lead Implant Date: 20091201
Implantable Lead Implant Date: 20091209
Implantable Lead Location: 753859
Implantable Lead Location: 753860
Implantable Lead Model: 4076
Implantable Lead Model: 4076
Implantable Pulse Generator Implant Date: 20210521
Lead Channel Impedance Value: 304 Ohm
Lead Channel Impedance Value: 361 Ohm
Lead Channel Impedance Value: 684 Ohm
Lead Channel Impedance Value: 741 Ohm
Lead Channel Pacing Threshold Amplitude: 0.5 V
Lead Channel Pacing Threshold Amplitude: 0.625 V
Lead Channel Pacing Threshold Pulse Width: 0.4 ms
Lead Channel Pacing Threshold Pulse Width: 0.4 ms
Lead Channel Sensing Intrinsic Amplitude: 10.375 mV
Lead Channel Sensing Intrinsic Amplitude: 18.375 mV
Lead Channel Sensing Intrinsic Amplitude: 3 mV
Lead Channel Sensing Intrinsic Amplitude: 3 mV
Lead Channel Setting Pacing Amplitude: 1.5 V
Lead Channel Setting Pacing Amplitude: 2.5 V
Lead Channel Setting Pacing Pulse Width: 0.4 ms
Lead Channel Setting Sensing Sensitivity: 5.6 mV

## 2020-11-24 MED ORDER — AMIODARONE HCL 200 MG PO TABS: 100.0000 mg | ORAL_TABLET | Freq: Every day | ORAL | 1 refills | Status: DC

## 2020-11-24 NOTE — Patient Instructions (Addendum)
Medication Instructions:    START TAKING AMIODARONE  HALF OF 200 MG ( 100 MG ) ONCE A DAY AND NONE ON WEEKENDS      *If you need a refill on your cardiac medications before your next appointment, please call your pharmacy*   Lab Work: NONE ORDERED  TODAY     If you have labs (blood work) drawn today and your tests are completely normal, you will receive your results only by: MyChart Message (if you have MyChart) OR A paper copy in the mail If you have any lab test that is abnormal or we need to change your treatment, we will call you to review the results.   Testing/Procedures: NONE ORDERED  TODAY    Follow-Up: At [facility], you and your health needs are our priority.  As part of our continuing mission to provide you with exceptional heart care, we have created designated Provider Care Teams.  These Care Teams include your primary Cardiologist (physician) and Advanced Practice Providers (APPs -  Physician Assistants and Nurse Practitioners) who all work together to provide you with the care you need, when you need it.  We recommend signing up for the patient portal called "MyChart".  Sign up information is provided on this After Visit Summary.  MyChart is used to connect with patients for Virtual Visits (Telemedicine).  Patients are able to view lab/test results, encounter notes, upcoming appointments, etc.  Non-urgent messages can be sent to your provider as well.   To learn more about what you can do with MyChart, go to [email or web address].    Your next appointment:   6 month(s)  The format for your next appointment:   In Person  Provider:   You may see Sherryl Manges, MD or one of the following Advanced Practice Providers on your designated Care Team:   Gypsy Balsam, NP Francis Dowse, [place] Casimiro Needle "Otilio Saber, [place]   Other Instructions

## 2020-11-24 NOTE — Progress Notes (Signed)
Cardiology Office Note Date:  11/24/2020  Patient ID:  Kaitlyn [name], DOB [birth year], MRN [id number] PCP:  [name], [name], MD  Cardiologist:  previously Dr. Wynonia Hazard, [facility] in [place]) >> Dr. Elease Hashimoto EP: new to Dr. Graciela Husbands Structural heart: Dr. Clifton James   Chief Complaint:   78mo post AFib clinic   History of Present Illness: Kaitlyn [name] is a 80 y.o. female with history of  CVA (while off a/c 2/2 GIB), AFib, remote DVT, HTN, SSSx w/PPM, GIB, VHD w/ AS >> s/p TAVR 10/12/20, prior cardiology mentions VSD (?).   She comes today to be seen for Dr. Graciela Husbands, last seen by him during her stay/gen change hospitalization may 2021  She saw R fenton, PA 08/24/20, maintaining SR and her amiodarone reduced to 100mg  daily and rec to be seen in 59mo  Most recently she saw K. 0mo, Janee Morn 10/20/20 post TAVR, doing and feeling better post TAVR, she was in AFib, mentions maintained SR through her TAVR hospitalization.  Thoughts towards stopping amio of remained in AF  TODAY She is feeling "10/22/20!" She is so happy with the results of her TAVR and has started to walk n her treamill again, slow and only a little bit but has moved from 10 minutes to 12. No CP, minimal DOE with her treadmill walks and none with her usual/casual ADLs. No dizzy spells, near syncope or syncope.  She has not had any palpitations or cardiac awareness She inquires about getting off the amiodarone, she is worried about the side effects No bleeding or signs of bleeding She is very compliant with her medicines   Device information MDT dual chamber PPM implanted 04/28/2008 >> Gen change 10/17/19  Past Medical History:  Diagnosis Date   Arthritis    both knees   Chronic pain    History of GI bleed    History of stroke 05/2019   Persistent atrial fibrillation (HCC)    Presence of permanent cardiac pacemaker    placed for SSS- Medtronic device    S/P TAVR (transcatheter aortic valve replacement) 10/12/2020   s/p TAVR  with a 11mm Edwards S3U via the TF approach by Dr. 37m & Dr. Clifton James    Severe aortic stenosis     Past Surgical History:  Procedure Laterality Date   CERVICAL SPINE SURGERY     EYE SURGERY Left    PACEMAKER IMPLANT     PPM GENERATOR CHANGEOUT N/A 10/17/2019   Procedure: PPM GENERATOR CHANGEOUT;  Surgeon: 10/19/2019, MD;  Location: [facility] INVASIVE CV LAB;  Service: Cardiovascular;  Laterality: N/A;   shoulder replacement Left    TEE WITHOUT CARDIOVERSION N/A 10/12/2020   Procedure: TRANSESOPHAGEAL ECHOCARDIOGRAM (TEE);  Surgeon: 10/14/2020, MD;  Location: [facility] INVASIVE CV LAB;  Service: Open Heart Surgery;  Laterality: N/A;   TRANSCATHETER AORTIC VALVE REPLACEMENT, TRANSFEMORAL N/A 10/12/2020   Procedure: TRANSCATHETER AORTIC VALVE REPLACEMENT, TRANSFEMORAL;  Surgeon: 10/14/2020, MD;  Location: [facility] INVASIVE CV LAB;  Service: Open Heart Surgery;  Laterality: N/A;    Current Outpatient Medications  Medication Sig Dispense Refill   albuterol (VENTOLIN HFA) 108 (90 Base) MCG/ACT inhaler Inhale 2 puffs into the lungs every 6 (six) hours as needed for wheezing or shortness of breath. 8 g 2   amiodarone (PACERONE) 200 MG tablet Take 0.5 tablets (100 mg total) by mouth daily. 90 tablet 3   amoxicillin (AMOXIL) 500 MG tablet Take 4 tablets (2,000 mg) one hour prior to all dental visits.  12 tablet 12   apixaban (ELIQUIS) 5 MG TABS tablet Take 1 tablet (5 mg total) by mouth 2 (two) times daily. 60 tablet 5   aspirin 81 MG chewable tablet Chew 1 tablet (81 mg total) by mouth daily. 90 tablet 1   cholecalciferol (VITAMIN D3) 25 MCG (1000 UNIT) tablet Take 1,000 Units by mouth daily.     Coenzyme Q10 (CO Q-10) 200 MG CAPS Take 200 mg by mouth daily.     ferrous sulfate 325 (65 FE) MG tablet Take 325 mg by mouth daily with breakfast.     furosemide (LASIX) 20 MG tablet Take 1 tablet (20 mg total) by mouth in the morning. 30 tablet 5   Magnesium 400 MG TABS Take 400 mg by mouth  daily. 30 tablet 5   potassium chloride SA (KLOR-CON) 20 MEQ tablet Take 1 tablet (20 mEq total) by mouth daily. 30 tablet 5   No current facility-administered medications for this visit.    Allergies:   Lisinopril   Social History:  The patient  reports that she has been smoking e-cigarettes. She has never used smokeless tobacco. She reports that she does not drink alcohol and does not use drugs.   Family History:  The patient's family history includes Alzheimer's disease in her mother; Stroke in her father.  ROS:  Please see the history of present illness. All other systems are reviewed and otherwise negative.   PHYSICAL EXAM:  VS:  LMP  (LMP Unknown)  BMI: There is no height or weight on file to calculate BMI. Well nourished, well developed, in no acute distress  HEENT: normocephalic, atraumatic  Neck: no JVD, carotid bruits or masses Cardiac:  RRR; soft SM, no rubs, or gallops Lungs:  CTA b/l, no wheezing, rhonchi or rales  Abd: soft, nontender MS: no deformity, advanced atrophy Ext: no edema  Skin: warm and dry, no rash Neuro:  No gross deficits appreciated Psych: euthymic mood, full affect  PPM site is stable, ,o tethering or discomfort   EKG:  not done today   PPM interrogation done today and reviewed by myself: Battery and lead measurements are good She is in SR today Burden is 4.7%  3 monitored VT episodes 2 are the day of her TAVR and likely rapid pacing for deployment The other is a 1:1 and back in Nov 2021   10/13/2020: TTE IMPRESSIONS   1. The aortic valve has been repaired/replaced. Aortic valve  regurgitation is not visualized. There is a 23 mm Edwards Sapien  prosthetic (TAVR) valve present in the aortic position. Procedure Date:  10/12/2020. Aortic valve mean gradient measures 12.7  mmHg. Aortic valve Vmax measures 2.41 m/s. Aortic valve acceleration time  measures 88 msec. DVI 0.48; No significant para-valvular leak.   2. Left ventricular ejection  fraction, by estimation, is 60%. The left  ventricle has normal function. The left ventricle has no regional wall  motion abnormalities. There is moderate concentric left ventricular  hypertrophy. Left ventricular diastolic  parameters are indeterminate.   3. Right ventricular systolic function is normal. The right ventricular  size is normal. There is normal pulmonary artery systolic pressure. The  estimated right ventricular systolic pressure is 35.5 mmHg.   4. Left atrial size was severely dilated.   5. The mitral valve is grossly normal. No evidence of mitral valve  regurgitation.   6. The inferior vena cava is normal in size with greater than 50%  respiratory variability, suggesting right atrial pressure of 3 mmHg.  Comparison(s): A prior study was performed on 10/12/20. No significant  change from prior study. Prior images reviewed side by side. Compared to  prior no significant paravalular leak; gradients not suggestive of  prosthetic dysfunction, and tricuspid  regurgitation has improved through series.   10/16/2019: TTE IMPRESSIONS  1. Left ventricular ejection fraction, by estimation, is 50%. The left  ventricle has mildly decreased function. The left ventricle demonstrates  global hypokinesis. There is mild left ventricular hypertrophy. Left  ventricular diastolic parameters are  indeterminate. Elevated left ventricular end-diastolic pressure.   2. Right ventricular systolic function is normal. The right ventricular  size is normal. There is mildly elevated pulmonary artery systolic  pressure. The estimated right ventricular systolic pressure is 40.2 mmHg.   3. Left atrial size was moderately dilated.   4. Right atrial size was mildly dilated.   5. The mitral valve is abnormal. Mild to moderate mitral valve  regurgitation. No evidence of mitral stenosis.   6. Tricuspid valve regurgitation is moderate.   7. The aortic valve is severely calcified with severely reduced  leaflet  motion. The dimensionless index is 0.17, however LVOT TVI may be  underestimated. 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 The aortic valve is  abnormal. Aortic valve regurgitation is not visualized. Aortic valve mean  gradient measures 21.0 mmHg.   8. The inferior vena cava is dilated in size with <50% respiratory  variability, suggesting right atrial pressure of 15 mmHg.   Recent Labs: 04/28/2020: TSH 1.349 10/08/2020: ALT 23 10/13/2020: BUN 13; Creatinine, Ser 1.04; Hemoglobin 12.4; Magnesium 1.7; Platelets 168; Potassium 4.0; Sodium 141  No results found for requested labs within last 8760 hours.   CrCl cannot be calculated (Patient's most recent lab result is older than the maximum 21 days allowed.).   Wt Readings from Last 3 Encounters:  10/20/20 126 lb 6.4 oz (57.3 kg)  10/13/20 128 lb 12.8 oz (58.4 kg)  10/08/20 125 lb 4 oz (56.8 kg)     Other studies reviewed: Additional studies/records reviewed today include: summarized above  ASSESSMENT AND PLAN:  1. PPM     Intact function, no programming changes made  2. Paroxysmal Afib     CHA2DS2Vasc is 6, on Eliquis, appropriately dosed      4.7% burden  Now s/p TAVR and with her concerns of amio issues Reduce to 100mg  M-F, none Sat/sun    3. HTN     No changes today         4. Chronic CHF (diastolic) 5. 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     No symptoms or exam findings of volume OL                  Disposition: continue remoyes Q75mo as usual and in clinic in 56mo, sooner if needed.    Current medicines are reviewed at length with the patient today.  The patient did not have any concerns regarding medicines.  5mo, PA-C 11/24/2020 4:32 AM     [facility] [address] [place] [phone] (office)  [phone] (fax)

## 2020-11-25 ENCOUNTER — Ambulatory Visit (INDEPENDENT_AMBULATORY_CARE_PROVIDER_SITE_OTHER): Payer: Medicare Other | Admitting: Physician Assistant

## 2020-11-25 ENCOUNTER — Ambulatory Visit (HOSPITAL_COMMUNITY): Payer: Medicare Other | Attending: Internal Medicine

## 2020-11-25 ENCOUNTER — Encounter: Payer: Self-pay | Admitting: Physician Assistant

## 2020-11-25 VITALS — BP 110/60 | HR 60 | Ht 61.0 in | Wt 122.8 lb

## 2020-11-25 DIAGNOSIS — Z952 Presence of prosthetic heart valve: Secondary | ICD-10-CM | POA: Diagnosis present

## 2020-11-25 DIAGNOSIS — I480 Paroxysmal atrial fibrillation: Secondary | ICD-10-CM

## 2020-11-25 DIAGNOSIS — Z950 Presence of cardiac pacemaker: Secondary | ICD-10-CM

## 2020-11-25 LAB — ECHOCARDIOGRAM COMPLETE
AR max vel: 1.71 cm2
AV Area VTI: 1.64 cm2
AV Area mean vel: 1.65 cm2
AV Mean grad: 10 mmHg
AV Peak grad: 18.7 mmHg
Ao pk vel: 2.16 m/s
Area-P 1/2: 2.44 cm2
S' Lateral: 2.8 cm

## 2020-11-25 NOTE — Progress Notes (Signed)
[facility]   MULTIDISCIPLINARY HEART VALVE CLINIC                                     Cardiology Office Note:    Date:  11/25/2020   ID:  Kaitlyn [name], DOB May 14, 1941, MRN [id number]  PCP:  [name], [name], MD  [facility] Cardiologist:  [name], MD / Dr. Clifton James & Dr. Laneta Simmers (TAVR) [facility] Electrophysiologist:  Sherryl Manges, MD   Referring MD: [name], [name], *   1 month s/p TAVR  History of Present Illness:    Kaitlyn [name] is a 80 y.o. 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   She suffered a stroke in January 2021 resulting in dysarthria and confusion. MRI showed a chronic hemorrhagic infarct in the right frontal lobe with surrounding subacute infarct.  She also had problems with acute hepatic encephalopathy at that time with elevated ammonia level.  2D echocardiogram in May 2021 showed a severely calcified aortic valve with severely restricted mobility.  The mean gradient was 21 mmHg with a peak gradient of 32 mmHg.  Aortic valve area by VTI was 0.48 cm.  Dimensionless index was 0.17.  Left ventricular ejection fraction was mildly decreased at 50% with global hypokinesis and mild LVH. She was admitted to [facility] in September 2021 with shortness of breath and found to be in atrial fibrillation with rapid ventricular response.  Echocardiogram at that time showed ejection fraction of 35 to 40%.  She was transferred to [facility] and had an echo on 02/09/2020 showing severely thickened aortic valve leaflets with severely restricted leaflet mobility.  The mean gradient was measured at 29 mmHg with an aortic valve area of 0.55 cm.  Ejection fraction was 55 to 60% at  that time.  There was no evidence of left atrial appendage thrombus and she was cardioverted to sinus rhythm.  She underwent cardiac catheterization in September 2021 that showed no evidence of coronary disease.  Repeat echo in 06/2020 showed EF 60-65%, moderate LVH, and severe AS with a mean gradient of 41 mm Hg and AVA 0.49cm2 & DVI 0.17. The patient was seen back by Dr. Elease Hashimoto and reported a history of progressive exertional fatigue and shortness of breath since around January.   Shee was evaluated by the multidisciplinary valve team and underwent successful TAVR with a 23 mm Edwards Sapien 3 Ultra THV via the TF approach on 10/12/2020. Post operative echo showed EF 60%, normally functioning TAVR with a mean gradient of 12.7 mmHg and no PVL. She was resumed on home Eliquis with the addition of a baby aspirin 81mg  daily x 6 months.   She was seen by PA on 11/24/20 and feeling great. Her amio was decreased given pt preference. Afib 4.7% on device.   Today she presents to clinic for follow up. Here with husband. Doing great with a big improvement in symptoms. She has much more stamina and energy since TAVR. Back walking on the treadmill. No CP or SOB. No LE edema, orthopnea or PND. No dizziness or syncope. No blood in stool or urine. No palpitations.    Past  Medical History:  Diagnosis Date   Arthritis    both knees   Chronic pain    History of GI bleed    History of stroke 05/2019   Persistent atrial fibrillation (HCC)    Presence of permanent cardiac pacemaker    placed for SSS- Medtronic device    S/P TAVR (transcatheter aortic valve replacement) 10/12/2020   s/p TAVR with a 104mm Edwards S3U via the TF approach by Dr. Clifton James & Dr. Laneta Simmers    Severe aortic stenosis     Past Surgical History:  Procedure Laterality Date   CERVICAL SPINE SURGERY     EYE SURGERY Left    PACEMAKER IMPLANT     PPM GENERATOR CHANGEOUT N/A 10/17/2019   Procedure: PPM GENERATOR CHANGEOUT;  Surgeon:  Duke Salvia, MD;  Location: [facility] INVASIVE CV LAB;  Service: Cardiovascular;  Laterality: N/A;   shoulder replacement Left    TEE WITHOUT CARDIOVERSION N/A 10/12/2020   Procedure: TRANSESOPHAGEAL ECHOCARDIOGRAM (TEE);  Surgeon: Kathleene Hazel, MD;  Location: [facility] INVASIVE CV LAB;  Service: Open Heart Surgery;  Laterality: N/A;   TRANSCATHETER AORTIC VALVE REPLACEMENT, TRANSFEMORAL N/A 10/12/2020   Procedure: TRANSCATHETER AORTIC VALVE REPLACEMENT, TRANSFEMORAL;  Surgeon: Kathleene Hazel, MD;  Location: [facility] INVASIVE CV LAB;  Service: Open Heart Surgery;  Laterality: N/A;    Current Medications: Current Meds  Medication Sig   albuterol (VENTOLIN HFA) 108 (90 Base) MCG/ACT inhaler Inhale 2 puffs into the lungs every 6 (six) hours as needed for wheezing or shortness of breath.   amiodarone (PACERONE) 200 MG tablet Take 0.5 tablets (100 mg total) by mouth daily. None on weekends   apixaban (ELIQUIS) 5 MG TABS tablet Take 1 tablet (5 mg total) by mouth 2 (two) times daily.   aspirin 81 MG chewable tablet Chew 1 tablet (81 mg total) by mouth daily.   cholecalciferol (VITAMIN D3) 25 MCG (1000 UNIT) tablet Take 1,000 Units by mouth daily.   Coenzyme Q10 (CO Q-10) 200 MG CAPS Take 200 mg by mouth daily.   ferrous sulfate 325 (65 FE) MG tablet Take 325 mg by mouth daily with breakfast.   furosemide (LASIX) 20 MG tablet Take 1 tablet (20 mg total) by mouth in the morning.   Magnesium 400 MG TABS Take 400 mg by mouth daily.   potassium chloride SA (KLOR-CON) 20 MEQ tablet Take 1 tablet (20 mEq total) by mouth daily.   Thiamine HCl (VITAMIN B-1 PO) Take 100 mg by mouth daily.     Allergies:   Lisinopril   Social History   Socioeconomic History   Marital status: Married    Spouse name: Not on file   Number of children: 0   Years of education: Not on file   Highest education level: Not on file  Occupational History   Occupation: Accountant  Tobacco Use   Smoking status: Every Day     Pack years: 0.00    Types: E-cigarettes   Smokeless tobacco: Never   Tobacco comments:    vaping all day  Vaping Use   Vaping Use: Every day   Start date: 10/27/2013  Substance and Sexual Activity   Alcohol use: Never   Drug use: Never   Sexual activity: Not on file  Other Topics Concern   Not on file  Social History Narrative   Not on file   Social Determinants of Health   Financial Resource Strain: Not on file  Food Insecurity: Not on file  Transportation Needs: Not  on file  Physical Activity: Not on file  Stress: Not on file  Social Connections: Not on file     Family History: The patient's family history includes Alzheimer's disease in her mother; Stroke in her father.  ROS:   Please see the history of present illness.    All other systems reviewed and are negative.  EKGs/Labs/Other Studies Reviewed:    The following studies were reviewed today:   TAVR OPERATIVE NOTE     Date of Procedure:                10/12/2020   Preoperative Diagnosis:      Severe Aortic Stenosis    Postoperative Diagnosis:    Same    Procedure:        Transcatheter Aortic Valve Replacement - Percutaneous Left Transfemoral Approach             Edwards Sapien 3 Ultra THV (size 23 mm, model # 9750TFX, serial # [id number])              Co-Surgeons:                        Alleen Borne, MD and Verne Carrow, MD     Anesthesiologist:                  Fleeta Emmer, MD   Echocardiographer:              Merlyn Lot   Pre-operative Echo Findings: Severe aortic stenosis Normal left ventricular systolic function   Post-operative Echo Findings: No paravalvular leak Normal left ventricular systolic function   _____________   Echo 10/13/2020: IMPRESSIONS   1. The aortic valve has been repaired/replaced. Aortic valve  regurgitation is not visualized. There is a 23 mm Edwards Sapien  prosthetic (TAVR) valve present in the aortic position. Procedure Date:  10/12/2020. Aortic  valve mean gradient measures 12.7  mmHg. Aortic valve Vmax measures 2.41 m/s. Aortic valve acceleration time  measures 88 msec. DVI 0.48; No significant para-valvular leak.   2. Left ventricular ejection fraction, by estimation, is 60%. The left  ventricle has normal function. The left ventricle has no regional wall  motion abnormalities. There is moderate concentric left ventricular  hypertrophy. Left ventricular diastolic  parameters are indeterminate.   3. Right ventricular systolic function is normal. The right ventricular  size is normal. There is normal pulmonary artery systolic pressure. The  estimated right ventricular systolic pressure is 35.5 mmHg.   4. Left atrial size was severely dilated.   5. The mitral valve is grossly normal. No evidence of mitral valve  regurgitation.   6. The inferior vena cava is normal in size with greater than 50%  respiratory variability, suggesting right atrial pressure of 3 mmHg.   Comparison(s): A prior study was performed on 10/12/20. No significant  change from prior study. Prior images reviewed side by side. Compared to  prior no significant paravalular leak; gradients not suggestive of  prosthetic dysfunction, and tricuspid  regurgitation has improved through series.   _________________________  Echo 11/25/20 IMPRESSIONS  1. The aortic valve has been repaired/replaced. Aortic valve regurgitation is not visualized. No aortic stenosis is present. There is a 23 mm Edwards Sapien prosthetic (TAVR) valve present in the aortic position. Procedure Date: 10/12/2020. Echo findings  are consistent with normal structure and function of the aortic valve prosthesis. Aortic valve mean gradient measures 10.0 mmHg. Aortic valve Vmax measures 2.16 m/s. Aortic  valve acceleration time measures 75 msec.  2. Left ventricular ejection fraction, by estimation, is 60 to 65%. The left ventricle has normal function. The left ventricle has no regional wall motion  abnormalities. There is mild left ventricular hypertrophy. Left ventricular diastolic parameters are consistent with Grade II diastolic dysfunction (pseudonormalization).  3. Right ventricular systolic function is normal. The right ventricular size is normal. There is normal pulmonary artery systolic pressure. The estimated right ventricular systolic pressure is 22.4 mmHg.  4. Left atrial size was mildly dilated.  5. The mitral valve is normal in structure. Trivial mitral valve regurgitation. No evidence of mitral stenosis.  6. The inferior vena cava is normal in size with greater than 50% respiratory variability, suggesting right atrial pressure of 3 mmHg.   EKG:  EKG is NOT ordered today.    Recent Labs: 04/28/2020: TSH 1.349 10/08/2020: ALT 23 10/13/2020: BUN 13; Creatinine, Ser 1.04; Hemoglobin 12.4; Magnesium 1.7; Platelets 168; Potassium 4.0; Sodium 141  Recent Lipid Panel    Component Value Date/Time   CHOL 139 11/13/2019 1136   TRIG 113 11/13/2019 1136   HDL 44 11/13/2019 1136   CHOLHDL 3.2 11/13/2019 1136   LDLCALC 74 11/13/2019 1136     Risk Assessment/Calculations:       Physical Exam:    VS:  BP 110/60   Pulse 60   Ht 5\' 1"  (1.549 m)   Wt 122 lb 12.8 oz (55.7 kg)   LMP  (LMP Unknown)   SpO2 98%   BMI 23.20 kg/m     Wt Readings from Last 3 Encounters:  11/25/20 122 lb 12.8 oz (55.7 kg)  11/24/20 122 lb 6.4 oz (55.5 kg)  10/20/20 126 lb 6.4 oz (57.3 kg)     GEN:  Well nourished, well developed in no acute distress HEENT: Normal NECK: No JVD; No carotid bruits LYMPHATICS: No lymphadenopathy CARDIAC: RRR, no murmurs, rubs, gallops RESPIRATORY:  Clear to auscultation without rales, wheezing or rhonchi  ABDOMEN: Soft, non-tender, non-distended MUSCULOSKELETAL:  No edema; No deformity  SKIN: Warm and dry.  Groin sites clear without hematoma or ecchymosis  NEUROLOGIC:  Alert and oriented x 3 PSYCHIATRIC:  Normal affect   ASSESSMENT:    1. S/P TAVR  (transcatheter aortic valve replacement)   2. S/P placement of cardiac pacemaker   3. PAF (paroxysmal atrial fibrillation) (HCC)    PLAN:    In order of problems listed above:  Severe AS s/p TAVR: echo today shows EF 60%, normally functioning TAVR with a mean gradient of 10 mm hg and no PVL. She has NYHA class I symptoms with a marked clinical improvement since TAVR. SBE prophylaxis discussed; she has amoxicillin. Continue on Eliquis and aspirin. Aspirin can be discontinued on 11/17.  I will see her back in 1 year for follow up and echo.    S/p PPM: followed by Dr. Graciela HusbandsKlein   Paroxysmal afib: seen in device clinic earlier this week. Noted to have 4.7% burden. Amiodarone reduced to 100mg  M-F, none Sat/sun per patient wishes. Appears to be in sinus today on echo tracings.   Medication Adjustments/Labs and Tests Ordered: Current medicines are reviewed at length with the patient today.  Concerns regarding medicines are outlined above.  No orders of the defined types were placed in this encounter.  No orders of the defined types were placed in this encounter.   Patient Instructions  Medication Instructions:  Your physician has recommended you make the following change in your medication: On 04/14/2021 you  can STOP Aspirin  *If you need a refill on your cardiac medications before your next appointment, please call your pharmacy*   Lab Work: None ordered   Testing/Procedures: None ordered   Follow-Up: At [facility], you and your health needs are our priority.  As part of our continuing mission to provide you with exceptional heart care, we have created designated Provider Care Teams.  These Care Teams include your primary Cardiologist (physician) and Advanced Practice Providers (APPs -  Physician Assistants and Nurse Practitioners) who all work together to provide you with the care you need, when you need it.  Your next appointment:   4 month(s)  The format for your next  appointment:   In Person  Provider:   [name], MD   Your physician recommends that you schedule a follow-up appointment in 1 year with Bary Castilla.   Thank you for choosing [facility]!!       Signed, Cline Crock, PA-C  11/25/2020 4:47 PM    [facility]

## 2020-11-25 NOTE — Patient Instructions (Signed)
Medication Instructions:  Your physician has recommended you make the following change in your medication: On 04/14/2021 you can STOP Aspirin  *If you need a refill on your cardiac medications before your next appointment, please call your pharmacy*   Lab Work: None ordered   Testing/Procedures: None ordered   Follow-Up: At [facility], you and your health needs are our priority.  As part of our continuing mission to provide you with exceptional heart care, we have created designated Provider Care Teams.  These Care Teams include your primary Cardiologist (physician) and Advanced Practice Providers (APPs -  Physician Assistants and Nurse Practitioners) who all work together to provide you with the care you need, when you need it.  Your next appointment:   4 month(s)  The format for your next appointment:   In Person  Provider:   [name], MD   Your physician recommends that you schedule a follow-up appointment in 1 year with Bary Castilla.   Thank you for choosing [facility]!!

## 2021-01-14 ENCOUNTER — Ambulatory Visit (INDEPENDENT_AMBULATORY_CARE_PROVIDER_SITE_OTHER): Payer: Medicare Other

## 2021-01-14 DIAGNOSIS — I480 Paroxysmal atrial fibrillation: Secondary | ICD-10-CM | POA: Diagnosis not present

## 2021-01-14 LAB — CUP PACEART REMOTE DEVICE CHECK
Battery Remaining Longevity: 152 mo
Battery Voltage: 3.06 V
Brady Statistic AP VP Percent: 0.47 %
Brady Statistic AP VS Percent: 97.33 %
Brady Statistic AS VP Percent: 0.03 %
Brady Statistic AS VS Percent: 2.18 %
Brady Statistic RA Percent Paced: 97.74 %
Brady Statistic RV Percent Paced: 0.56 %
Date Time Interrogation Session: 20220819013818
Implantable Lead Implant Date: 20091201
Implantable Lead Implant Date: 20091209
Implantable Lead Location: 753859
Implantable Lead Location: 753860
Implantable Lead Model: 4076
Implantable Lead Model: 4076
Implantable Pulse Generator Implant Date: 20210521
Lead Channel Impedance Value: 323 Ohm
Lead Channel Impedance Value: 380 Ohm
Lead Channel Impedance Value: 684 Ohm
Lead Channel Impedance Value: 741 Ohm
Lead Channel Pacing Threshold Amplitude: 0.5 V
Lead Channel Pacing Threshold Amplitude: 0.5 V
Lead Channel Pacing Threshold Pulse Width: 0.4 ms
Lead Channel Pacing Threshold Pulse Width: 0.4 ms
Lead Channel Sensing Intrinsic Amplitude: 1.375 mV
Lead Channel Sensing Intrinsic Amplitude: 1.375 mV
Lead Channel Sensing Intrinsic Amplitude: 13.875 mV
Lead Channel Sensing Intrinsic Amplitude: 13.875 mV
Lead Channel Setting Pacing Amplitude: 1.5 V
Lead Channel Setting Pacing Amplitude: 2.5 V
Lead Channel Setting Pacing Pulse Width: 0.4 ms
Lead Channel Setting Sensing Sensitivity: 5.6 mV

## 2021-01-29 NOTE — Progress Notes (Signed)
Remote pacemaker transmission.   

## 2021-02-17 ENCOUNTER — Other Ambulatory Visit: Payer: Self-pay | Admitting: Cardiovascular Disease

## 2021-02-17 DIAGNOSIS — I5033 Acute on chronic diastolic (congestive) heart failure: Secondary | ICD-10-CM

## 2021-02-17 MED ORDER — POTASSIUM CHLORIDE CRYS ER 20 MEQ PO TBCR: 20.0000 meq | EXTENDED_RELEASE_TABLET | Freq: Every day | ORAL | 1 refills | Status: DC

## 2021-02-17 MED ORDER — AMIODARONE HCL 200 MG PO TABS: 100.0000 mg | ORAL_TABLET | Freq: Every day | ORAL | 1 refills | Status: DC

## 2021-02-17 MED ORDER — FUROSEMIDE 20 MG PO TABS: 20.0000 mg | ORAL_TABLET | Freq: Every morning | ORAL | 1 refills | Status: DC

## 2021-03-01 ENCOUNTER — Other Ambulatory Visit: Payer: Self-pay | Admitting: Cardiovascular Disease

## 2021-03-01 NOTE — Telephone Encounter (Signed)
Pt last saw Francis Dowse, [place] on 11/24/20, last labs 10/13/20 Creat 1.04, age 80, weight 55.7kg, based on specified criteria pt is on appropriate dosage of Eliquis 5mg  BID.  Will refill rx.

## 2021-04-15 ENCOUNTER — Ambulatory Visit (INDEPENDENT_AMBULATORY_CARE_PROVIDER_SITE_OTHER): Payer: Medicare HMO

## 2021-04-15 DIAGNOSIS — I495 Sick sinus syndrome: Secondary | ICD-10-CM | POA: Diagnosis not present

## 2021-04-15 LAB — CUP PACEART REMOTE DEVICE CHECK
Battery Remaining Longevity: 148 mo
Battery Voltage: 3.04 V
Brady Statistic AP VP Percent: 0.55 %
Brady Statistic AP VS Percent: 88.7 %
Brady Statistic AS VP Percent: 0.02 %
Brady Statistic AS VS Percent: 10.74 %
Brady Statistic RA Percent Paced: 89.41 %
Brady Statistic RV Percent Paced: 0.57 %
Date Time Interrogation Session: 20221115235251
Implantable Lead Implant Date: 20091201
Implantable Lead Implant Date: 20091209
Implantable Lead Location: 753859
Implantable Lead Location: 753860
Implantable Lead Model: 4076
Implantable Lead Model: 4076
Implantable Pulse Generator Implant Date: 20210521
Lead Channel Impedance Value: 304 Ohm
Lead Channel Impedance Value: 361 Ohm
Lead Channel Impedance Value: 684 Ohm
Lead Channel Impedance Value: 722 Ohm
Lead Channel Pacing Threshold Amplitude: 0.5 V
Lead Channel Pacing Threshold Amplitude: 0.5 V
Lead Channel Pacing Threshold Pulse Width: 0.4 ms
Lead Channel Pacing Threshold Pulse Width: 0.4 ms
Lead Channel Sensing Intrinsic Amplitude: 12 mV
Lead Channel Sensing Intrinsic Amplitude: 12 mV
Lead Channel Sensing Intrinsic Amplitude: 3 mV
Lead Channel Sensing Intrinsic Amplitude: 3 mV
Lead Channel Setting Pacing Amplitude: 1.5 V
Lead Channel Setting Pacing Amplitude: 2.5 V
Lead Channel Setting Pacing Pulse Width: 0.4 ms
Lead Channel Setting Sensing Sensitivity: 5.6 mV

## 2021-04-19 ENCOUNTER — Ambulatory Visit: Payer: Medicare HMO | Admitting: Cardiovascular Disease

## 2021-04-19 ENCOUNTER — Other Ambulatory Visit: Payer: Self-pay

## 2021-04-19 ENCOUNTER — Encounter: Payer: Self-pay | Admitting: Cardiovascular Disease

## 2021-04-19 VITALS — BP 138/62 | HR 76 | Ht 61.0 in | Wt 127.2 lb

## 2021-04-19 DIAGNOSIS — I480 Paroxysmal atrial fibrillation: Secondary | ICD-10-CM | POA: Diagnosis not present

## 2021-04-19 DIAGNOSIS — Z952 Presence of prosthetic heart valve: Secondary | ICD-10-CM

## 2021-04-19 NOTE — Patient Instructions (Signed)
Medication Instructions:  Your physician recommends that you continue on your current medications as directed. Please refer to the Current Medication list given to you today.  *If you need a refill on your cardiac medications before your next appointment, please call your pharmacy*   Lab Work: TODAY:  BMET  IN 1 YEAR:  BMET & TSH (CAN DO COUPLE DAYS BEFORE YOU SEE DR. NAHSER)  If you have labs (blood work) drawn today and your tests are completely normal, you will receive your results only by: MyChart Message (if you have MyChart) OR A paper copy in the mail If you have any lab test that is abnormal or we need to change your treatment, we will call you to review the results.   Testing/Procedures: None ordered   Follow-Up: At [facility], you and your health needs are our priority.  As part of our continuing mission to provide you with exceptional heart care, we have created designated Provider Care Teams.  These Care Teams include your primary Cardiologist (physician) and Advanced Practice Providers (APPs -  Physician Assistants and Nurse Practitioners) who all work together to provide you with the care you need, when you need it.  We recommend signing up for the patient portal called "MyChart".  Sign up information is provided on this After Visit Summary.  MyChart is used to connect with patients for Virtual Visits (Telemedicine).  Patients are able to view lab/test results, encounter notes, upcoming appointments, etc.  Non-urgent messages can be sent to your provider as well.   To learn more about what you can do with MyChart, go to [email or web address].    Your next appointment:   12 month(s)  The format for your next appointment:   In Person  Provider:   [name], MD    Other Instructions

## 2021-04-19 NOTE — Progress Notes (Signed)
Cardiology Office Note:    Date:  04/19/2021   ID:  Kaitlyn [name], DOB [birth year], MRN [id number]  PCP:  Jacelyn Pi, Lilia Argue, MD  [facility] Cardiologist:  Mertie Moores, MD , Dr.  Novella Rob at [place] Electrophysiologist:  Virl Axe, MD   Referring MD: Jacelyn Pi, Lilia Argue, *   No chief complaint on file.   December 16, 2019   Kaitlyn [name] is a 80 y.o. female with a hx of paroxysmal atrial fibrillation, history of GI bleed, aortic stenosis, pacemaker implantation.  She has moderate to severe aortic stenosis by echo previously in the [place] system.  I met her in the hospital in May, 2021 when she presented with 3 days of progressive lower extremity edema and as well as increasing shortness of breath.  She had mildly elevated troponin levels.  She had a generator change on May 21.  Hx of Afib,  S/p recent stroke.  VSD, DVT HTN  Has been getting speech therapy - swallow eval / coaching  Has had a swallowing study.   Found to have very slow transition of food and liquids. She gets choked with eating   Echo from May, 2021 shows EF 50%.  Mild - moderate pulmonary HTN - est PA pressure of 40 Moderate AS - mean AV gradeitn of 21 mmhg .  Has chronic rales , rhonchi in her right base  Feb. 14, 2022: Kaitlyn [name] is seen today for follow up of her PAF, aortic stenosis, pacer implantation, She is s/p stroke.   Has a hx of chronic aspiration.  She chokes while eating.  Has chronic rales in right base. Has been seen by the [facility]  Cardiology at [place]  for atrial fib and new LVEF of 35-40%.  TEE at [facility] showed ? Of LAA thrombus.   Was transferred to [place],  TEE did not show LAA thrombus and she had a cardioversion on 02/05/20.  TEE showed a mean AV gradient of 29 mm Hg - was thought to be more severe than that. R/LHC showed no CAD , normal filling pressures and moderate AS .  Echo today shows AV gradient of 42 mmhg.   Does her normal activities without  any CP, Has some DOE with exertion.  Able to climb steps  No syncope  Has a pacer  Still chokes when she eats and drinks - usually occurs once a day  Has a chronic cough which is due to chronic aspiration I suspect.   Nov. 22, 2022: Kaitlyn [name] is seen for follow up of her AS - she is now s/p TAVR .  She is feeling much better. Has been seen by Nell Range , NP   Has hx of chronic aspiration .  This seems to have improved with the TAVR   Amiodarone has been decreased to 100 mg a day , just 5 days a week.   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 MAngelena Form& Dr. BCyndia Bent   Severe aortic stenosis     Past Surgical History:  Procedure Laterality Date  CERVICAL SPINE SURGERY     EYE SURGERY Left    PACEMAKER IMPLANT     PPM GENERATOR CHANGEOUT N/A 10/17/2019   Procedure: PPM GENERATOR CHANGEOUT;  Surgeon: Deboraha Sprang, MD;  Location: [place] [facility];  Service: Cardiovascular;  Laterality: N/A;   shoulder replacement Left    TEE WITHOUT CARDIOVERSION N/A 10/12/2020   Procedure: TRANSESOPHAGEAL ECHOCARDIOGRAM (TEE);  Surgeon: Burnell Blanks, MD;  Location: [place] [facility];  Service: Open Heart Surgery;  Laterality: N/A;   TRANSCATHETER AORTIC VALVE REPLACEMENT, TRANSFEMORAL N/A 10/12/2020   Procedure: TRANSCATHETER AORTIC VALVE REPLACEMENT, TRANSFEMORAL;  Surgeon: Burnell Blanks, MD;  Location: [place];  Service: Open Heart Surgery;  Laterality: N/A;    Current Medications: Current Meds  Medication Sig   albuterol (VENTOLIN HFA) 108 (90 Base) MCG/ACT inhaler Inhale 2 puffs into the lungs every 6 (six) hours as  needed for wheezing or shortness of breath.   amiodarone (PACERONE) 200 MG tablet Take 0.5 tablets (100 mg total) by mouth daily. None on weekends   cholecalciferol (VITAMIN D3) 25 MCG (1000 UNIT) tablet Take 1,000 Units by mouth daily.   Coenzyme Q10 (CO Q-10) 200 MG CAPS Take 200 mg by mouth daily.   ELIQUIS 5 MG TABS tablet TAKE 1 TABLET BY MOUTH TWICE DAILY   ferrous sulfate 325 (65 FE) MG tablet Take 325 mg by mouth daily with breakfast.   furosemide (LASIX) 20 MG tablet Take 1 tablet (20 mg total) by mouth in the morning.   Magnesium 400 MG TABS Take 400 mg by mouth daily.   potassium chloride SA (KLOR-CON) 20 MEQ tablet Take 1 tablet (20 mEq total) by mouth daily.   sertraline (ZOLOFT) 25 MG tablet Take 1 tablet by mouth daily.     Allergies:   Lisinopril   Social History   Socioeconomic History   Marital status: Married    Spouse name: Not on file   Number of children: 0   Years of education: Not on file   Highest education level: Not on file  Occupational History   Occupation: Accountant  Tobacco Use   Smoking status: Every Day    Types: E-cigarettes   Smokeless tobacco: Never   Tobacco comments:    vaping all day  Vaping Use   Vaping Use: Every day   Start date: 10/27/2013  Substance and Sexual Activity   Alcohol use: Never   Drug use: Never   Sexual activity: Not on file  Other Topics Concern   Not on file  Social History Narrative   Not on file   Social Determinants of Health   Financial Resource Strain: Not on file  Food Insecurity: Not on file  Transportation Needs: Not on file  Physical Activity: Not on file  Stress: Not on file  Social Connections: Not on file     Family History: The patient's family history includes Alzheimer's disease in her mother; Stroke in her father.  ROS:   Please see the history of present illness.     All other systems reviewed and are negative.  EKGs/Labs/Other Studies Reviewed:    The following studies were  reviewed today:   EKG:    Recent Labs: 04/28/2020: TSH 1.349 10/08/2020: ALT 23 10/13/2020: BUN 13; Creatinine, Ser 1.04; Hemoglobin 12.4; Magnesium 1.7; Platelets 168; Potassium 4.0; Sodium 141  Recent Lipid Panel    Component Value Date/Time   CHOL 139 11/13/2019 1136   TRIG 113 11/13/2019 1136   HDL  44 11/13/2019 1136   CHOLHDL 3.2 11/13/2019 1136   LDLCALC 74 11/13/2019 1136    Physical Exam:    Physical Exam: Blood pressure 138/62, pulse 76, height '5\' 1"'  (1.549 m), weight 127 lb 3.2 oz (57.7 kg), SpO2 99 %.  GEN:  Well nourished, well developed in no acute distress HEENT: Normal NECK: No JVD; No carotid bruits LYMPHATICS: No lymphadenopathy CARDIAC: RRR , soft systolic murmur  RESPIRATORY:  Clear to auscultation without rales, wheezing or rhonchi  ABDOMEN: Soft, non-tender, non-distended MUSCULOSKELETAL:  No edema; No deformity  SKIN: Warm and dry NEUROLOGIC:  Alert and oriented x 3     ASSESSMENT:    1. PAF (paroxysmal atrial fibrillation) ([place])   2. S/P TAVR (transcatheter aortic valve replacement)     PLAN:       Aortic stenosis: .   S/p TAVR .   She is much better following her TAVR.  Marland Kitchen   Has been seen by the TAVR team.  Reminded her to take her SBE prophylaxis prior to any dental work .     2. Afib : Continue Eliquis ,   s/p pacer    Medication Adjustments/Labs and Tests Ordered: Current medicines are reviewed at length with the patient today.  Concerns regarding medicines are outlined above.  Orders Placed This Encounter  Procedures   Basic metabolic panel   Basic metabolic panel   TSH    No orders of the defined types were placed in this encounter.   Patient Instructions  Medication Instructions:  Your physician recommends that you continue on your current medications as directed. Please refer to the Current Medication list given to you today.  *If you need a refill on your cardiac medications before your next appointment, please call your  pharmacy*   Lab Work: TODAY:  BMET  IN 1 YEAR:  BMET & TSH (CAN DO COUPLE DAYS BEFORE YOU SEE DR. Ladarryl Wrage)  If you have labs (blood work) drawn today and your tests are completely normal, you will receive your results only by: [place] (if you have MyChart) OR A paper copy in the mail If you have any lab test that is abnormal or we need to change your treatment, we will call you to review the results.   Testing/Procedures: None ordered   Follow-Up: At [facility], you and your health needs are our priority.  As part of our continuing mission to provide you with exceptional heart care, we have created designated Provider Care Teams.  These Care Teams include your primary Cardiologist (physician) and Advanced Practice Providers (APPs -  Physician Assistants and Nurse Practitioners) who all work together to provide you with the care you need, when you need it.  We recommend signing up for the patient portal called "MyChart".  Sign up information is provided on this After Visit Summary.  MyChart is used to connect with patients for Virtual Visits (Telemedicine).  Patients are able to view lab/test results, encounter notes, upcoming appointments, etc.  Non-urgent messages can be sent to your provider as well.   To learn more about what you can do with MyChart, go to [email or web address].    Your next appointment:   12 month(s)  The format for your next appointment:   In Person  Provider:   Mertie Moores, MD    Other Instructions   Signed, Mertie Moores, MD  04/19/2021 2:37 PM    [place]

## 2021-04-20 LAB — BASIC METABOLIC PANEL
BUN/Creatinine Ratio: 15 (ref 12–28)
BUN: 12 mg/dL (ref 8–27)
CO2: 28 mmol/L (ref 20–29)
Calcium: 9.6 mg/dL (ref 8.7–10.3)
Chloride: 102 mmol/L (ref 96–106)
Creatinine, Ser: 0.79 mg/dL (ref 0.57–1.00)
Glucose: 92 mg/dL (ref 70–99)
Potassium: 4.3 mmol/L (ref 3.5–5.2)
Sodium: 141 mmol/L (ref 134–144)
eGFR: 77 mL/min/{1.73_m2} (ref >59)

## 2021-04-25 NOTE — Progress Notes (Signed)
Remote pacemaker transmission.   

## 2021-05-07 IMAGING — DX DG CHEST 1V PORT
1 series · 1 of 1 positions shown · non-contrast
Comparison: Radiograph 11/22/2017

CLINICAL DATA: Cough and shortness of breath. Flu like symptoms for
4 days.

EXAM:
PORTABLE CHEST 1 VIEW

[chest ap]
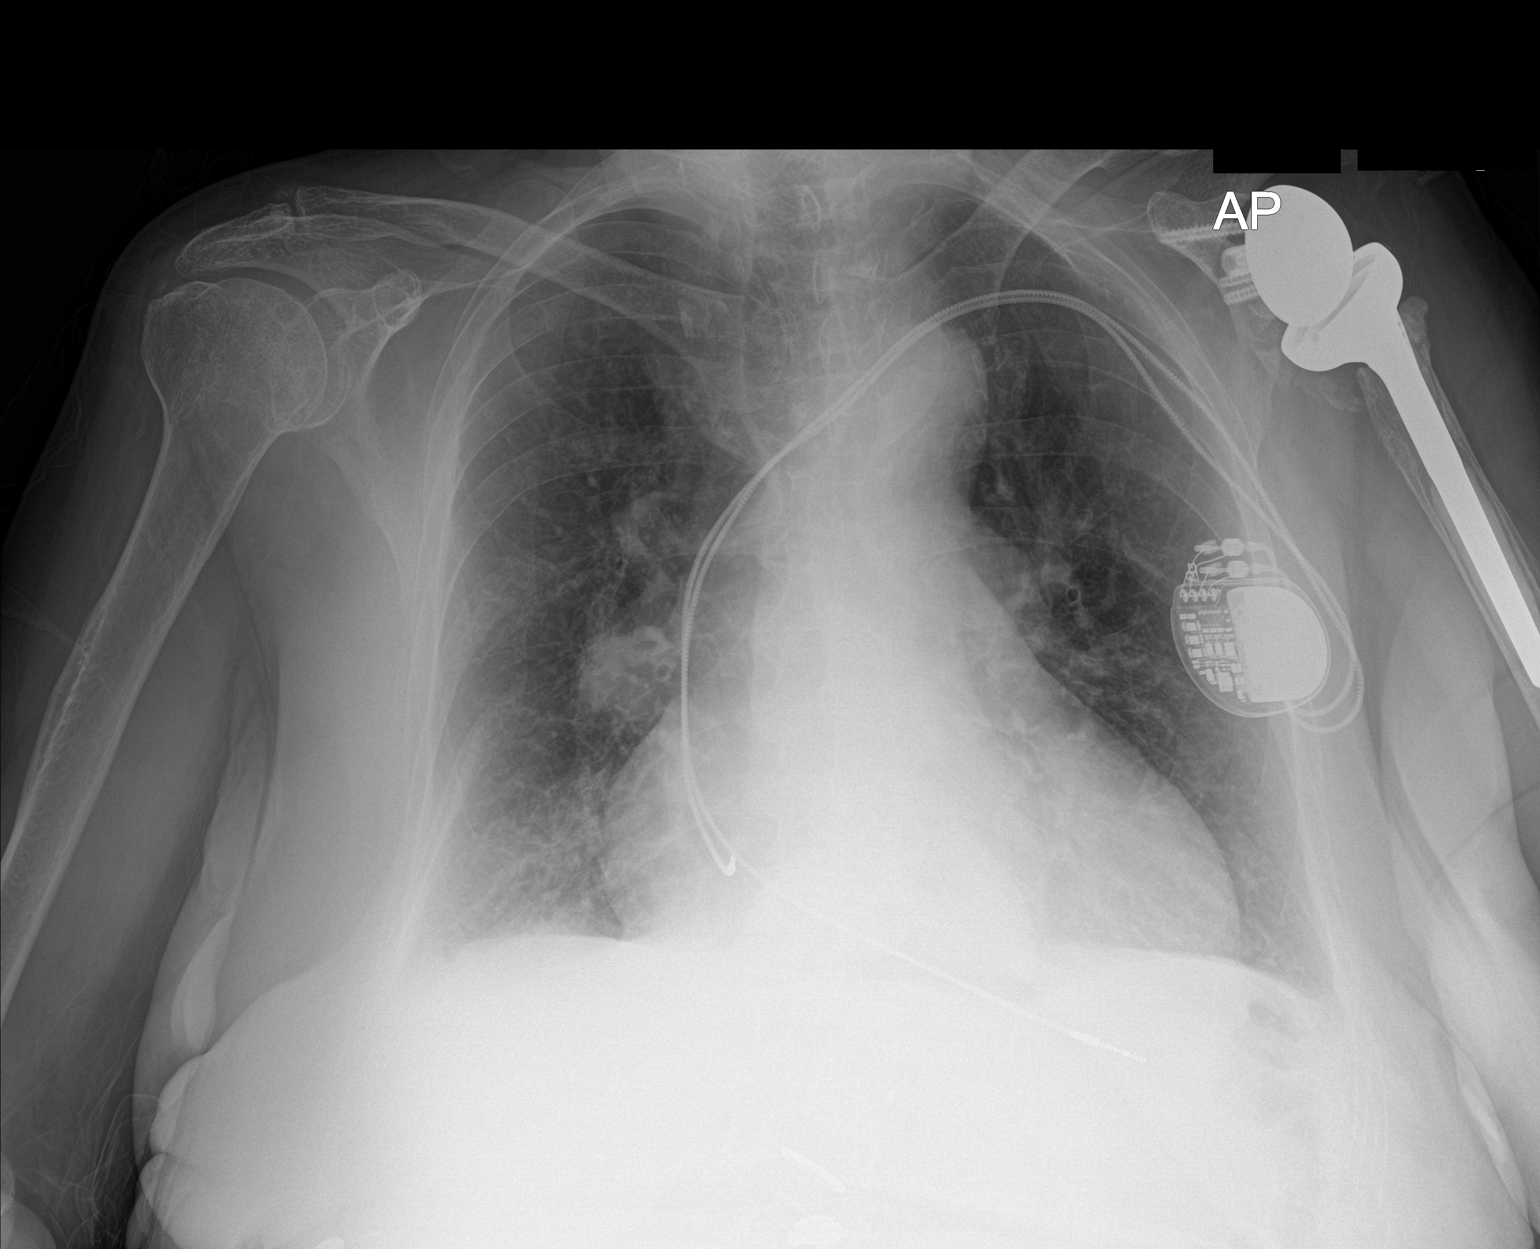

[1 of 1 positions shown; findings below may reference images not displayed]

FINDINGS: Left-sided pacemaker remains in place. Cardiomegaly is increased
from prior exam. Unchanged mediastinal contours. There is a
retrocardiac hiatal hernia. Interstitial and peribronchial
thickening suspicious for pulmonary edema. Small pleural effusions.
No confluent airspace disease. Reverse left shoulder arthroplasty.
No acute osseous abnormalities are seen.
IMPRESSION: 1. Cardiomegaly and small pleural effusions. Interstitial thickening
suspicious for pulmonary edema. Findings suspicious for CHF.
2. Hiatal hernia.

## 2021-05-10 IMAGING — US US ABDOMEN LIMITED
1 series · 14 of 25 positions shown · non-contrast
Comparison: None.

CLINICAL DATA: Abnormal transaminases.

EXAM:
ULTRASOUND ABDOMEN LIMITED RIGHT UPPER QUADRANT

[Series 1: us abdomen limited ruq · 14 of 34 slices shown]
[im 1/34]
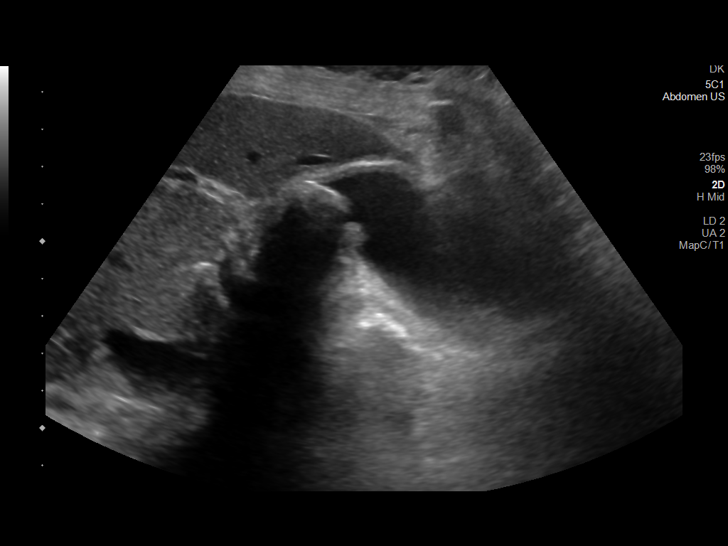
[im 3/34]
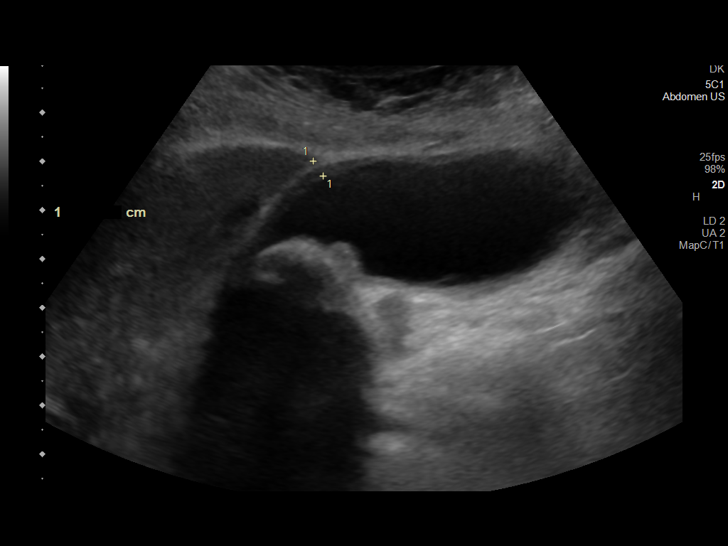
[im 6/34]
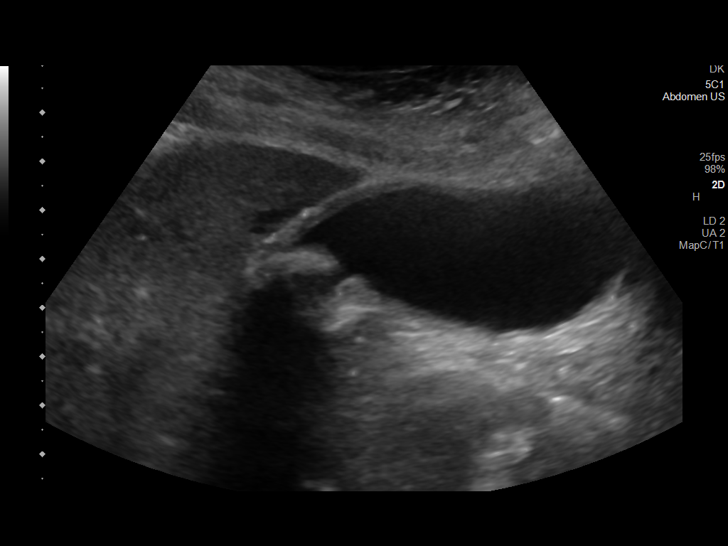
[im 9/34]
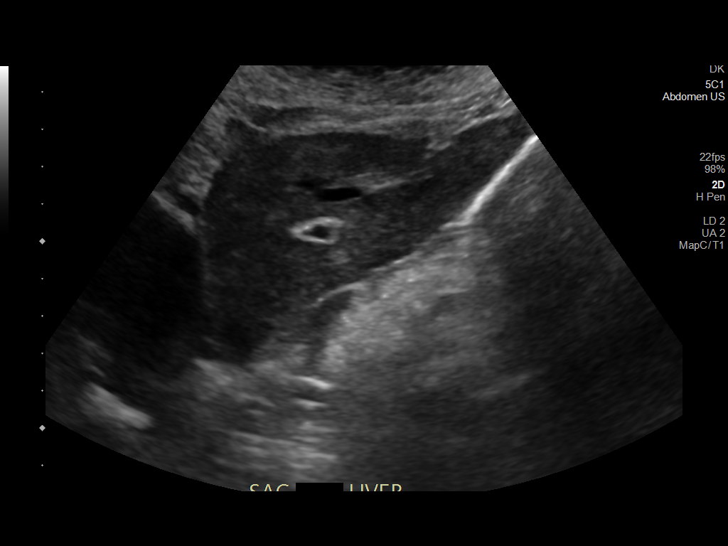
[im 12/34]
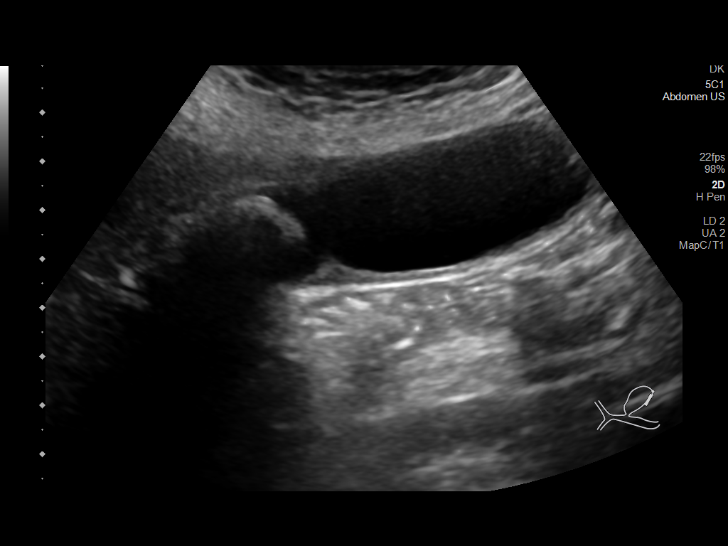
[im 13/34]
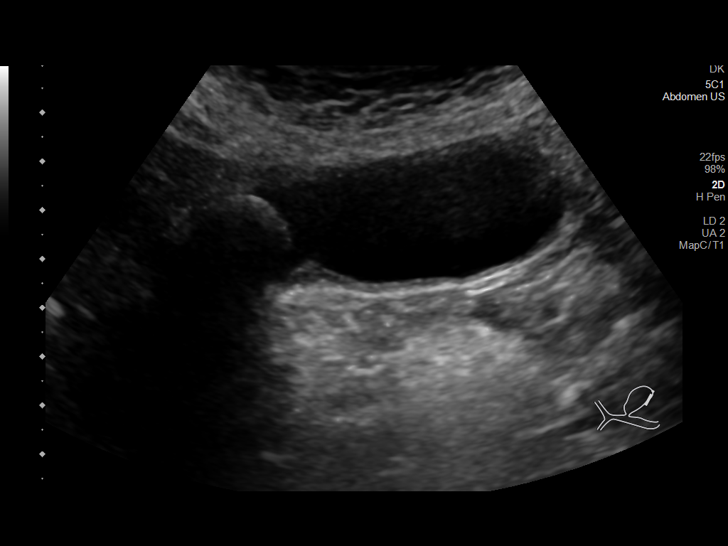
[im 16/34]
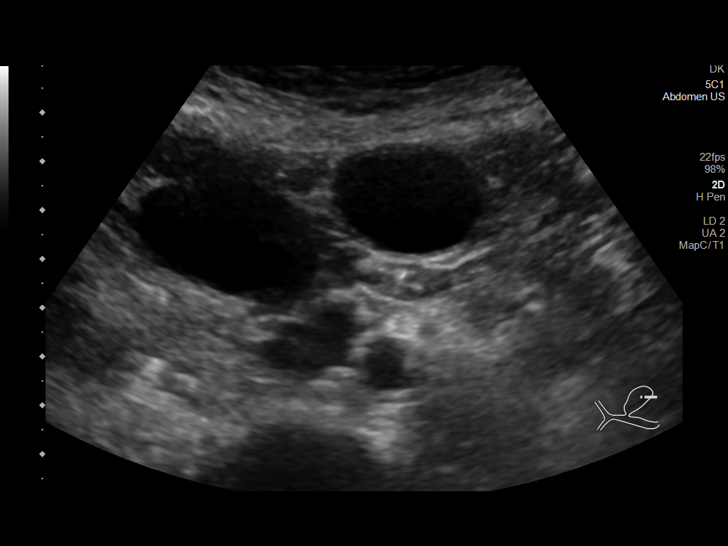
[im 18/34]
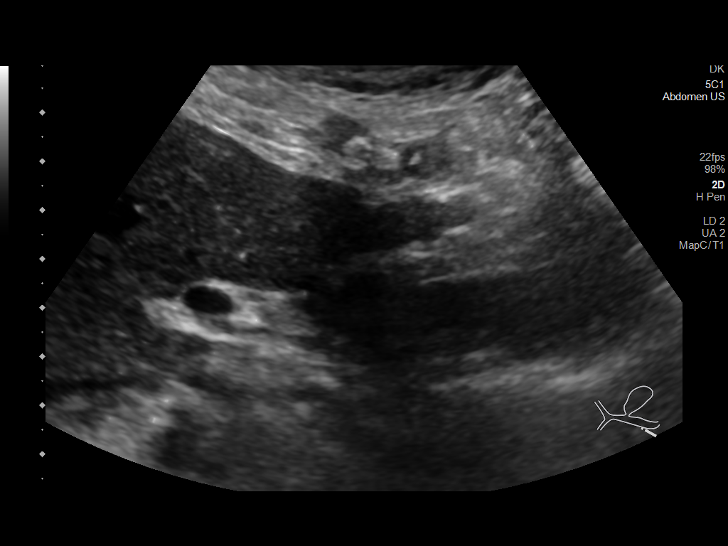
[im 21/34]
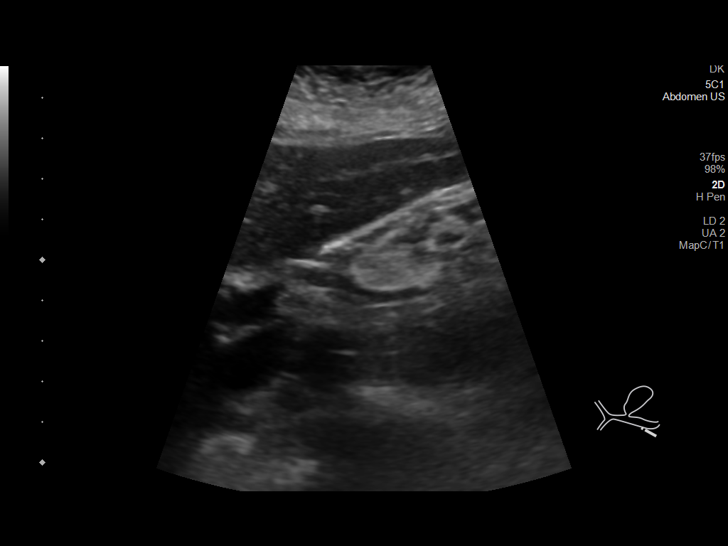
[im 23/34]
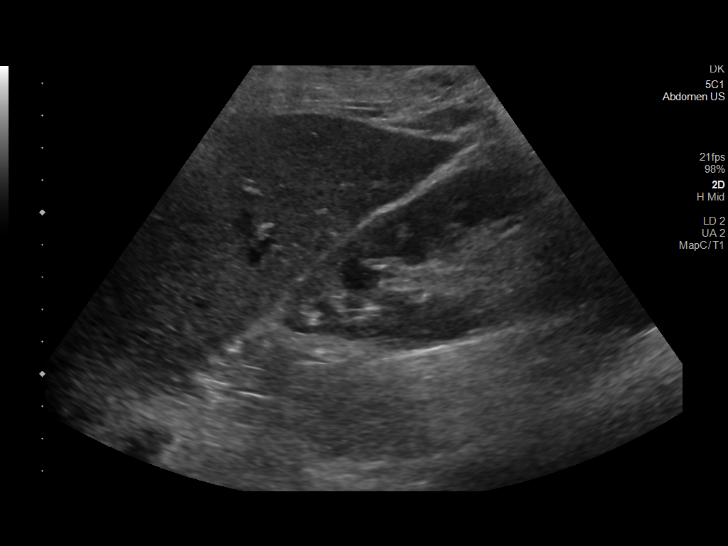
[im 25/34]
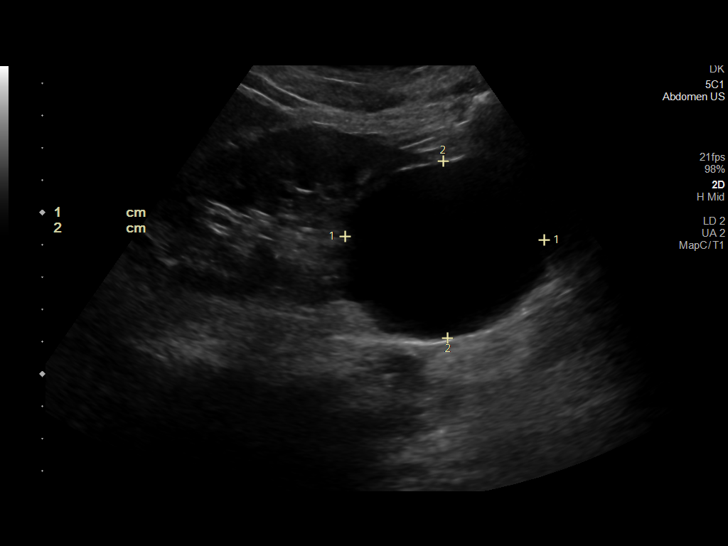
[im 28/34]
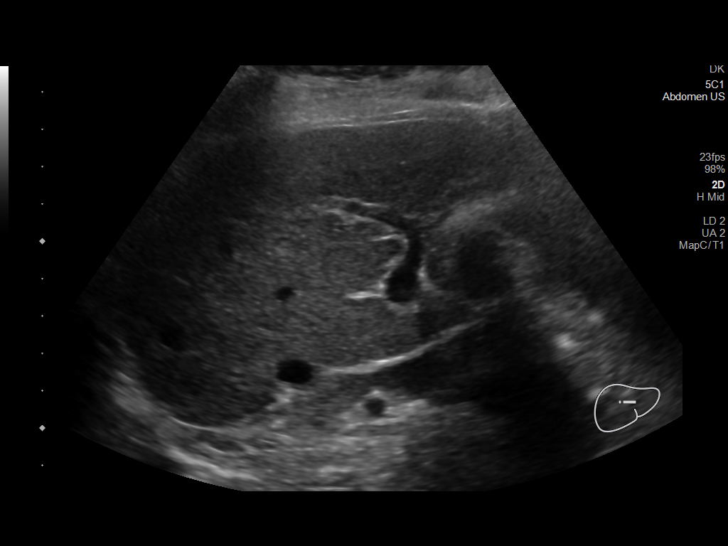
[im 31/34]
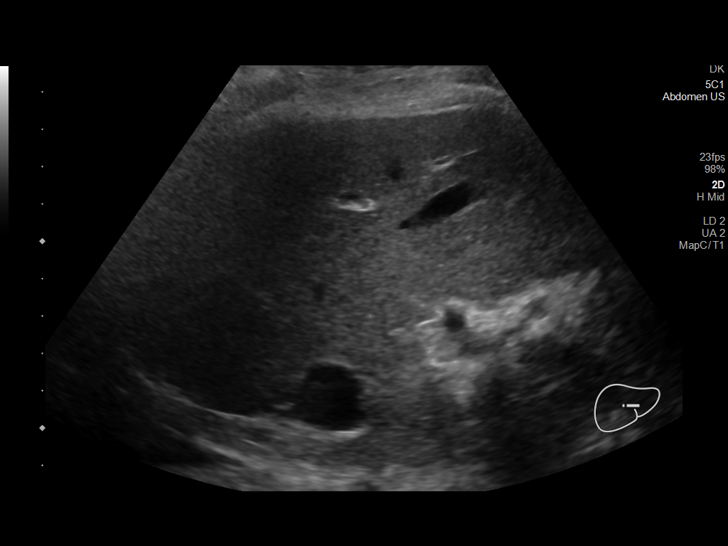
[im 34/34]
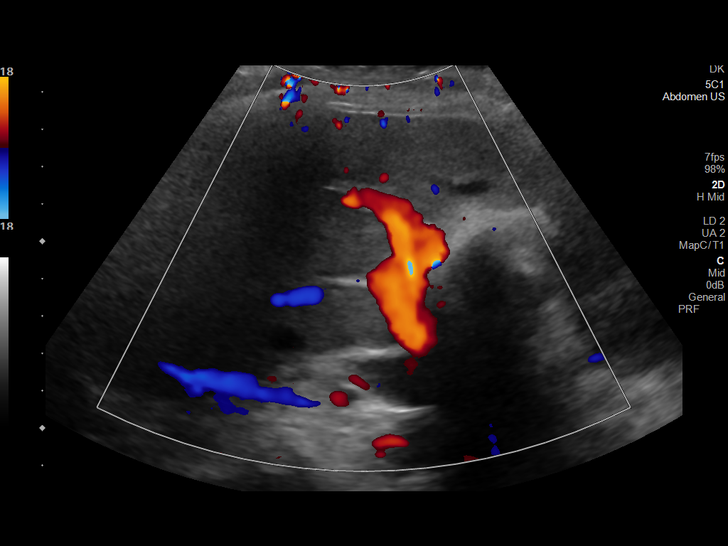

[14 of 25 positions shown; findings below may reference images not displayed]

FINDINGS: Gallbladder:

There is a non mobile stone in the gallbladder neck measuring
cm. There is gallbladder sludge. Borderline gallbladder wall
thickening of 3 mm. The sonographic Murphy's sign is negative. No
pericholecystic fluid.

Common bile duct:

Diameter: 5.1 mm

Liver:

No focal lesion identified. Increased parenchymal echogenicity.
Portal vein is patent on color Doppler imaging with normal direction
of blood flow towards the liver.

Other: Exophytic right renal cyst measures 6.2 x 5.5 x 6.1 cm.

Small right pleural effusion.
IMPRESSION: None mobile 2.3 cm gallstone within the gallbladder neck with
nonspecific borderline gallbladder wall thickening. No definite
evidence of acute cholecystitis.

Diffusely increased parenchymal echogenicity of the liver, usually
associated with hepatic steatosis or fibrosis.

## 2021-07-13 ENCOUNTER — Other Ambulatory Visit: Payer: Self-pay | Admitting: Cardiovascular Disease

## 2021-07-13 DIAGNOSIS — I5033 Acute on chronic diastolic (congestive) heart failure: Secondary | ICD-10-CM

## 2021-07-13 MED ORDER — AMIODARONE HCL 200 MG PO TABS: ORAL_TABLET | ORAL | 3 refills | Status: DC

## 2021-07-13 MED ORDER — POTASSIUM CHLORIDE CRYS ER 20 MEQ PO TBCR: 20.0000 meq | EXTENDED_RELEASE_TABLET | Freq: Every day | ORAL | 3 refills | Status: DC

## 2021-07-13 MED ORDER — FUROSEMIDE 20 MG PO TABS: 20.0000 mg | ORAL_TABLET | Freq: Every morning | ORAL | 3 refills | Status: DC

## 2021-07-13 NOTE — Telephone Encounter (Signed)
Pt's medication was phone into [place] mail order pharmacy, because medication was unable to be sent electronically. Gave verbal order over the phone for Amiodarone, potassium chloride and furosemide. Pharmacist verbalized understanding.

## 2021-07-15 ENCOUNTER — Ambulatory Visit (INDEPENDENT_AMBULATORY_CARE_PROVIDER_SITE_OTHER): Payer: Medicare HMO

## 2021-07-15 DIAGNOSIS — I495 Sick sinus syndrome: Secondary | ICD-10-CM | POA: Diagnosis not present

## 2021-07-15 LAB — CUP PACEART REMOTE DEVICE CHECK
Battery Remaining Longevity: 145 mo
Battery Voltage: 3.04 V
Brady Statistic AP VP Percent: 0.16 %
Brady Statistic AP VS Percent: 81.13 %
Brady Statistic AS VP Percent: 0 %
Brady Statistic AS VS Percent: 18.71 %
Brady Statistic RA Percent Paced: 81.55 %
Brady Statistic RV Percent Paced: 0.16 %
Date Time Interrogation Session: 20230216205942
Implantable Lead Implant Date: 20091201
Implantable Lead Implant Date: 20091209
Implantable Lead Location: 753859
Implantable Lead Location: 753860
Implantable Lead Model: 4076
Implantable Lead Model: 4076
Implantable Pulse Generator Implant Date: 20210521
Lead Channel Impedance Value: 323 Ohm
Lead Channel Impedance Value: 380 Ohm
Lead Channel Impedance Value: 684 Ohm
Lead Channel Impedance Value: 741 Ohm
Lead Channel Pacing Threshold Amplitude: 0.5 V
Lead Channel Pacing Threshold Amplitude: 0.5 V
Lead Channel Pacing Threshold Pulse Width: 0.4 ms
Lead Channel Pacing Threshold Pulse Width: 0.4 ms
Lead Channel Sensing Intrinsic Amplitude: 11.625 mV
Lead Channel Sensing Intrinsic Amplitude: 11.625 mV
Lead Channel Sensing Intrinsic Amplitude: 2.75 mV
Lead Channel Sensing Intrinsic Amplitude: 2.75 mV
Lead Channel Setting Pacing Amplitude: 1.5 V
Lead Channel Setting Pacing Amplitude: 2.5 V
Lead Channel Setting Pacing Pulse Width: 0.4 ms
Lead Channel Setting Sensing Sensitivity: 5.6 mV

## 2021-07-20 NOTE — Progress Notes (Signed)
Remote pacemaker transmission.   

## 2021-07-21 IMAGING — CR DG CHEST 2V
2 series · 2 of 2 positions shown · non-contrast
Comparison: November 22, 2017

CLINICAL DATA: Shortness of breath

EXAM:
CHEST - 2 VIEW

[chest pa]
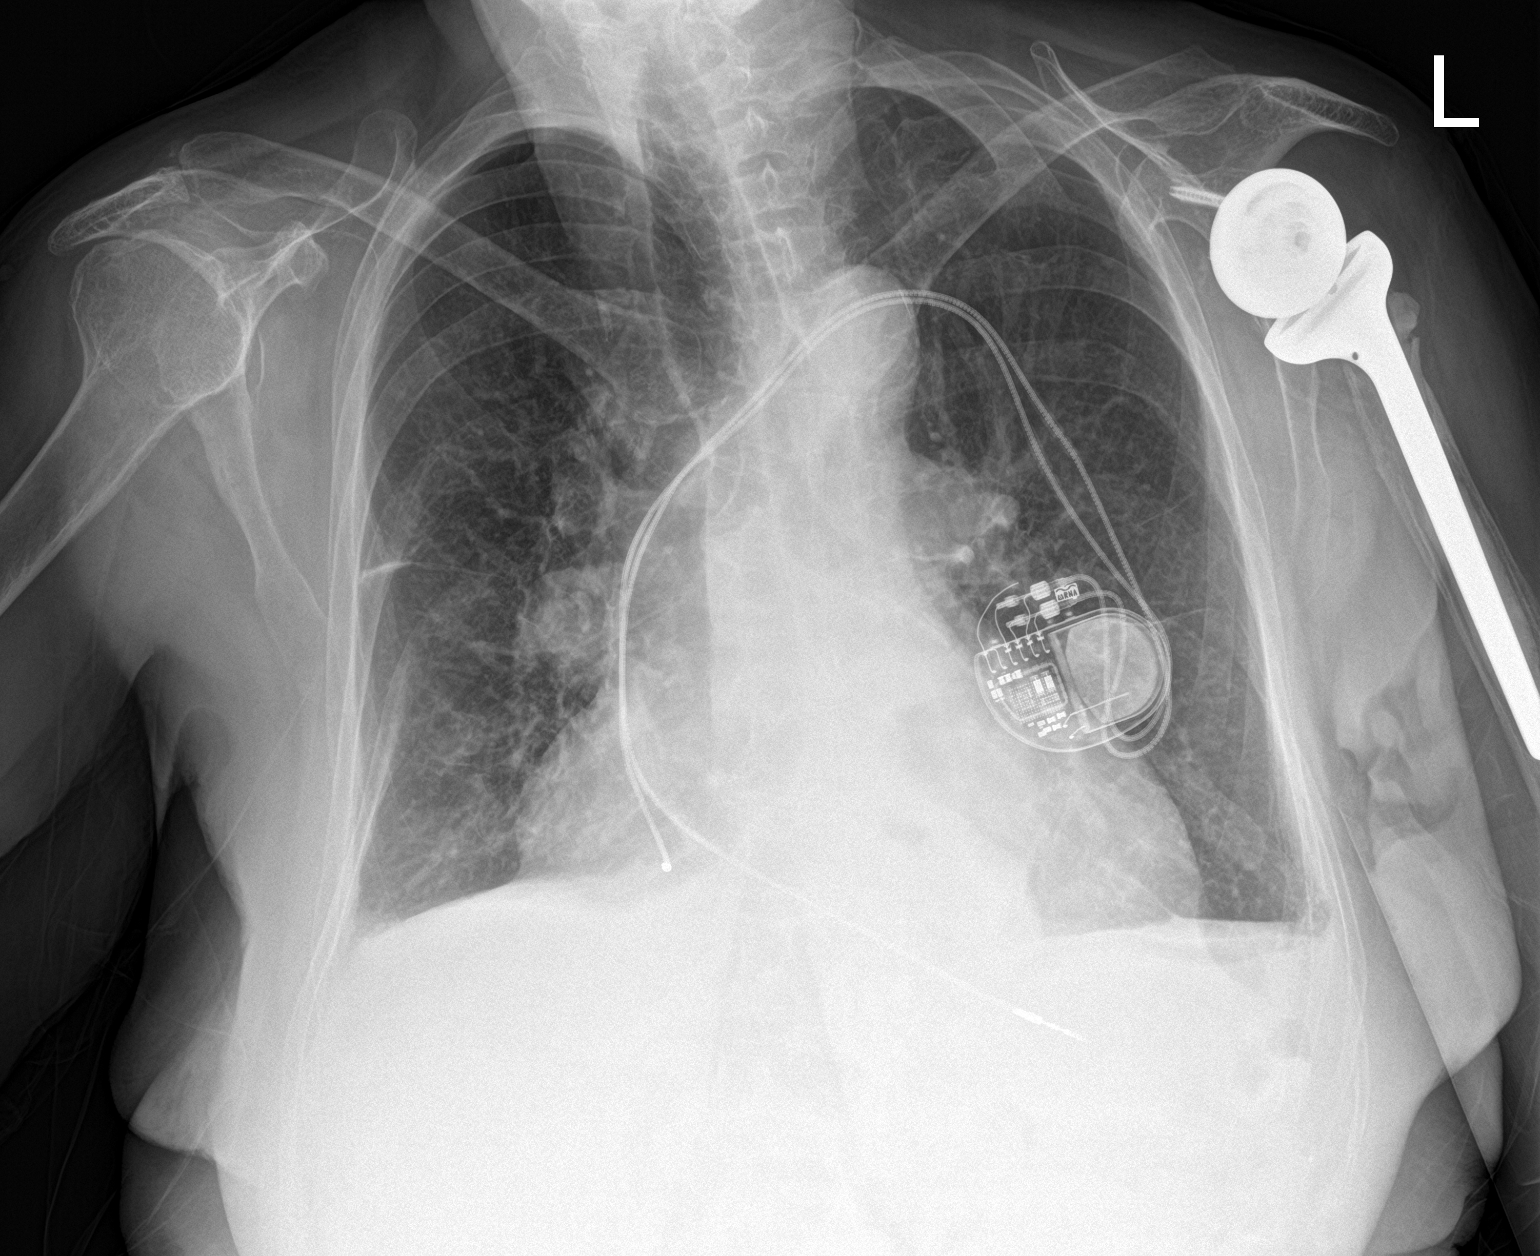

[chest lat]
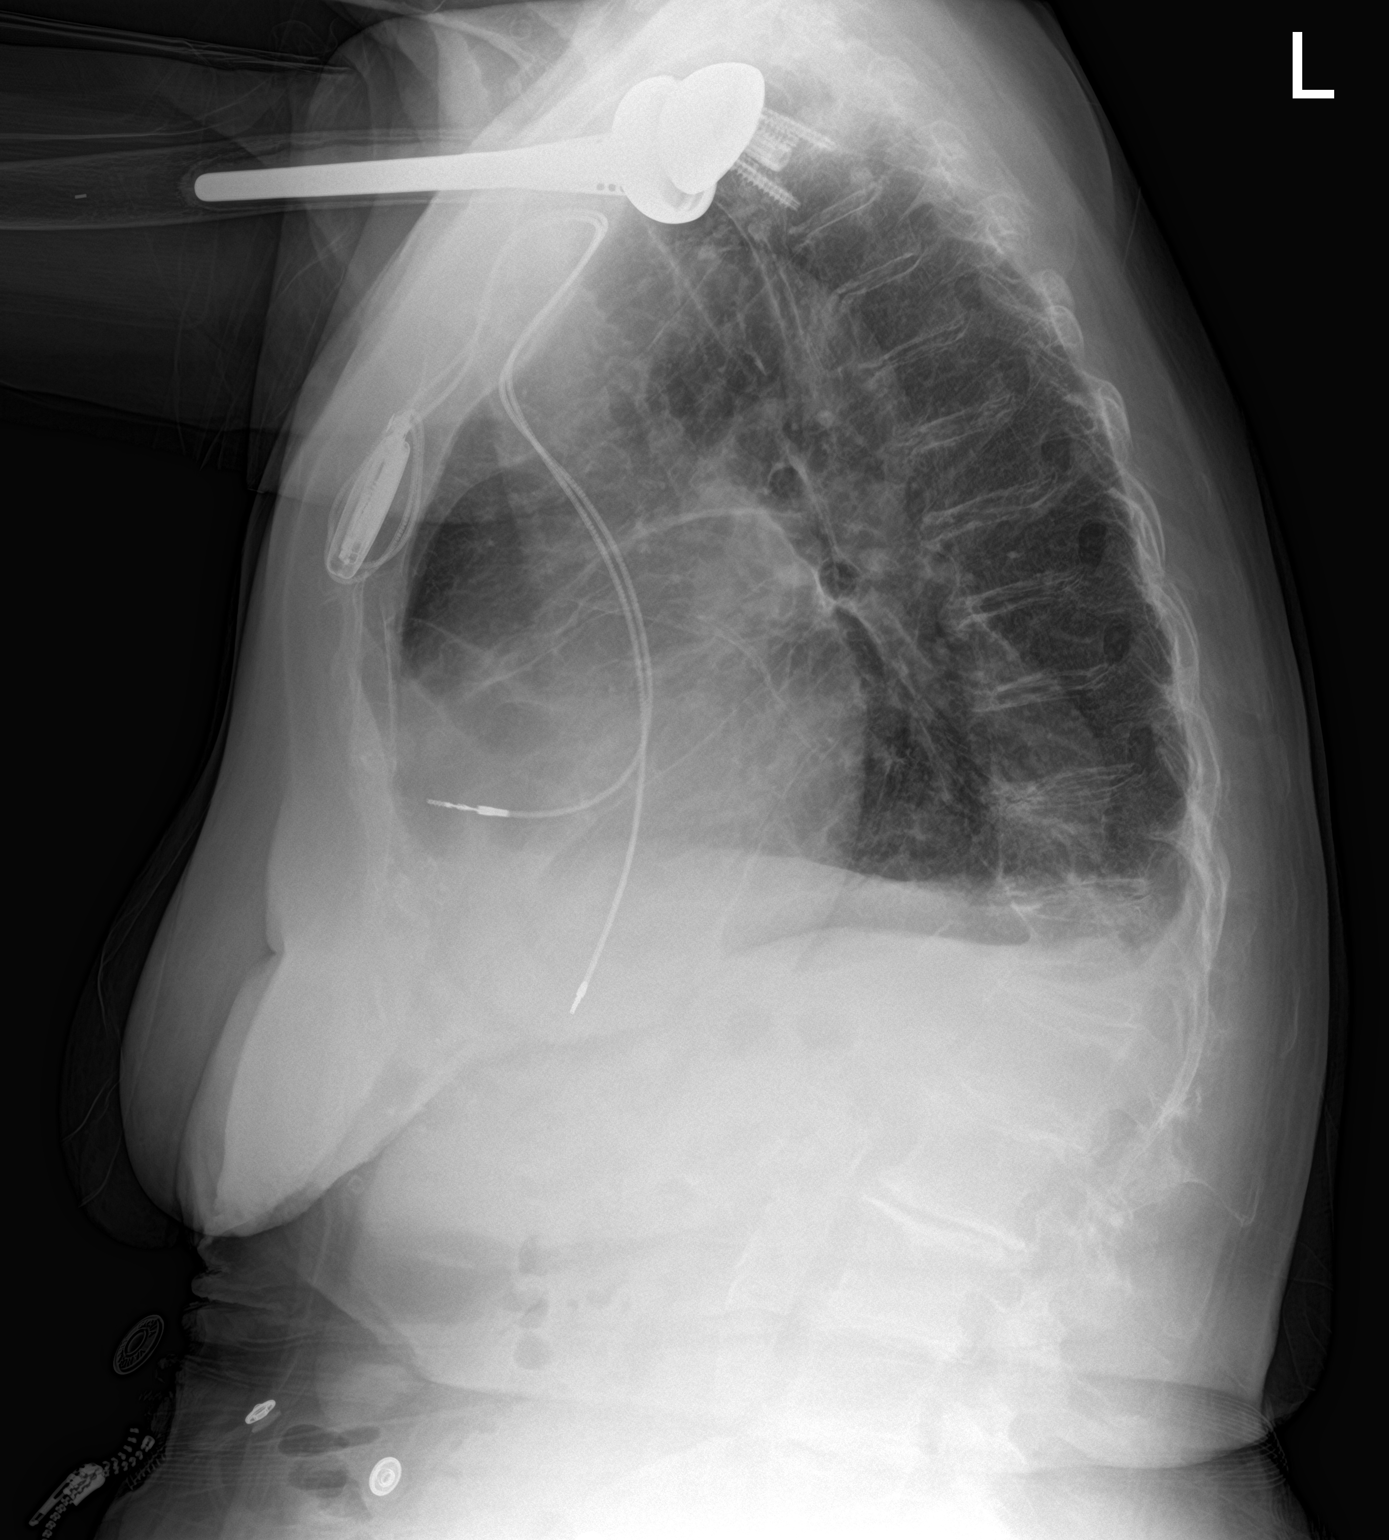

[2 of 2 positions shown; findings below may reference images not displayed]

FINDINGS: There is a dual chamber left-sided pacemaker in place. The heart
size is stable but enlarged. Aortic calcifications are noted. There
is no pneumothorax. No large pleural effusion. There is blunting of
the costophrenic angles bilaterally which is stable. The patient is
status post total shoulder arthroplasty on the left.
IMPRESSION: Stable cardiomegaly with small bilateral pleural effusions.

## 2021-07-26 ENCOUNTER — Other Ambulatory Visit: Payer: Self-pay | Admitting: Physician Assistant

## 2021-07-26 DIAGNOSIS — Z952 Presence of prosthetic heart valve: Secondary | ICD-10-CM

## 2021-08-19 ENCOUNTER — Ambulatory Visit (HOSPITAL_COMMUNITY): Payer: Medicare HMO | Attending: Internal Medicine

## 2021-08-19 ENCOUNTER — Ambulatory Visit: Payer: Medicare HMO

## 2021-08-19 NOTE — Progress Notes (Deleted)
?[facility]   ?[place] ?                                    ?Cardiology Office Note:   ? ?Date:  08/19/2021  ? ?ID:  Kaitlyn [name], DOB 1940/12/06, MRN [id number] ? ?PCP:  Jacelyn Pi, Lilia Argue, MD  ?[facility] Cardiologist:  Mertie Moores, MD / Dr. Angelena Form & Dr. Cyndia Bent (TAVR) ?[facility] Electrophysiologist:  Virl Axe, MD  ? ?Referring MD: Jacelyn Pi, Lilia Argue, *  ? ?1 year s/p TAVR ? ?History of Present Illness:   ? ?Kaitlyn [name] is a 81 y.o. 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  ? ?She suffered a stroke in 05/2019 resulting in dysarthria and confusion. MRI showed a chronic hemorrhagic infarct in the right frontal lobe with surrounding subacute infarct. She also had problems with acute hepatic encephalopathy at that time with elevated ammonia level.  2D echocardiogram in May 2021 showed a severely calcified aortic valve with severely restricted mobility.  The mean gradient was 21 mmHg with a peak gradient of 32 mmHg.  Aortic valve area by VTI was 0.48 cm?.  Dimensionless index was 0.17.  Left ventricular ejection fraction was mildly decreased at 50% with global hypokinesis and mild LVH. She was admitted to [facility] in September 2021 with shortness of breath and found to be in atrial fibrillation with rapid ventricular response.  Echocardiogram at that time showed ejection fraction of 35 to 40%.  She was transferred to [facility] and had an echo on 02/09/2020 showing severely thickened aortic valve leaflets with severely restricted leaflet mobility.  The mean gradient was measured at 29 mmHg with an aortic valve area of 0.55 cm?.  EF was 55 to 60% at that time. There was no  evidence of left atrial appendage thrombus and she was cardioverted to sinus rhythm.  She underwent cardiac catheterization in September 2021 that showed no evidence of coronary disease.  Repeat echo in 06/2020 showed EF 60-65%, moderate LVH, and severe AS with a mean gradient of 41 mm Hg and AVA 0.49cm2 & DVI 0.17. The patient was seen back by Dr. Acie Fredrickson and reported a history of progressive exertional fatigue and shortness of breath. ? ?Shee was evaluated by the multidisciplinary valve team and underwent successful TAVR with a 23 mm Edwards Sapien 3 Ultra THV via the TF approach on 10/12/2020. Post operative echo showed EF 60%, normally functioning TAVR with a mean gradient of 12.7 mmHg and no PVL. She was resumed on home Eliquis with the addition of a baby aspirin 81mg  daily x 6 months. She had a marked symptomatic improvement since TAVR. 1 month echo showed EF 60%, normally functioning TAVR with a mean gradient of 10 mm hg and no PVL. ? ?Today she presents to clinic for follow up.  ? ? ?Past Medical History:  ?Diagnosis Date  ? Arthritis   ? both knees  ? Chronic pain   ? History of GI bleed   ? History of stroke 05/2019  ? Persistent atrial fibrillation ([place])   ? Presence of permanent cardiac pacemaker   ? placed for SSS- Medtronic device   ?  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 s/p TAVR with a 9mm Edwards S3U via the TF approach by Dr. Angelena Form & Dr. Cyndia Bent   ? Severe aortic stenosis   ? ? ?Past Surgical History:  ?Procedure Laterality Date  ? CERVICAL SPINE SURGERY    ? EYE SURGERY Left   ? PACEMAKER IMPLANT    ? PPM GENERATOR CHANGEOUT N/A 10/17/2019  ? Procedure: PPM GENERATOR CHANGEOUT;  Surgeon: Deboraha Sprang, MD;  Location: [facility];  Service: Cardiovascular;  Laterality: N/A;  ? shoulder replacement Left   ? TEE WITHOUT CARDIOVERSION N/A 10/12/2020  ? Procedure: TRANSESOPHAGEAL ECHOCARDIOGRAM (TEE);  Surgeon: Burnell Blanks, MD;  Location: [place];   Service: Open Heart Surgery;  Laterality: N/A;  ? TRANSCATHETER AORTIC VALVE REPLACEMENT, TRANSFEMORAL N/A 10/12/2020  ? Procedure: TRANSCATHETER AORTIC VALVE REPLACEMENT, TRANSFEMORAL;  Surgeon: Burnell Blanks, MD;  Location: [place] [facility];  Service: Open Heart Surgery;  Laterality: N/A;  ? ? ?Current Medications: ?No outpatient medications have been marked as taking for the 08/19/21 encounter (Appointment) with [facility].  ?  ? ?Allergies:   Lisinopril  ? ?Social History  ? ?Socioeconomic History  ? Marital status: Married  ?  Spouse name: Not on file  ? Number of children: 0  ? Years of education: Not on file  ? Highest education level: Not on file  ?Occupational History  ? Occupation: Optometrist  ?Tobacco Use  ? Smoking status: Every Day  ?  Types: E-cigarettes  ? Smokeless tobacco: Never  ? Tobacco comments:  ?  vaping all day  ?Vaping Use  ? Vaping Use: Every day  ? Start date: 10/27/2013  ?Substance and Sexual Activity  ? Alcohol use: Never  ? Drug use: Never  ? Sexual activity: Not on file  ?Other Topics Concern  ? Not on file  ?Social History Narrative  ? Not on file  ? ?Social Determinants of Health  ? ?Financial Resource Strain: Not on file  ?Food Insecurity: Not on file  ?Transportation Needs: Not on file  ?Physical Activity: Not on file  ?Stress: Not on file  ?Social Connections: Not on file  ?  ? ?Family History: ?The patient's family history includes Alzheimer's disease in her mother; Stroke in her father. ? ?ROS:   ?Please see the history of present illness.    ?All other systems reviewed and are negative. ? ?EKGs/Labs/Other Studies Reviewed:   ? ?The following studies were reviewed today: ? ? ?TAVR OPERATIVE NOTE ?  ?  ?Date of Procedure:                10/12/2020 ?  ?Preoperative Diagnosis:      Severe Aortic Stenosis  ?  ?Postoperative Diagnosis:    Same  ?  ?Procedure:      ?  ?Transcatheter Aortic Valve Replacement - Percutaneous Left Transfemoral Approach ?             Edwards Sapien 3 Ultra THV (size 23 mm, model # C6110506, serial # [id number]) ?             ?Co-Surgeons:                        Gaye Pollack, MD and Lauree Chandler, MD ?  ?  ?Anesthesiologist:                  Renaldo Reel, MD ?  ?Echocardiographer:  Osborne Oman ?  ?Pre-operative Echo Findings: ?Severe aortic stenosis ?Normal left ventricular systolic function ?  ?Post-operative Echo Findings: ?No paravalvular leak ?Normal left ventricular systolic function ?  ?_____________ ?  ?Echo 10/13/2020: ?IMPRESSIONS  ? 1. The aortic valve has been repaired/replaced. Aortic valve  ?regurgitation is not visualized. There is a 23 mm Edwards Sapien  ?prosthetic (TAVR) valve present in the aortic position. Procedure Date:  ?10/12/2020. Aortic valve mean gradient measures 12.7  ?mmHg. Aortic valve Vmax measures 2.41 m/s. Aortic valve acceleration time  ?measures 88 msec. DVI 0.48; No significant para-valvular leak.  ? 2. Left ventricular ejection fraction, by estimation, is 60%. The left  ?ventricle has normal function. The left ventricle has no regional wall  ?motion abnormalities. There is moderate concentric left ventricular  ?hypertrophy. Left ventricular diastolic  ?parameters are indeterminate.  ? 3. Right ventricular systolic function is normal. The right ventricular  ?size is normal. There is normal pulmonary artery systolic pressure. The  ?estimated right ventricular systolic pressure is [id number] mmHg.  ? 4. Left atrial size was severely dilated.  ? 5. The mitral valve is grossly normal. No evidence of mitral valve  ?regurgitation.  ? 6. The inferior vena cava is normal in size with greater than 50%  ?respiratory variability, suggesting right atrial pressure of 3 mmHg.  ? ?Comparison(s): A prior study was performed on 10/12/20. No significant  ?change from prior study. Prior images reviewed side by side. Compared to  ?prior no significant paravalular leak; gradients not suggestive of  ?prosthetic  dysfunction, and tricuspid  ?regurgitation has improved through series.  ? ?_________________________ ? ?Echo 11/25/20 ?IMPRESSIONS ? 1. The aortic valve has been repaired/replaced. Aortic valve regur

## 2021-08-22 ENCOUNTER — Encounter (HOSPITAL_COMMUNITY): Payer: Self-pay | Admitting: Physician Assistant

## 2021-09-14 ENCOUNTER — Ambulatory Visit (HOSPITAL_COMMUNITY): Payer: Medicare HMO | Attending: Internal Medicine

## 2021-09-14 ENCOUNTER — Ambulatory Visit: Payer: Medicare HMO

## 2021-09-14 NOTE — Progress Notes (Deleted)
HEART AND [place]                                     Cardiology Office Note:    Date:  09/14/2021   ID:  [name], DOB [birth year], MRN [id number]  PCP:  Jacelyn Pi, Lilia Argue, MD  [facility] Cardiologist:  Mertie Moores, MD / Dr. Angelena Form & Dr. Cyndia Bent (TAVR) [facility] Electrophysiologist:  Virl Axe, MD   Referring MD: Jacelyn Pi, Lilia Argue, *   1 year s/p TAVR  History of Present Illness:    Kaitlyn [name] is a 81 y.o. 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   She suffered a stroke in January 2021 resulting in dysarthria and confusion. MRI showed a chronic hemorrhagic infarct in the right frontal lobe with surrounding subacute infarct.  She also had problems with acute hepatic encephalopathy at that time with elevated ammonia level.  2D echocardiogram in May 2021 showed a severely calcified aortic valve with severely restricted mobility.  The mean gradient was 21 mmHg with a peak gradient of 32 mmHg.  Aortic valve area by VTI was 0.48 cm.  Dimensionless index was 0.17.  Left ventricular ejection fraction was mildly decreased at 50% with global hypokinesis and mild LVH. She was admitted to [facility] in September 2021 with shortness of breath and found to be in atrial fibrillation with rapid ventricular response.  Echocardiogram at that time showed ejection fraction of 35 to 40%.  She was transferred to [facility] and had an echo on 02/09/2020 showing severely thickened aortic valve leaflets with severely restricted leaflet mobility.  The mean gradient was measured at 29 mmHg with an aortic valve area of 0.55 cm.  Ejection fraction was 55 to 60% at  that time.  There was no evidence of left atrial appendage thrombus and she was cardioverted to sinus rhythm.  She underwent cardiac catheterization in September 2021 that showed no evidence of coronary disease.  Repeat echo in 06/2020 showed EF 60-65%, moderate LVH, and severe AS with a mean gradient of 41 mm Hg and AVA 0.49cm2 & DVI 0.17. The patient was seen back by Dr. Acie Fredrickson and reported a history of progressive exertional fatigue and shortness of breath since around January.   She was evaluated by the multidisciplinary valve team and underwent successful TAVR with a 23 mm Edwards Sapien 3 Ultra THV via the TF approach on 10/12/2020. Post operative echo showed EF 60%, normally functioning TAVR with a mean gradient of 12.7 mmHg and no PVL. She was resumed on home Eliquis with the addition of a baby aspirin 81mg  daily x 6 months.   She was seen by Vick Frees PA on 11/24/20 and feeling great. Her amio was decreased given pt preference. Afib 4.7% on device. 1 month echo showed EF 60%, normally functioning TAVR with a mean gradient of 10 mm hg and no PVL.   Today she presents to clinic for follow up.    Past Medical History:  Diagnosis Date   Arthritis    both knees   Chronic pain    History of GI bleed  History of stroke 05/2019   Persistent atrial fibrillation (HCC)    Presence of permanent cardiac pacemaker    placed for SSS- Medtronic device    S/P TAVR (transcatheter aortic valve replacement) 10/12/2020   s/p TAVR with a 32mm Edwards S3U via the TF approach by Dr. Angelena Form & Dr. Cyndia Bent    Severe aortic stenosis     Past Surgical History:  Procedure Laterality Date   CERVICAL SPINE SURGERY     EYE SURGERY Left    PACEMAKER IMPLANT     PPM GENERATOR CHANGEOUT N/A 10/17/2019   Procedure: PPM GENERATOR CHANGEOUT;  Surgeon: Deboraha Sprang, MD;  Location: [place] [facility];  Service: Cardiovascular;  Laterality: N/A;   shoulder replacement Left    TEE WITHOUT CARDIOVERSION N/A  10/12/2020   Procedure: TRANSESOPHAGEAL ECHOCARDIOGRAM (TEE);  Surgeon: Burnell Blanks, MD;  Location: [place] [facility];  Service: Open Heart Surgery;  Laterality: N/A;   TRANSCATHETER AORTIC VALVE REPLACEMENT, TRANSFEMORAL N/A 10/12/2020   Procedure: TRANSCATHETER AORTIC VALVE REPLACEMENT, TRANSFEMORAL;  Surgeon: Burnell Blanks, MD;  Location: [place] [facility];  Service: Open Heart Surgery;  Laterality: N/A;    Current Medications: No outpatient medications have been marked as taking for the 09/14/21 encounter (Appointment) with [facility].     Allergies:   Lisinopril   Social History   Socioeconomic History   Marital status: Married    Spouse name: Not on file   Number of children: 0   Years of education: Not on file   Highest education level: Not on file  Occupational History   Occupation: Accountant  Tobacco Use   Smoking status: Every Day    Types: E-cigarettes   Smokeless tobacco: Never   Tobacco comments:    vaping all day  Vaping Use   Vaping Use: Every day   Start date: 10/27/2013  Substance and Sexual Activity   Alcohol use: Never   Drug use: Never   Sexual activity: Not on file  Other Topics Concern   Not on file  Social History Narrative   Not on file   Social Determinants of Health   Financial Resource Strain: Not on file  Food Insecurity: Not on file  Transportation Needs: Not on file  Physical Activity: Not on file  Stress: Not on file  Social Connections: Not on file     Family History: The patient's family history includes Alzheimer's disease in her mother; Stroke in her father.  ROS:   Please see the history of present illness.    All other systems reviewed and are negative.  EKGs/Labs/Other Studies Reviewed:    The following studies were reviewed today:   TAVR OPERATIVE NOTE     Date of Procedure:                10/12/2020   Preoperative Diagnosis:      Severe Aortic Stenosis    Postoperative  Diagnosis:    Same    Procedure:        Transcatheter Aortic Valve Replacement - Percutaneous Left Transfemoral Approach             Edwards Sapien 3 Ultra THV (size 23 mm, model # [id number], serial # [id number])              Co-Surgeons:                        Gaye Pollack, MD and Lauree Chandler,  MD     Anesthesiologist:                  Renaldo Reel, MD   Echocardiographer:              Osborne Oman   Pre-operative Echo Findings: Severe aortic stenosis Normal left ventricular systolic function   Post-operative Echo Findings: No paravalvular leak Normal left ventricular systolic function   _____________   Echo 10/13/2020: IMPRESSIONS   1. The aortic valve has been repaired/replaced. Aortic valve  regurgitation is not visualized. There is a 23 mm Edwards Sapien  prosthetic (TAVR) valve present in the aortic position. Procedure Date:  10/12/2020. Aortic valve mean gradient measures 12.7  mmHg. Aortic valve Vmax measures 2.41 m/s. Aortic valve acceleration time  measures 88 msec. DVI 0.48; No significant para-valvular leak.   2. Left ventricular ejection fraction, by estimation, is 60%. The left  ventricle has normal function. The left ventricle has no regional wall  motion abnormalities. There is moderate concentric left ventricular  hypertrophy. Left ventricular diastolic  parameters are indeterminate.   3. Right ventricular systolic function is normal. The right ventricular  size is normal. There is normal pulmonary artery systolic pressure. The  estimated right ventricular systolic pressure is Q000111Q mmHg.   4. Left atrial size was severely dilated.   5. The mitral valve is grossly normal. No evidence of mitral valve  regurgitation.   6. The inferior vena cava is normal in size with greater than 50%  respiratory variability, suggesting right atrial pressure of 3 mmHg.   Comparison(s): A prior study was performed on 10/12/20. No significant  change from prior study.  Prior images reviewed side by side. Compared to  prior no significant paravalular leak; gradients not suggestive of  prosthetic dysfunction, and tricuspid  regurgitation has improved through series.   _________________________  Echo 11/25/20 IMPRESSIONS  1. The aortic valve has been repaired/replaced. Aortic valve regurgitation is not visualized. No aortic stenosis is present. There is a 23 mm Edwards Sapien prosthetic (TAVR) valve present in the aortic position. Procedure Date: 10/12/2020. Echo findings  are consistent with normal structure and function of the aortic valve prosthesis. Aortic valve mean gradient measures 10.0 mmHg. Aortic valve Vmax measures 2.16 m/s. Aortic valve acceleration time measures 75 msec.  2. Left ventricular ejection fraction, by estimation, is 60 to 65%. The left ventricle has normal function. The left ventricle has no regional wall motion abnormalities. There is mild left ventricular hypertrophy. Left ventricular diastolic parameters are consistent with Grade II diastolic dysfunction (pseudonormalization).  3. Right ventricular systolic function is normal. The right ventricular size is normal. There is normal pulmonary artery systolic pressure. The estimated right ventricular systolic pressure is Q000111Q mmHg.  4. Left atrial size was mildly dilated.  5. The mitral valve is normal in structure. Trivial mitral valve regurgitation. No evidence of mitral stenosis.  6. The inferior vena cava is normal in size with greater than 50% respiratory variability, suggesting right atrial pressure of 3 mmHg.  __________________________  Echo 09/14/21 ***   EKG:  EKG is NOT ordered today.    Recent Labs: 10/08/2020: ALT 23 10/13/2020: Hemoglobin 12.4; Magnesium 1.7; Platelets 168 04/19/2021: BUN 12; Creatinine, Ser 0.79; Potassium 4.3; Sodium 141  Recent Lipid Panel    Component Value Date/Time   CHOL 139 11/13/2019 1136   TRIG 113 11/13/2019 1136   HDL 44 11/13/2019 1136    CHOLHDL 3.2 11/13/2019 1136   LDLCALC 74 11/13/2019 1136  Risk Assessment/Calculations:       Physical Exam:    VS:  LMP  (LMP Unknown)     Wt Readings from Last 3 Encounters:  04/19/21 127 lb 3.2 oz (57.7 kg)  11/25/20 122 lb 12.8 oz (55.7 kg)  11/24/20 122 lb 6.4 oz (55.5 kg)     GEN:  Well nourished, well developed in no acute distress HEENT: Normal NECK: No JVD; No carotid bruits LYMPHATICS: No lymphadenopathy CARDIAC: RRR, no murmurs, rubs, gallops RESPIRATORY:  Clear to auscultation without rales, wheezing or rhonchi  ABDOMEN: Soft, non-tender, non-distended MUSCULOSKELETAL:  No edema; No deformity  SKIN: Warm and dry.  Groin sites clear without hematoma or ecchymosis  NEUROLOGIC:  Alert and oriented x 3 PSYCHIATRIC:  Normal affect   ASSESSMENT:    1. S/P TAVR (transcatheter aortic valve replacement)   2. S/P placement of cardiac pacemaker   3. Paroxysmal atrial fibrillation (HCC)     PLAN:    In order of problems listed above:  Severe AS s/p TAVR:    She has NYHA class I symptoms with a marked clinical improvement since TAVR. SBE prophylaxis discussed; she has amoxicillin. Continue on Eliquis and aspirin. Stop asprin  I will see her back in 1 year for follow up and echo.    S/p PPM: followed by Dr. Caryl Comes   Paroxysmal afib: seen in device clinic earlier this week. Noted to have 4.7% burden. Amiodarone reduced to 100mg  M-F, none Sat/sun per patient wishes. Appears to be in sinus today on echo tracings.   Medication Adjustments/Labs and Tests Ordered: Current medicines are reviewed at length with the patient today.  Concerns regarding medicines are outlined above.  No orders of the defined types were placed in this encounter.   No orders of the defined types were placed in this encounter.    There are no Patient Instructions on file for this visit.   Signed, Angelena Form, PA-C  09/14/2021 12:38 PM    [facility]

## 2021-09-15 NOTE — Progress Notes (Signed)
?HEART AND VASCULAR CENTER   ?MULTIDISCIPLINARY HEART VALVE TEAM ? ?Virtual Visit via Telephone Note  ? ?This visit type was conducted due to national recommendations for restrictions regarding the COVID-19 Pandemic (e.g. social distancing) in an effort to limit this patient's exposure and mitigate transmission in our community.  Due to her co-morbid illnesses, this patient is at least at moderate risk for complications without adequate follow up.  This format is felt to be most appropriate for this patient at this time.  The patient did not have access to video technology/had technical difficulties with video requiring transitioning to audio format only (telephone).  All issues noted in this document were discussed and addressed.  No physical exam could be performed with this format.  Please refer to the patient's chart for her  consent to telehealth for [facility].  ? ?Evaluation Performed:  Follow-up visit ? ?Date:  09/17/2021  ? ?ID:  Kaitlyn [name], DOB 1941/02/22, MRN [id number] ? ?Patient Location: Home ?Provider Location: Office/Clinic ? ?PCP:  Jacelyn Pi, Lilia Argue, MD  ?Cardiologist:  Mertie Moores, MD  / Dr. Angelena Form & Dr. Cyndia Bent (TAVR) ?Electrophysiologist:  Virl Axe, MD  ? ?Chief Complaint:  1 year s/p TAVR follow up  ? ?History of Present Illness:   ? ?Kaitlyn [name] is a 81 y.o. 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  ? ?She suffered a stroke in January 2021 resulting in dysarthria and confusion. MRI showed a chronic hemorrhagic infarct in the right frontal lobe with surrounding subacute infarct.  She also had problems with acute hepatic encephalopathy at that time with elevated ammonia level.  2D echocardiogram in May 2021 showed a severely  calcified aortic valve with severely restricted mobility.  The mean gradient was 21 mmHg with a peak gradient of 32 mmHg.  Aortic valve area by VTI was 0.48 cm?.  Dimensionless index was 0.17.  Left ventricular ejection fraction was mildly decreased at 50% with global hypokinesis and mild LVH. She was admitted to [facility] in September 2021 with shortness of breath and found to be in atrial fibrillation with rapid ventricular response.  Echocardiogram at that time showed ejection fraction of 35 to 40%.  She was transferred to [facility] and had an echo on 02/09/2020 showing severely thickened aortic valve leaflets with severely restricted leaflet mobility.  The mean gradient was measured at 29 mmHg with an aortic valve area of 0.55 cm?.  Ejection fraction was 55 to 60% at that time.  There was no evidence of left atrial appendage thrombus and she was cardioverted to sinus rhythm.  She underwent cardiac catheterization in September 2021 that showed no evidence of coronary disease.  Repeat echo in 06/2020 showed EF 60-65%, moderate LVH, and severe AS with a mean gradient of 41 mm Hg and AVA 0.49cm2 & DVI 0.17. The patient was seen back by Dr. Acie Fredrickson and reported a history of progressive exertional fatigue and shortness of breath since around January.  ? ?She was evaluated by the multidisciplinary valve team and underwent successful TAVR with a 23 mm Edwards Sapien 3 Ultra THV via the TF approach on 10/12/2020. Post operative echo showed EF 60%, normally functioning TAVR with a mean gradient of 12.7 mmHg and no PVL. She was resumed on home Eliquis  with the addition of a baby aspirin 81mg  daily x 6 months.  ? ?She was seen by Vick Frees PA on 11/24/20 and feeling great. Her amio was decreased given pt preference. Afib 4.7% on device. 1 month echo showed EF 60%, normally functioning TAVR with a mean gradient of 10 mm hg and no PVL.  ? ?Today she presents via virtual visit. She did not show to  two appointments in [place]. Her husband finally told us that her debilitation and dementia has progressed and they cannot come to [facility] for apts anymore. The most she walks is to the bathroom and kitchen. She just gets worn out going to the grocery store. She bascially watches TV all day. She does some light house work. She can no longer do the things she used to.  ? ? ?Past Medical History:  ?Diagnosis Date  ? Arthritis   ? both knees  ? Chronic pain   ? History of GI bleed   ? History of stroke 05/2019  ? Persistent atrial fibrillation ([place])   ? Presence of permanent cardiac pacemaker   ? placed for SSS- Medtronic device   ? 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 s/p TAVR with a 40mm Edwards S3U via the TF approach by Dr. Angelena Form & Dr. Cyndia Bent   ? Severe aortic stenosis   ? ?Past Surgical History:  ?Procedure Laterality Date  ? CERVICAL SPINE SURGERY    ? EYE SURGERY Left   ? PACEMAKER IMPLANT    ? PPM GENERATOR CHANGEOUT N/A 10/17/2019  ? Procedure: PPM GENERATOR CHANGEOUT;  Surgeon: Deboraha Sprang, MD;  Location: [place] [facility];  Service: Cardiovascular;  Laterality: N/A;  ? shoulder replacement Left   ? TEE WITHOUT CARDIOVERSION N/A 10/12/2020  ? Procedure: TRANSESOPHAGEAL ECHOCARDIOGRAM (TEE);  Surgeon: Burnell Blanks, MD;  Location: [place] [facility];  Service: Open Heart Surgery;  Laterality: N/A;  ? TRANSCATHETER AORTIC VALVE REPLACEMENT, TRANSFEMORAL N/A 10/12/2020  ? Procedure: TRANSCATHETER AORTIC VALVE REPLACEMENT, TRANSFEMORAL;  Surgeon: Burnell Blanks, MD;  Location: [place] [facility];  Service: Open Heart Surgery;  Laterality: N/A;  ?  ? ?Current Meds  ?Medication Sig  ? albuterol (VENTOLIN HFA) 108 (90 Base) MCG/ACT inhaler Inhale 2 puffs into the lungs every 6 (six) hours as needed for wheezing or shortness of breath.  ? cholecalciferol (VITAMIN D3) 25 MCG (1000 UNIT) tablet Take 1,000 Units by mouth daily.  ? Coenzyme Q10 (CO Q-10) 200 MG  CAPS Take 200 mg by mouth daily.  ? ferrous sulfate 325 (65 FE) MG tablet Take 325 mg by mouth daily with breakfast.  ? Magnesium 400 MG TABS Take 400 mg by mouth daily.  ? sertraline (ZOLOFT) 25 MG tablet Take 1 tablet by mouth daily.  ? Thiamine HCl (VITAMIN B-1 PO) Take 100 mg by mouth daily.  ? [DISCONTINUED] amiodarone (PACERONE) 200 MG tablet TAKE 1/2 TABLET DAILY (NONE ON WEEKENDS)  ? [DISCONTINUED] furosemide (LASIX) 20 MG tablet Take 1 tablet (20 mg total) by mouth every morning.  ? [DISCONTINUED] potassium chloride SA (KLOR-CON M) 20 MEQ tablet Take 1 tablet (20 mEq total) by mouth daily.  ?  ? ?Allergies:   Lisinopril  ? ?Social History  ? ?Tobacco Use  ? Smoking status: Every Day  ?  Types: E-cigarettes  ? Smokeless tobacco: Never  ? Tobacco comments:  ?  vaping all day  ?Vaping Use  ? Vaping Use: Every day  ? Start date: 10/27/2013  ?  Substance Use Topics  ? Alcohol use: Never  ? Drug use: Never  ?  ? ?Family Hx: ?The patient's family history includes Alzheimer's disease in her mother; Stroke in her father. ? ?ROS:   ?Please see the history of present illness.    ?All other systems reviewed and are negative. ? ? ?Prior CV studies:   ?The following studies were reviewed today: ? ?TAVR OPERATIVE NOTE ?  ?  ?Date of Procedure:                10/12/2020 ?  ?Preoperative Diagnosis:      Severe Aortic Stenosis  ?  ?Postoperative Diagnosis:    Same  ?  ?Procedure:      ?  ?Transcatheter Aortic Valve Replacement - Percutaneous Left Transfemoral Approach ?            Edwards Sapien 3 Ultra THV (size 23 mm, model # [id number], serial # [id number]) ?             ?Co-Surgeons:                        Gaye Pollack, MD and Lauree Chandler, MD ?  ?  ?Anesthesiologist:                  Renaldo Reel, MD ?  ?Echocardiographer:              Osborne Oman ?  ?Pre-operative Echo Findings: ?Severe aortic stenosis ?Normal left ventricular systolic function ?  ?Post-operative Echo Findings: ?No paravalvular leak ?Normal left  ventricular systolic function ?  ?_____________ ?  ?Echo 10/13/2020: ?IMPRESSIONS  ? 1. The aortic valve has been repaired/replaced. Aortic valve  ?regurgitation is not visualized. There is a 23 mm Edwards Sapien

## 2021-09-16 ENCOUNTER — Telehealth: Payer: Self-pay

## 2021-09-16 ENCOUNTER — Ambulatory Visit (INDEPENDENT_AMBULATORY_CARE_PROVIDER_SITE_OTHER): Payer: Medicare HMO | Admitting: Physician Assistant

## 2021-09-16 VITALS — Ht 61.0 in | Wt 122.0 lb

## 2021-09-16 DIAGNOSIS — I5033 Acute on chronic diastolic (congestive) heart failure: Secondary | ICD-10-CM

## 2021-09-16 DIAGNOSIS — I480 Paroxysmal atrial fibrillation: Secondary | ICD-10-CM | POA: Diagnosis not present

## 2021-09-16 DIAGNOSIS — Z950 Presence of cardiac pacemaker: Secondary | ICD-10-CM | POA: Diagnosis not present

## 2021-09-16 DIAGNOSIS — Z952 Presence of prosthetic heart valve: Secondary | ICD-10-CM

## 2021-09-16 MED ORDER — POTASSIUM CHLORIDE CRYS ER 20 MEQ PO TBCR: 20.0000 meq | EXTENDED_RELEASE_TABLET | Freq: Every day | ORAL | 3 refills | Status: DC

## 2021-09-16 MED ORDER — AMIODARONE HCL 200 MG PO TABS: ORAL_TABLET | ORAL | 3 refills | Status: AC

## 2021-09-16 MED ORDER — APIXABAN 5 MG PO TABS: 5.0000 mg | ORAL_TABLET | Freq: Two times a day (BID) | ORAL | 3 refills | Status: AC

## 2021-09-16 MED ORDER — FUROSEMIDE 20 MG PO TABS: 20.0000 mg | ORAL_TABLET | Freq: Every morning | ORAL | 3 refills | Status: DC

## 2021-09-16 NOTE — Telephone Encounter (Signed)
Pt previously saw Dr. Elease Hashimoto but would prefer to be seen at [place] to [place].Pt is scheduled to see Dr. Tomie China on Thursday April, 27. ?

## 2021-09-16 NOTE — Patient Instructions (Signed)
Medication Instructions:  ?Your physician recommends that you continue on your current medications as directed. Please refer to the Current Medication list given to you today.  ? ?*If you need a refill on your cardiac medications before your next appointment, please call your pharmacy* ? ? ?Lab Work: ?None ordered  ? ?If you have labs (blood work) drawn today and your tests are completely normal, you will receive your results only by: ?MyChart Message (if you have MyChart) OR ?A paper copy in the mail ?If you have any lab test that is abnormal or we need to change your treatment, we will call you to review the results. ? ? ?Testing/Procedures: ?Your physician has requested that you have an echocardiogram on Friday May 5. Echocardiography is a painless test that uses sound waves to create images of your heart. It provides your doctor with information about the size and shape of your heart and how well your heart?s chambers and valves are working. This procedure takes approximately one hour. There are no restrictions for this procedure. ? ? ? ?Follow-Up: ?You are scheduled to see Dr. Jyl Heinz on Thursday, April 27  ? ? ?Other Instructions ? ? ?Important Information About Sugar ? ? ? ? ?  ?

## 2021-09-21 DIAGNOSIS — Z950 Presence of cardiac pacemaker: Secondary | ICD-10-CM | POA: Insufficient documentation

## 2021-09-21 DIAGNOSIS — M1990 Unspecified osteoarthritis, unspecified site: Secondary | ICD-10-CM | POA: Insufficient documentation

## 2021-09-21 DIAGNOSIS — G8929 Other chronic pain: Secondary | ICD-10-CM | POA: Insufficient documentation

## 2021-09-22 ENCOUNTER — Encounter: Payer: Self-pay | Admitting: Cardiology

## 2021-09-22 ENCOUNTER — Ambulatory Visit (INDEPENDENT_AMBULATORY_CARE_PROVIDER_SITE_OTHER): Payer: Medicare HMO | Admitting: Cardiology

## 2021-09-22 VITALS — BP 154/76 | HR 68 | Ht 62.0 in | Wt 125.1 lb

## 2021-09-22 DIAGNOSIS — I10 Essential (primary) hypertension: Secondary | ICD-10-CM | POA: Diagnosis not present

## 2021-09-22 DIAGNOSIS — I480 Paroxysmal atrial fibrillation: Secondary | ICD-10-CM

## 2021-09-22 DIAGNOSIS — I4819 Other persistent atrial fibrillation: Secondary | ICD-10-CM | POA: Diagnosis not present

## 2021-09-22 DIAGNOSIS — I5032 Chronic diastolic (congestive) heart failure: Secondary | ICD-10-CM

## 2021-09-22 DIAGNOSIS — Z950 Presence of cardiac pacemaker: Secondary | ICD-10-CM

## 2021-09-22 DIAGNOSIS — Z952 Presence of prosthetic heart valve: Secondary | ICD-10-CM

## 2021-09-22 DIAGNOSIS — Z8673 Personal history of transient ischemic attack (TIA), and cerebral infarction without residual deficits: Secondary | ICD-10-CM

## 2021-09-22 DIAGNOSIS — I350 Nonrheumatic aortic (valve) stenosis: Secondary | ICD-10-CM

## 2021-09-22 NOTE — Progress Notes (Signed)
?Cardiology Office Note:   ? ?Date:  09/22/2021  ? ?ID:  Kaitlyn [name], DOB [birth year], MRN [id number] ? ?PCP:  Jacelyn Pi, Lilia Argue, MD  ?Cardiologist:  Jenean Lindau, MD  ? ?Referring MD: Jacelyn Pi, Irma M, *  ? ? ?ASSESSMENT:   ? ?1. Benign essential hypertension   ?2. Persistent atrial fibrillation ([place])   ?3. Paroxysmal atrial fibrillation ([place])   ?4. Chronic diastolic CHF (congestive heart failure) ([place])   ?5. Severe aortic stenosis   ?6. S/P TAVR (transcatheter aortic valve replacement)   ?7. S/P placement of cardiac pacemaker   ?8. History of stroke   ? ?PLAN:   ? ?In order of problems listed above: ? ?Primary prevention stressed to the patient.  Importance of compliance with diet medication stressed and she vocalized understanding. ?Essential hypertension: Her blood pressure stable at home.  She mentions to me that it is better.  Obviously this is the first time she is in the clinic and she tells me that she had some problems finding her place so she was a little stressed about it.  Her husband is sitting in the car in the parking lot.  I told her to keep a track of blood pressures at home and send [place] a copy. ?Paroxysmal atrial fibrillation:I discussed with the patient atrial fibrillation, disease process. Management and therapy including rate and rhythm control, anticoagulation benefits and potential risks were discussed extensively with the patient. Patient had multiple questions which were answered to patient's satisfaction.  She takes amiodarone 100 mg daily and will get blood work today including liver, TSH and lipid profile. ?Complete heart block post permanent pacemaker: Followed by our electrophysiology colleagues.  Those notes were reviewed. ?Post TAVR: Stable at this time echocardiogram was reviewed and discussed with her at length and questions were answered to her satisfaction. ?Patient will be seen in follow-up appointment in 6 months or earlier if the patient has any  concerns ? ? ? ?Medication Adjustments/Labs and Tests Ordered: ?Current medicines are reviewed at length with the patient today.  Concerns regarding medicines are outlined above.  ?Orders Placed This Encounter  ?Procedures  ? Basic metabolic panel  ? CBC with Differential/Platelet  ? Hepatic function panel  ? Lipid panel  ? TSH  ? EKG 12-Lead  ? ?No orders of the defined types were placed in this encounter. ? ? ? ?No chief complaint on file. ?  ? ?History of Present Illness:   ? ?Kaitlyn [name] is a 81 y.o. female.  Patient has past medical history of severe aortic stenosis status post TAVR,, permanent pacemaker, paroxysmal atrial fibrillation.  Patient has history of stroke.  Patient is on anticoagulation.  Reports mentioned that she has issues of dementia.  This is the first time I have seen her.  She has transitioned to my clinic from my partners.  At the time of my evaluation, the patient is alert awake oriented and in no distress.  She denies any chest pain orthopnea or PND. ? ?Past Medical History:  ?Diagnosis Date  ? Aftercare following left shoulder joint replacement surgery 01/20/2018  ? Age-related osteoporosis with current pathological fracture 11/13/2019  ? Anxiety 05/25/2014  ? Arthritis   ? both knees  ? Benign essential hypertension 12/14/2014  ? Bilateral primary osteoarthritis of knee 11/13/2019  ? Chronic pain   ? Class 1 obesity due to excess calories with serious comorbidity and body mass index (BMI) of 31.0 to 31.9 in adult 09/19/2013  ? Closed  3-part fracture of proximal end of left humerus 01/23/2017  ? Closed fracture of lower end of left radius with routine healing 08/02/2017  ? Congestive heart failure, NYHA class III, acute on chronic, diastolic ([place]) 0000000  ? CVA (cerebral vascular accident) ([place]) 06/15/2019  ? Encounter for long-term (current) use of aspirin 12/27/2019  ? GERD (gastroesophageal reflux disease) 06/15/2019  ? GI bleed 06/15/2019  ? Glucose intolerance (impaired glucose tolerance)  09/02/2014  ? History of cardioembolic cerebrovascular accident (CVA) 06/15/2019  ? History of fall 07/02/2019  ? History of GI bleed   ? History of ischemic stroke 10/15/2019  ? History of stroke 05/2019  ? Insomnia 02/18/2016  ? Iron deficiency anemia 06/15/2019  ? Long term prescription benzodiazepine use 11/13/2019  ? Osteopenia of multiple sites 11/25/2018  ? Pacemaker-MDT 01/19/2020  ? Persistent atrial fibrillation ([place])   ? Pneumonia due to infectious organism 02/01/2020  ? Prediabetes 11/13/2019  ? Presence of permanent cardiac pacemaker   ? placed for SSS- Medtronic device   ? Primary osteoarthritis of left knee 01/16/2019  ? Primary osteoarthritis of right knee 01/16/2019  ? 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 RLS (restless legs syndrome) 11/13/2019  ? S/P placement of cardiac pacemaker 02/01/2020  ? 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 s/p TAVR with a 70mm Edwards S3U via the TF approach by Dr. Angelena Form & Dr. Cyndia Bent   ? Severe aortic stenosis   ? 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 VHD (valvular heart disease) 06/16/2019  ? VSD (ventricular septal defect) 09/19/2013  ? ? ?Past Surgical History:  ?Procedure Laterality Date  ? CERVICAL SPINE SURGERY    ? EYE SURGERY Left   ? PACEMAKER IMPLANT    ? PPM GENERATOR CHANGEOUT N/A 10/17/2019  ? Procedure: PPM GENERATOR CHANGEOUT;  Surgeon: Deboraha Sprang, MD;  Location: [facility];  Service: Cardiovascular;  Laterality: N/A;  ? shoulder replacement Left   ? TEE WITHOUT CARDIOVERSION N/A 10/12/2020  ? Procedure: TRANSESOPHAGEAL ECHOCARDIOGRAM (TEE);  Surgeon: Burnell Blanks, MD;  Location: [facility];  Service: Open Heart Surgery;  Laterality: N/A;  ? TRANSCATHETER AORTIC VALVE REPLACEMENT, TRANSFEMORAL N/A 10/12/2020  ? Procedure: TRANSCATHETER AORTIC VALVE REPLACEMENT, TRANSFEMORAL;  Surgeon: Burnell Blanks, MD;  Location: [facility];  Service: Open Heart Surgery;  Laterality: N/A;   ? ? ?Current Medications: ?Current Meds  ?Medication Sig  ? albuterol (VENTOLIN HFA) 108 (90 Base) MCG/ACT inhaler Inhale 2 puffs into the lungs every 6 (six) hours as needed for wheezing or shortness of breath.  ? amiodarone (PACERONE) 200 MG tablet TAKE 1/2 TABLET DAILY (NONE ON WEEKENDS)  ? apixaban (ELIQUIS) 5 MG TABS tablet Take 1 tablet (5 mg total) by mouth 2 (two) times daily.  ? cholecalciferol (VITAMIN D3) 25 MCG (1000 UNIT) tablet Take 1,000 Units by mouth daily.  ? Coenzyme Q10 (CO Q-10) 200 MG CAPS Take 200 mg by mouth daily.  ? ferrous sulfate 325 (65 FE) MG tablet Take 325 mg by mouth daily with breakfast.  ? furosemide (LASIX) 20 MG tablet Take 1 tablet (20 mg total) by mouth every morning.  ? Magnesium 400 MG TABS Take 400 mg by mouth daily.  ? potassium chloride SA (KLOR-CON M) 20 MEQ tablet Take 1 tablet (20 mEq total) by mouth daily.  ? sertraline (ZOLOFT) 25 MG tablet Take 1 tablet by mouth daily.  ? Thiamine HCl (VITAMIN B-1 PO) Take 100 mg by mouth  daily.  ?  ? ?Allergies:   Patient has no active allergies.  ? ?Social History  ? ?Socioeconomic History  ? Marital status: Married  ?  Spouse name: Not on file  ? Number of children: 0  ? Years of education: Not on file  ? Highest education level: Not on file  ?Occupational History  ? Occupation: Optometrist  ?Tobacco Use  ? Smoking status: Every Day  ?  Types: E-cigarettes  ? Smokeless tobacco: Never  ? Tobacco comments:  ?  vaping all day  ?Vaping Use  ? Vaping Use: Every day  ? Start date: 10/27/2013  ?Substance and Sexual Activity  ? Alcohol use: Never  ? Drug use: Never  ? Sexual activity: Not on file  ?Other Topics Concern  ? Not on file  ?Social History Narrative  ? Not on file  ? ?Social Determinants of Health  ? ?Financial Resource Strain: Not on file  ?Food Insecurity: Not on file  ?Transportation Needs: Not on file  ?Physical Activity: Not on file  ?Stress: Not on file  ?Social Connections: Not on file  ?  ? ?Family History: ?The patient's  family history includes Alzheimer's disease in her mother; Stroke in her father. ? ?ROS:   ?Please see the history of present illness.    ?All other systems reviewed and are negative. ? ?EKGs/Labs/Other Studies Reviewed:

## 2021-09-22 NOTE — Patient Instructions (Signed)
Medication Instructions:  Your physician recommends that you continue on your current medications as directed. Please refer to the Current Medication list given to you today.  *If you need a refill on your cardiac medications before your next appointment, please call your pharmacy*   Lab Work: Your physician recommends that you have labs done in the office today. Your test included  basic metabolic panel, complete blood count, TSH, liver function and lipids.  If you have labs (blood work) drawn today and your tests are completely normal, you will receive your results only by: MyChart Message (if you have MyChart) OR A paper copy in the mail If you have any lab test that is abnormal or we need to change your treatment, we will call you to review the results.   Testing/Procedures: None ordered   Follow-Up: At [facility], you and your health needs are our priority.  As part of our continuing mission to provide you with exceptional heart care, we have created designated Provider Care Teams.  These Care Teams include your primary Cardiologist (physician) and Advanced Practice Providers (APPs -  Physician Assistants and Nurse Practitioners) who all work together to provide you with the care you need, when you need it.  We recommend signing up for the patient portal called "MyChart".  Sign up information is provided on this After Visit Summary.  MyChart is used to connect with patients for Virtual Visits (Telemedicine).  Patients are able to view lab/test results, encounter notes, upcoming appointments, etc.  Non-urgent messages can be sent to your provider as well.   To learn more about what you can do with MyChart, go to [email or web address].    Your next appointment:   9 month(s)  The format for your next appointment:   In Person  Provider:   Rajan Revankar, MD   Other Instructions NA   

## 2021-09-23 LAB — HEPATIC FUNCTION PANEL
ALT: 15 IU/L (ref 0–32)
AST: 24 IU/L (ref 0–40)
Albumin: 4.4 g/dL (ref 3.7–4.7)
Alkaline Phosphatase: 120 IU/L (ref 44–121)
Bilirubin Total: 0.9 mg/dL (ref 0.0–1.2)
Bilirubin, Direct: 0.21 mg/dL (ref 0.00–0.40)
Total Protein: 7.2 g/dL (ref 6.0–8.5)

## 2021-09-23 LAB — BASIC METABOLIC PANEL
BUN/Creatinine Ratio: 15 (ref 12–28)
BUN: 17 mg/dL (ref 8–27)
CO2: 25 mmol/L (ref 20–29)
Calcium: 10.3 mg/dL (ref 8.7–10.3)
Chloride: 100 mmol/L (ref 96–106)
Creatinine, Ser: 1.1 mg/dL — ABNORMAL HIGH (ref 0.57–1.00)
Glucose: 108 mg/dL — ABNORMAL HIGH (ref 70–99)
Potassium: 4.9 mmol/L (ref 3.5–5.2)
Sodium: 142 mmol/L (ref 134–144)
eGFR: 51 mL/min/{1.73_m2} — ABNORMAL LOW (ref >59)

## 2021-09-23 LAB — CBC WITH DIFFERENTIAL/PLATELET
Basophils Absolute: 0.1 10*3/uL (ref 0.0–0.2)
Basos: 1 %
EOS (ABSOLUTE): 0.1 10*3/uL (ref 0.0–0.4)
Eos: 2 %
Hematocrit: 47.6 % — ABNORMAL HIGH (ref 34.0–46.6)
Hemoglobin: 15.7 g/dL (ref 11.1–15.9)
Immature Grans (Abs): 0 10*3/uL (ref 0.0–0.1)
Immature Granulocytes: 0 %
Lymphocytes Absolute: 1.1 10*3/uL (ref 0.7–3.1)
Lymphs: 19 %
MCH: 30.6 pg (ref 26.6–33.0)
MCHC: 33 g/dL (ref 31.5–35.7)
MCV: 93 fL (ref 79–97)
Monocytes Absolute: 0.3 10*3/uL (ref 0.1–0.9)
Monocytes: 6 %
Neutrophils Absolute: 4.2 10*3/uL (ref 1.4–7.0)
Neutrophils: 72 %
Platelets: 191 10*3/uL (ref 150–450)
RBC: 5.13 x10E6/uL (ref 3.77–5.28)
RDW: 12.3 % (ref 11.7–15.4)
WBC: 5.8 10*3/uL (ref 3.4–10.8)

## 2021-09-23 LAB — LIPID PANEL
Chol/HDL Ratio: 2.7 ratio (ref 0.0–4.4)
Cholesterol, Total: 197 mg/dL (ref 100–199)
HDL: 73 mg/dL (ref >39)
LDL Chol Calc (NIH): 112 mg/dL — ABNORMAL HIGH (ref 0–99)
Triglycerides: 68 mg/dL (ref 0–149)
VLDL Cholesterol Cal: 12 mg/dL (ref 5–40)

## 2021-09-23 LAB — TSH: TSH: 2.13 u[IU]/mL (ref 0.450–4.500)

## 2021-09-30 ENCOUNTER — Ambulatory Visit (HOSPITAL_BASED_OUTPATIENT_CLINIC_OR_DEPARTMENT_OTHER): Admission: RE | Admit: 2021-09-30 | Payer: Medicare HMO | Source: Ambulatory Visit

## 2021-10-14 ENCOUNTER — Ambulatory Visit (INDEPENDENT_AMBULATORY_CARE_PROVIDER_SITE_OTHER): Payer: Medicare HMO

## 2021-10-14 DIAGNOSIS — I495 Sick sinus syndrome: Secondary | ICD-10-CM

## 2021-10-18 LAB — CUP PACEART REMOTE DEVICE CHECK
Battery Remaining Longevity: 143 mo
Battery Voltage: 3.03 V
Brady Statistic AP VP Percent: 0.04 %
Brady Statistic AP VS Percent: 94.16 %
Brady Statistic AS VP Percent: 0 %
Brady Statistic AS VS Percent: 5.81 %
Brady Statistic RA Percent Paced: 94.22 %
Brady Statistic RV Percent Paced: 0.04 %
Date Time Interrogation Session: 20230518215102
Implantable Lead Implant Date: 20091201
Implantable Lead Implant Date: 20091209
Implantable Lead Location: 753859
Implantable Lead Location: 753860
Implantable Lead Model: 4076
Implantable Lead Model: 4076
Implantable Pulse Generator Implant Date: 20210521
Lead Channel Impedance Value: 361 Ohm
Lead Channel Impedance Value: 418 Ohm
Lead Channel Impedance Value: 817 Ohm
Lead Channel Impedance Value: 855 Ohm
Lead Channel Pacing Threshold Amplitude: 0.5 V
Lead Channel Pacing Threshold Amplitude: 0.5 V
Lead Channel Pacing Threshold Pulse Width: 0.4 ms
Lead Channel Pacing Threshold Pulse Width: 0.4 ms
Lead Channel Sensing Intrinsic Amplitude: 13.375 mV
Lead Channel Sensing Intrinsic Amplitude: 13.375 mV
Lead Channel Sensing Intrinsic Amplitude: 2.875 mV
Lead Channel Sensing Intrinsic Amplitude: 2.875 mV
Lead Channel Setting Pacing Amplitude: 1.5 V
Lead Channel Setting Pacing Amplitude: 2.5 V
Lead Channel Setting Pacing Pulse Width: 0.4 ms
Lead Channel Setting Sensing Sensitivity: 5.6 mV

## 2021-10-20 NOTE — Progress Notes (Signed)
Remote pacemaker transmission.   

## 2021-11-07 ENCOUNTER — Ambulatory Visit (HOSPITAL_COMMUNITY): Payer: Medicare HMO | Attending: Cardiovascular Disease

## 2021-11-07 DIAGNOSIS — Z952 Presence of prosthetic heart valve: Secondary | ICD-10-CM | POA: Diagnosis present

## 2021-11-07 LAB — ECHOCARDIOGRAM COMPLETE
AR max vel: 0.96 cm2
AV Area VTI: 0.96 cm2
AV Area mean vel: 1.01 cm2
AV Mean grad: 25 mmHg
AV Peak grad: 47.9 mmHg
Ao pk vel: 3.46 m/s
Area-P 1/2: 3.3 cm2
MV M vel: 5.93 m/s
MV Peak grad: 140.7 mmHg
P 1/2 time: 595 msec
Radius: 0.6 cm
S' Lateral: 1.4 cm

## 2021-11-08 ENCOUNTER — Other Ambulatory Visit: Payer: Self-pay | Admitting: Physician Assistant

## 2021-11-08 ENCOUNTER — Other Ambulatory Visit: Payer: Self-pay

## 2021-11-08 ENCOUNTER — Encounter: Payer: Self-pay | Admitting: Physician Assistant

## 2021-11-08 DIAGNOSIS — I350 Nonrheumatic aortic (valve) stenosis: Secondary | ICD-10-CM

## 2021-11-08 DIAGNOSIS — Z952 Presence of prosthetic heart valve: Secondary | ICD-10-CM

## 2021-11-21 ENCOUNTER — Ambulatory Visit (HOSPITAL_COMMUNITY)
Admission: RE | Admit: 2021-11-21 | Discharge: 2021-11-21 | Disposition: A | Discharge status: Home / Self Care | Payer: Medicare HMO | Source: Ambulatory Visit | Attending: Physician Assistant | Admitting: Physician Assistant

## 2021-11-21 DIAGNOSIS — I350 Nonrheumatic aortic (valve) stenosis: Secondary | ICD-10-CM

## 2021-11-21 DIAGNOSIS — Z952 Presence of prosthetic heart valve: Secondary | ICD-10-CM

## 2021-11-21 MED ORDER — IOHEXOL 350 MG/ML SOLN
75.0000 mL | Freq: Once | INTRAVENOUS | Status: AC | PRN
  Administered 2021-11-21: 75 mL via INTRAVENOUS

## 2021-11-22 ENCOUNTER — Other Ambulatory Visit: Payer: Self-pay | Admitting: Physician Assistant

## 2021-11-22 DIAGNOSIS — Z952 Presence of prosthetic heart valve: Secondary | ICD-10-CM

## 2022-01-13 ENCOUNTER — Ambulatory Visit (INDEPENDENT_AMBULATORY_CARE_PROVIDER_SITE_OTHER): Payer: Medicare HMO

## 2022-01-13 DIAGNOSIS — I495 Sick sinus syndrome: Secondary | ICD-10-CM

## 2022-01-16 LAB — CUP PACEART REMOTE DEVICE CHECK
Battery Remaining Longevity: 138 mo
Battery Voltage: 3.03 V
Brady Statistic RA Percent Paced: 0.01 %
Brady Statistic RV Percent Paced: 31.36 %
Date Time Interrogation Session: 20230818012039
Implantable Lead Implant Date: 20091201
Implantable Lead Implant Date: 20091209
Implantable Lead Location: 753859
Implantable Lead Location: 753860
Implantable Lead Model: 4076
Implantable Lead Model: 4076
Implantable Pulse Generator Implant Date: 20210521
Lead Channel Impedance Value: 304 Ohm
Lead Channel Impedance Value: 361 Ohm
Lead Channel Impedance Value: 627 Ohm
Lead Channel Impedance Value: 684 Ohm
Lead Channel Pacing Threshold Amplitude: 0.5 V
Lead Channel Pacing Threshold Amplitude: 0.5 V
Lead Channel Pacing Threshold Pulse Width: 0.4 ms
Lead Channel Pacing Threshold Pulse Width: 0.4 ms
Lead Channel Sensing Intrinsic Amplitude: 15.5 mV
Lead Channel Sensing Intrinsic Amplitude: 15.5 mV
Lead Channel Sensing Intrinsic Amplitude: 2.125 mV
Lead Channel Sensing Intrinsic Amplitude: 2.125 mV
Lead Channel Setting Pacing Amplitude: 1.5 V
Lead Channel Setting Pacing Amplitude: 2.5 V
Lead Channel Setting Pacing Pulse Width: 0.4 ms
Lead Channel Setting Sensing Sensitivity: 5.6 mV

## 2022-02-08 NOTE — Progress Notes (Signed)
Remote pacemaker transmission.   

## 2022-02-09 ENCOUNTER — Encounter: Payer: Self-pay | Admitting: Internal Medicine

## 2022-02-13 ENCOUNTER — Ambulatory Visit (HOSPITAL_COMMUNITY): Payer: Medicare HMO | Attending: Cardiovascular Disease

## 2022-02-13 ENCOUNTER — Encounter: Payer: Self-pay | Admitting: Cardiology

## 2022-02-13 ENCOUNTER — Encounter (HOSPITAL_COMMUNITY): Payer: Self-pay | Admitting: Physician Assistant

## 2022-02-13 NOTE — Progress Notes (Unsigned)
Patient ID: Kaitlyn [name], female   DOB: January 04, 1941, 81 y.o.   MRN: [id number]  Verified appointment "no show" status with 11:48am at [name].

## 2022-04-08 IMAGING — US US THYROID
1 series · 13 of 25 positions shown · non-contrast
Comparison: None.

CLINICAL DATA: Nodule on carotid study

EXAM:
THYROID ULTRASOUND
TECHNIQUE: Ultrasound examination of the thyroid gland and adjacent soft
tissues was performed.

[Series 1: us thyroid · 0.08mm/px · 13 of 49 slices shown]
[im 1/49]
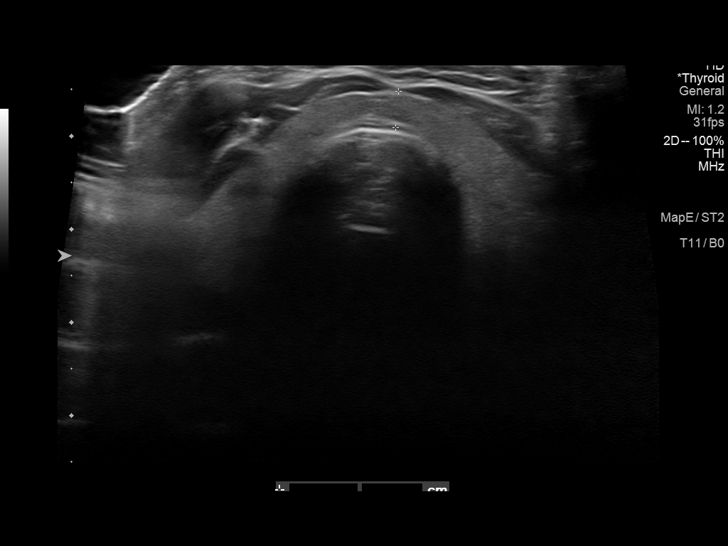
[im 5/49]
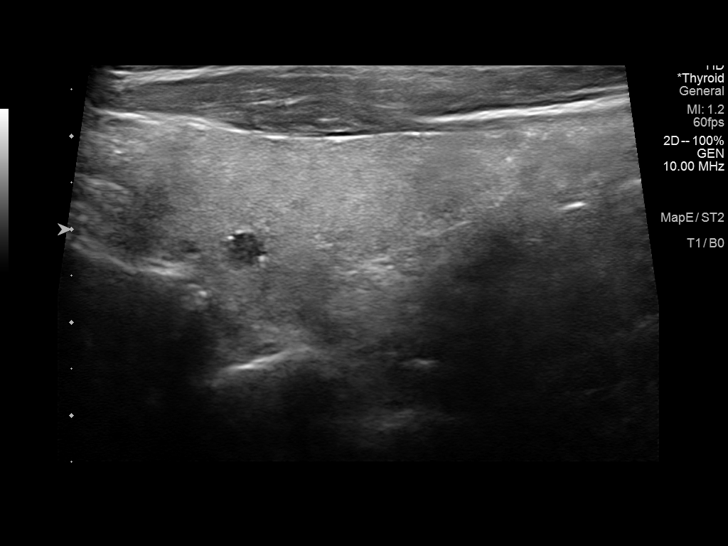
[im 9/49]
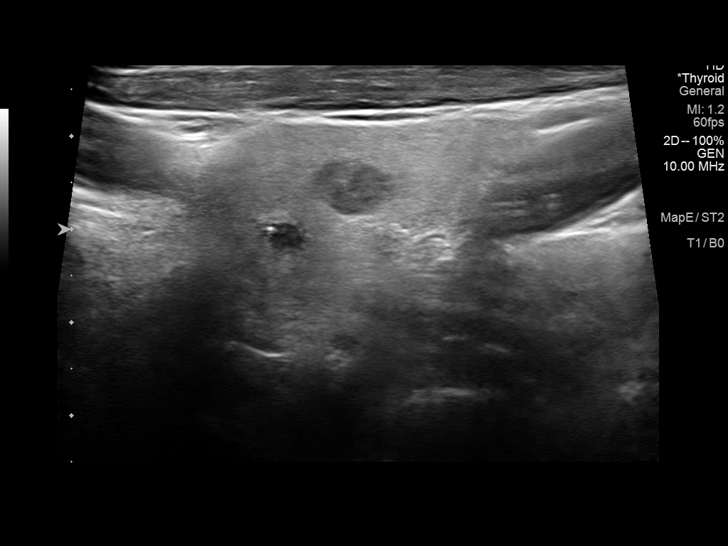
[im 13/49]
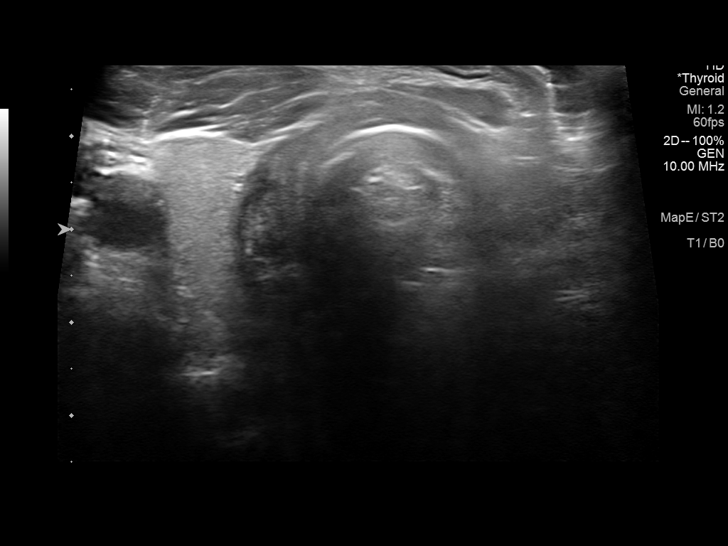
[im 17/49]
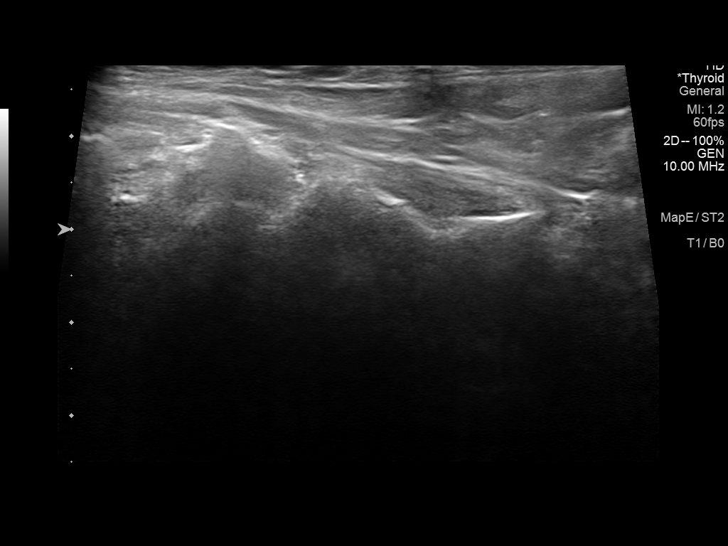
[im 21/49]
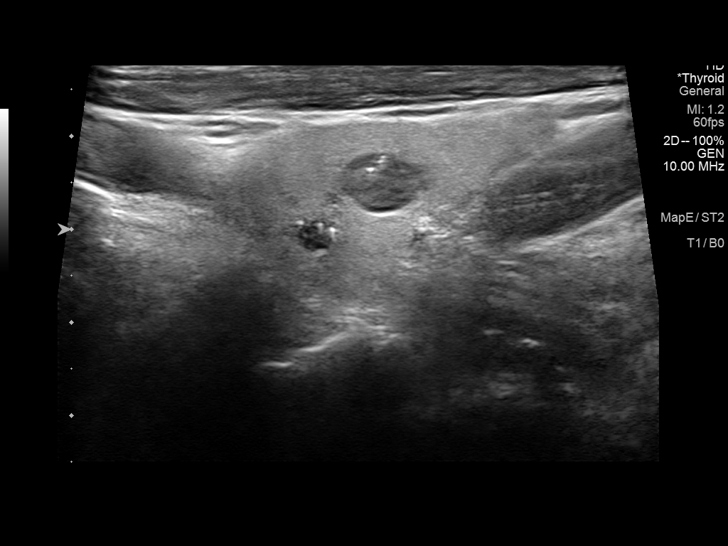
[im 25/49]
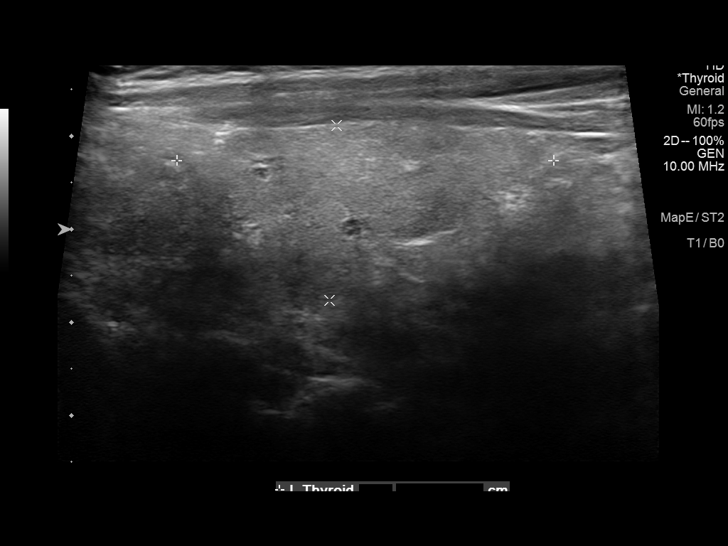
[im 29/49]
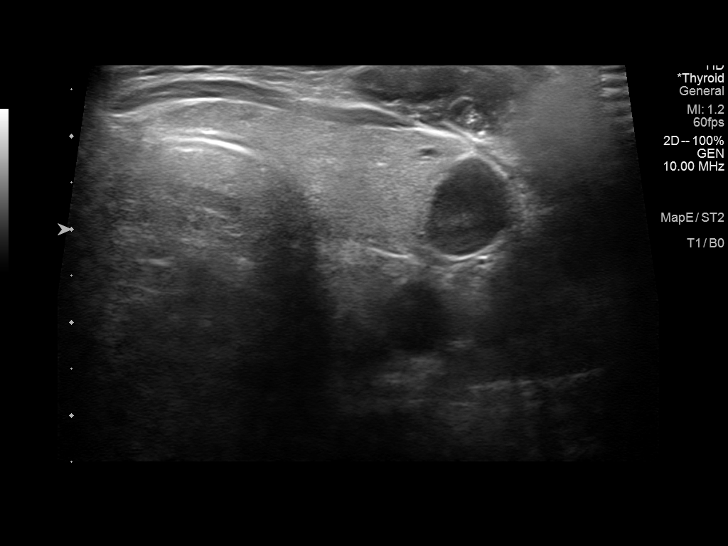
[im 33/49]
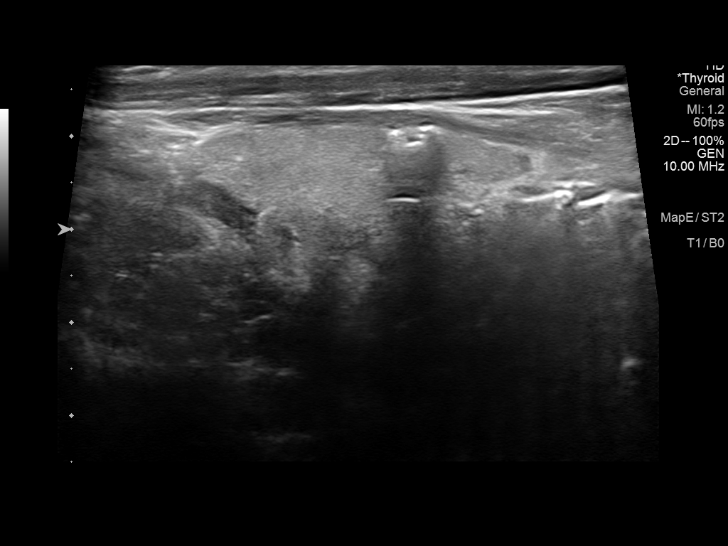
[im 37/49]
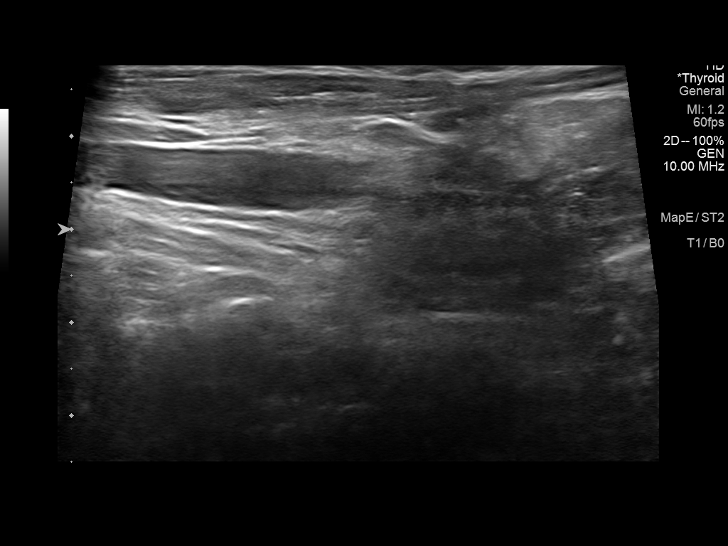
[im 41/49]
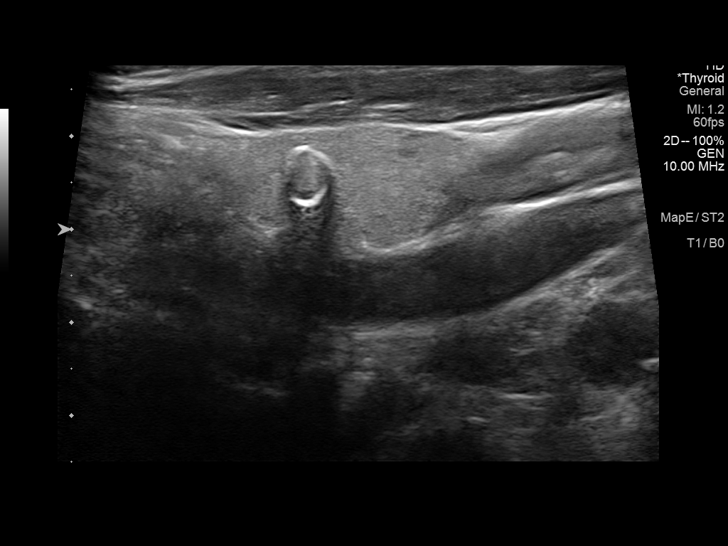
[im 45/49]
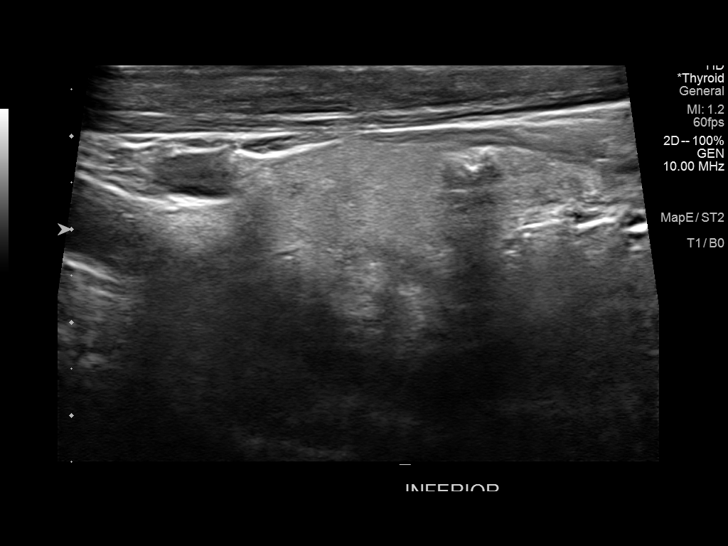
[im 49/49]
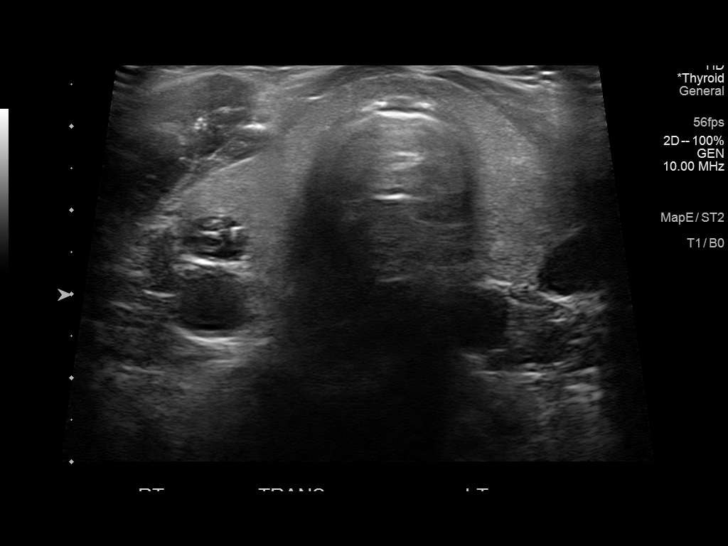

[13 of 25 positions shown; findings below may reference images not displayed]

FINDINGS: Parenchymal Echotexture: Mildly heterogenous

Isthmus: 0.4 cm thickness

Right lobe: 4.1 x 2 x 1.6 cm

Left lobe: 4 x 1.9 x 1.9 cm

_________________________________________________________

Estimated total number of nodules >/= 1 cm: 0

Number of spongiform nodules >/=  2 cm not described below (TR1): 0

Number of mixed cystic and solid nodules >/= 1.5 cm not described
below (TR2): 0

_________________________________________________________

Nodule # 1:

Location: Right; mid

Maximum size: 0.9 cm; Other 2 dimensions: 0.8 x 0.5 cm

Composition: solid/almost completely solid (2)

Echogenicity: hypoechoic (2)

Shape: not taller-than-wide (0)

Margins: smooth (0)

Echogenic foci: macrocalcifications (1)

ACR TI-RADS total points: 5.

ACR TI-RADS risk category: TR4 (4-6 points).

ACR TI-RADS recommendations:

Given size (<0.9 cm) and appearance, this nodule does NOT meet
TI-RADS criteria for biopsy or dedicated follow-up.

_________________________________________________________

Nodule # 2: 0.6 cm peripherally calcified nodule, mid left

Nodule # 3: 0.7 cm coarsely calcified nodule, inferior left
IMPRESSION: Normal-sized thyroid with subcentimeter calcified nodules. None
meets criteria for biopsy or follow-up.

The above is in keeping with the ACR TI-RADS recommendations - [HOSPITAL] 9401;[DATE].

## 2022-04-14 ENCOUNTER — Ambulatory Visit (INDEPENDENT_AMBULATORY_CARE_PROVIDER_SITE_OTHER): Payer: Medicare HMO

## 2022-04-14 DIAGNOSIS — I495 Sick sinus syndrome: Secondary | ICD-10-CM

## 2022-04-14 LAB — CUP PACEART REMOTE DEVICE CHECK
Battery Remaining Longevity: 137 mo
Battery Voltage: 3.03 V
Brady Statistic RA Percent Paced: 0.08 %
Brady Statistic RV Percent Paced: 8.55 %
Date Time Interrogation Session: 20231116192948
Implantable Lead Connection Status: 753985
Implantable Lead Connection Status: 753985
Implantable Lead Implant Date: 20091201
Implantable Lead Implant Date: 20091209
Implantable Lead Location: 753859
Implantable Lead Location: 753860
Implantable Lead Model: 4076
Implantable Lead Model: 4076
Implantable Pulse Generator Implant Date: 20210521
Lead Channel Impedance Value: 304 Ohm
Lead Channel Impedance Value: 361 Ohm
Lead Channel Impedance Value: 456 Ohm
Lead Channel Impedance Value: 551 Ohm
Lead Channel Pacing Threshold Amplitude: 0.5 V
Lead Channel Pacing Threshold Amplitude: 0.625 V
Lead Channel Pacing Threshold Pulse Width: 0.4 ms
Lead Channel Pacing Threshold Pulse Width: 0.4 ms
Lead Channel Sensing Intrinsic Amplitude: 1.125 mV
Lead Channel Sensing Intrinsic Amplitude: 1.125 mV
Lead Channel Sensing Intrinsic Amplitude: 15.25 mV
Lead Channel Sensing Intrinsic Amplitude: 15.25 mV
Lead Channel Setting Pacing Amplitude: 1.5 V
Lead Channel Setting Pacing Amplitude: 2.5 V
Lead Channel Setting Pacing Pulse Width: 0.4 ms
Lead Channel Setting Sensing Sensitivity: 5.6 mV
Zone Setting Status: 755011
Zone Setting Status: 755011

## 2022-04-26 ENCOUNTER — Other Ambulatory Visit: Payer: Self-pay | Admitting: Cardiovascular Disease

## 2022-04-26 DIAGNOSIS — Z952 Presence of prosthetic heart valve: Secondary | ICD-10-CM

## 2022-05-02 NOTE — Progress Notes (Signed)
Remote pacemaker transmission.   

## 2022-05-08 ENCOUNTER — Telehealth: Payer: Self-pay

## 2022-05-08 NOTE — Telephone Encounter (Signed)
-----   Message from Duke Salvia, MD sent at 05/08/2022 12:08 PM EST ----- Remote reviewed. This remote is abnormal for    attery status is good.  Lead measurements unchanged.  Histograms are appropriate.   Persistent atrial fib for the last three months   can we ask her about going to the afib clinic and think about DCCV  Thanks SK

## 2022-05-08 NOTE — Telephone Encounter (Signed)
Attempted to contact a patient at the request of Dr. Graciela Husbands. No answer, LMTCB.

## 2022-05-09 NOTE — Telephone Encounter (Signed)
Patient called advised AF was noted on remote transmission and discuss DCCV. Patient agreeable.

## 2022-05-26 ENCOUNTER — Encounter (HOSPITAL_COMMUNITY): Payer: Self-pay | Admitting: Physician Assistant

## 2022-05-26 ENCOUNTER — Other Ambulatory Visit (HOSPITAL_COMMUNITY): Payer: Self-pay | Admitting: Physician Assistant

## 2022-05-26 ENCOUNTER — Ambulatory Visit (HOSPITAL_COMMUNITY)
Admission: RE | Admit: 2022-05-26 | Discharge: 2022-05-26 | Disposition: A | Discharge status: Home / Self Care | Payer: Medicare HMO | Source: Ambulatory Visit | Attending: Physician Assistant | Admitting: Physician Assistant

## 2022-05-26 VITALS — BP 128/90 | HR 114 | Ht 62.0 in | Wt 117.6 lb

## 2022-05-26 DIAGNOSIS — Z7901 Long term (current) use of anticoagulants: Secondary | ICD-10-CM | POA: Insufficient documentation

## 2022-05-26 DIAGNOSIS — R55 Syncope and collapse: Secondary | ICD-10-CM | POA: Insufficient documentation

## 2022-05-26 DIAGNOSIS — Z8673 Personal history of transient ischemic attack (TIA), and cerebral infarction without residual deficits: Secondary | ICD-10-CM | POA: Diagnosis not present

## 2022-05-26 DIAGNOSIS — Z86718 Personal history of other venous thrombosis and embolism: Secondary | ICD-10-CM | POA: Insufficient documentation

## 2022-05-26 DIAGNOSIS — I4819 Other persistent atrial fibrillation: Secondary | ICD-10-CM

## 2022-05-26 DIAGNOSIS — I495 Sick sinus syndrome: Secondary | ICD-10-CM | POA: Insufficient documentation

## 2022-05-26 DIAGNOSIS — I350 Nonrheumatic aortic (valve) stenosis: Secondary | ICD-10-CM | POA: Insufficient documentation

## 2022-05-26 DIAGNOSIS — I10 Essential (primary) hypertension: Secondary | ICD-10-CM | POA: Insufficient documentation

## 2022-05-26 DIAGNOSIS — D6869 Other thrombophilia: Secondary | ICD-10-CM

## 2022-05-26 DIAGNOSIS — Z79899 Other long term (current) drug therapy: Secondary | ICD-10-CM | POA: Diagnosis not present

## 2022-05-26 NOTE — Patient Instructions (Signed)
Cardioversion scheduled for:   - Arrive at the [facility] and go to admitting on Monday January 22 at 10am   - Do not eat or drink anything after midnight the night prior to your procedure.   - Take all your morning medication (except diabetic medications) with a sip of water prior to arrival.  - You will not be able to drive home after your procedure.    - Do NOT miss any doses of your blood thinner - if you should miss a dose please notify our office immediately.   - If you feel as if you go back into normal rhythm prior to scheduled cardioversion, please notify our office immediately.  If your procedure is canceled in the cardioversion suite you will be charged a cancellation fee.   If you are on weekly OZEMPIC, TRULICITY, MOUNJARO, WEGOVY, OR BYDUREON  Hold medication 7 days prior to scheduled procedure/anesthesia.  Restart medication on the normal dosing day after scheduled procedure/anesthesia  If you are on daily BYETTA, VICTOZA, ADLYXIN, OR RYBELSUS:   Hold medication 24 hours prior to scheduled procedure/anesthesia.   Restart medication on the following day after scheduled procedure/anesthesia   For those patients who have a scheduled procedure/anesthesia on the same day of the week as their dose, hold the medication on the day of surgery.  They can take their scheduled dose the week before.  **Patients on the above medications scheduled for elective procedures that have not held the medication for the appropriate amount of time are at risk of cancellation or change in the anesthetic plan.

## 2022-05-26 NOTE — H&P (View-Only) (Signed)
  Primary Care Physician: Santiago Lago, Irma M, MD Primary Cardiologist: Dr Nahser Primary Electrophysiologist: Dr Klein Referring Physician: Dr Klein   Kaitlyn [name] is a 81 y.o. female with a history of CVA (while off a/c 2/2 GIB), AFib, remote DVT, HTN, SSSx w/PPM, AS who presents for follow up in the [facility].  Patient was admitted to [facility] 10/15/19 with increasing DOE, SOB, edema, noted with CHF. During her hospitalization, it was noted that her PPM has reached ERI and she underwent a gen change at that time. Patient does have a history of stroke while off anticoagulation. 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 Patient is on Eliquis for a CHADS2VASC score of 7. The device clinic received an alert for persistent afib on 04/15/20 with RVR. Patient reports she was unaware of her afib. There were no triggers that she could identify. She is s/p TAVR 10/12/20.   On follow up today, the device clinic noted that she has been in persistent afib for about 4 months. Patient is unaware of her arrhythmia. She did have a "passing out" spell witnessed by her husband about two weeks ago. Patient was straining on the toilet with a bowel movement when she lost consciousness. Her husband laid her on the bed and she quickly returned to her baseline. She did not go to the ED for evaluation.   Today, she denies symptoms of palpitations, chest pain, shortness of breath, orthopnea, PND, lower extremity edema, dizziness, presyncope, snoring, daytime somnolence, bleeding. The patient is tolerating medications without difficulties and is otherwise without complaint today.    Atrial Fibrillation Risk Factors:  she does not have symptoms or diagnosis of sleep apnea. she does not have a history of rheumatic fever.   she has a BMI of Body mass index is 21.51 kg/m.. Filed Weights   05/26/22 1108  Weight: 53.3 kg    Family  History  Problem Relation Age of Onset   Stroke Father    Alzheimer's disease Mother      Atrial Fibrillation Management history:  Previous antiarrhythmic drugs: propafenone, amiodarone  Previous cardioversions: none Previous ablations: none CHADS2VASC score: 7 Anticoagulation history: Eliquis   Past Medical History:  Diagnosis Date   Aftercare following left shoulder joint replacement surgery 01/20/2018   Age-related osteoporosis with current pathological fracture 11/13/2019   Anxiety 05/25/2014   Arthritis    both knees   Benign essential hypertension 12/14/2014   Bilateral primary osteoarthritis of knee 11/13/2019   Chronic pain    Class 1 obesity due to excess calories with serious comorbidity and body mass index (BMI) of 31.0 to 31.9 in adult 09/19/2013   Closed 3-part fracture of proximal end of left humerus 01/23/2017   Closed fracture of lower end of left radius with routine healing 08/02/2017   Congestive heart failure, NYHA class III, acute on chronic, diastolic (HCC) 02/01/2020   CVA (cerebral vascular accident) (HCC) 06/15/2019   Encounter for long-term (current) use of aspirin 12/27/2019   GERD (gastroesophageal reflux disease) 06/15/2019   GI bleed 06/15/2019   Glucose intolerance (impaired glucose tolerance) 09/02/2014   History of cardioembolic cerebrovascular accident (CVA) 06/15/2019   History of fall 07/02/2019   History of GI bleed    History of ischemic stroke 10/15/2019   History of stroke 05/2019   Insomnia 02/18/2016   Iron deficiency anemia 06/15/2019   Long term prescription benzodiazepine use 11/13/2019   Osteopenia of multiple sites   11/25/2018   Pacemaker-MDT 01/19/2020   Persistent atrial fibrillation (HCC)    Pneumonia due to infectious organism 02/01/2020   Prediabetes 11/13/2019   Presence of permanent cardiac pacemaker    placed for SSS- Medtronic device    Primary osteoarthritis of left knee 01/16/2019   Primary osteoarthritis of right knee 01/16/2019    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 McAlhany & Dr. 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 07/02/2019   VHD (valvular heart disease) 06/16/2019   VSD (ventricular septal defect) 09/19/2013   Past Surgical History:  Procedure Laterality Date   CERVICAL SPINE SURGERY     EYE SURGERY Left    PACEMAKER IMPLANT     PPM GENERATOR CHANGEOUT N/A 10/17/2019   Procedure: PPM GENERATOR CHANGEOUT;  Surgeon: Klein, Steven C, MD;  Location: [facility];  Service: Cardiovascular;  Laterality: N/A;   shoulder replacement Left    TEE WITHOUT CARDIOVERSION N/A 10/12/2020   Procedure: TRANSESOPHAGEAL ECHOCARDIOGRAM (TEE);  Surgeon: McAlhany, Christopher D, MD;  Location: [facility];  Service: Open Heart Surgery;  Laterality: N/A;   TRANSCATHETER AORTIC VALVE REPLACEMENT, TRANSFEMORAL N/A 10/12/2020   Procedure: TRANSCATHETER AORTIC VALVE REPLACEMENT, TRANSFEMORAL;  Surgeon: McAlhany, Christopher D, MD;  Location: [facility];  Service: Open Heart Surgery;  Laterality: N/A;    Current Outpatient Medications  Medication Sig Dispense Refill   albuterol (VENTOLIN HFA) 108 (90 Base) MCG/ACT inhaler Inhale 2 puffs into the lungs every 6 (six) hours as needed for wheezing or shortness of breath. 8 g 2   amiodarone (PACERONE) 200 MG tablet TAKE 1/2 TABLET DAILY (NONE ON WEEKENDS) 90 tablet 3   apixaban (ELIQUIS) 5 MG TABS tablet Take 1 tablet (5 mg total) by mouth 2 (two) times daily. 180 tablet 3   furosemide (LASIX) 20 MG tablet TAKE 1 TABLET EVERY MORNING 90 tablet 1   sertraline (ZOLOFT) 25 MG tablet Take 1 tablet by mouth daily.     No current facility-administered medications for this encounter.    No Known Allergies   Social  History   Socioeconomic History   Marital status: Married    Spouse name: Not on file   Number of children: 0   Years of education: Not on file   Highest education level: Not on file  Occupational History   Occupation: Accountant  Tobacco Use   Smoking status: Every Day    Types: E-cigarettes   Smokeless tobacco: Never   Tobacco comments:    vaping all day  Vaping Use   Vaping Use: Every day   Start date: 10/27/2013  Substance and Sexual Activity   Alcohol use: Never   Drug use: Never   Sexual activity: Not on file  Other Topics Concern   Not on file  Social History Narrative   Not on file   Social Determinants of Health   Financial Resource Strain: Not on file  Food Insecurity: Not on file  Transportation Needs: Not on file  Physical Activity: Not on file  Stress: Not on file  Social Connections: Not on file  Intimate Partner Violence: Not on file     ROS- All systems are reviewed and negative except as per the HPI above.    Physical Exam: Vitals:   05/26/22 1108  BP: (!) 128/90  Pulse: (!) 114  Weight: 53.3 kg  Height: 5\' 2"  (1.575 m)    GEN- The patient is a well appearing elderly female, alert and oriented x 3 today.   HEENT-head normocephalic, atraumatic, sclera clear, conjunctiva pink, hearing intact, trachea midline. Lungs- Clear to ausculation bilaterally, normal work of breathing Heart- irregular rate and rhythm, no murmurs, rubs or gallops  GI- soft, NT, ND, + BS Extremities- no clubbing, cyanosis, or edema MS- no significant deformity or atrophy Skin- no rash or lesion Psych- euthymic mood, full affect Neuro- strength and sensation are intact   Wt Readings from Last 3 Encounters:  05/26/22 53.3 kg  09/22/21 56.7 kg  09/16/21 55.3 kg    EKG today demonstrates  Afib, LAFB Vent. rate 114 BPM PR interval * ms QRS duration 110 ms QT/QTcB 354/487 ms  Echo 11/07/21 demonstrated   1. 23 mm S3. Vmax 3.4 m/s, MG 25 mmHG, EOA 0.96 cm2, DI 0.23.  Findings suggestive significant stenosis of the prosthetic valve. There is also  regurgitation which appears to be central. Would recommend gated cardiac  CTA for further evaluation. The aortic valve has been repaired/replaced. Aortic valve regurgitation is mild. There is a 23 mm valve present in the aortic position. Procedure Date: 10/12/2020.   2. Left ventricular ejection fraction, by estimation, is 70 to 75%. The  left ventricle has hyperdynamic function. The left ventricle has no  regional wall motion abnormalities. There is severe concentric left  ventricular hypertrophy. Left ventricular diastolic parameters are consistent with Grade II diastolic dysfunction (pseudonormalization).   3. Right ventricular systolic function is normal. The right ventricular  size is normal. There is normal pulmonary artery systolic pressure. The  estimated right ventricular systolic pressure is 25.3 mmHg.   4. Left atrial size was severely dilated.   5. The mitral valve is degenerative. Moderate mitral valve regurgitation.  No evidence of mitral stenosis.   6. There is mild dilatation of the ascending aorta, measuring 41 mm.   7. The inferior vena cava is normal in size with greater than 50%  respiratory variability, suggesting right atrial pressure of 3 mmHg.   Comparison(s): Changes from prior study are noted. Prosthetic valve  stenosis/regurgitation is now present.   Epic records are reviewed at length today  CHA2DS2-VASc Score = 6  The patient's score is based upon: CHF History: 0 HTN History: 1 Diabetes History: 0 Stroke History: 2 Vascular Disease History: 0 Age Score: 2 Gender Score: 1        ASSESSMENT AND PLAN: 1. Persistent Atrial Fibrillation (ICD10:  I48.19) The patient's CHA2DS2-VASc score is 6, indicating a 9.7% annual risk of stroke.   Patient has been in persistent afib since August.  We discussed rhythm control options. Will arrange for DCCV. Her husband is unsure if she has  missed any doses of anticoagulation in the past 3 weeks. They are sure she took it this AM. Will schedule DCCV 3 weeks from today.  Continue amiodarone 100 mg daily (skip Sat/Sun) Continue Eliquis 5 mg BID, stressed importance of taking this.  Continue Toprol 50 mg daily  2. Secondary Hypercoagulable State (ICD10:  D68.69) The patient is at significant risk for stroke/thromboembolism based upon her CHA2DS2-VASc Score of 6.  Continue Apixaban (Eliquis).   3. SSS S/p PPM, followed by Dr September and the device clinic.  4. HTN Stable, no changes today.  5. Severe aortic stenosis S/p TAVR 10/12/20  6. Syncope Appears vasovagal in setting of straining with BM. May need f/u with PCP for constipation.    Follow up in the AF clinic post DCCV.    Ricky Mohannad Olivero PA-C [facility] [address] [place], [place] [address] [phone] 05/26/2022 11:20 AM 

## 2022-05-26 NOTE — Progress Notes (Signed)
Primary Care Physician: Jacelyn Pi, Lilia Argue, MD Primary Cardiologist: Dr Acie Fredrickson Primary Electrophysiologist: Dr Caryl Comes Referring Physician: Dr Klein   Kaitlyn [name] is a 81 y.o. female with a history of CVA (while off a/c 2/2 GIB), AFib, remote DVT, HTN, SSSx w/PPM, AS who presents for follow up in the [facility].  Patient was admitted to [facility] 10/15/19 with increasing DOE, SOB, edema, noted with CHF. During her hospitalization, it was noted that her PPM has reached ERI and she underwent a gen change at that time. Patient does have a history of stroke while off anticoagulation. 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 Patient is on Eliquis for a CHADS2VASC score of 7. The device clinic received an alert for persistent afib on 04/15/20 with RVR. Patient reports she was unaware of her afib. There were no triggers that she could identify. She is s/p TAVR 10/12/20.   On follow up today, the device clinic noted that she has been in persistent afib for about 4 months. Patient is unaware of her arrhythmia. She did have a "passing out" spell witnessed by her husband about two weeks ago. Patient was straining on the toilet with a bowel movement when she lost consciousness. Her husband laid her on the bed and she quickly returned to her baseline. She did not go to the ED for evaluation.   Today, she denies symptoms of palpitations, chest pain, shortness of breath, orthopnea, PND, lower extremity edema, dizziness, presyncope, snoring, daytime somnolence, bleeding. The patient is tolerating medications without difficulties and is otherwise without complaint today.    Atrial Fibrillation Risk Factors:  she does not have symptoms or diagnosis of sleep apnea. she does not have a history of rheumatic fever.   she has a BMI of Body mass index is 21.51 kg/m.Marland Kitchen Filed Weights   05/26/22 1108  Weight: 53.3 kg    Family  History  Problem Relation Age of Onset   Stroke Father    Alzheimer's disease Mother      Atrial Fibrillation Management history:  Previous antiarrhythmic drugs: propafenone, amiodarone  Previous cardioversions: none Previous ablations: none CHADS2VASC score: 7 Anticoagulation history: Eliquis   Past Medical History:  Diagnosis Date   Aftercare following left shoulder joint replacement surgery 01/20/2018   Age-related osteoporosis with current pathological fracture 11/13/2019   Anxiety 05/25/2014   Arthritis    both knees   Benign essential hypertension 12/14/2014   Bilateral primary osteoarthritis of knee 11/13/2019   Chronic pain    Class 1 obesity due to excess calories with serious comorbidity and body mass index (BMI) of 31.0 to 31.9 in adult 09/19/2013   Closed 3-part fracture of proximal end of left humerus 01/23/2017   Closed fracture of lower end of left radius with routine healing 08/02/2017   Congestive heart failure, NYHA class III, acute on chronic, diastolic ([place]) 0000000   CVA (cerebral vascular accident) ([place]) 06/15/2019   Encounter for long-term (current) use of aspirin 12/27/2019   GERD (gastroesophageal reflux disease) 06/15/2019   GI bleed 06/15/2019   Glucose intolerance (impaired glucose tolerance) 09/02/2014   History of cardioembolic cerebrovascular accident (CVA) 06/15/2019   History of fall 07/02/2019   History of GI bleed    History of ischemic stroke 10/15/2019   History of stroke 05/2019   Insomnia 02/18/2016   Iron deficiency anemia 06/15/2019   Long term prescription benzodiazepine use 11/13/2019   Osteopenia of multiple sites  11/25/2018   Pacemaker-MDT 01/19/2020   Persistent atrial fibrillation ([place])    Pneumonia due to infectious organism 02/01/2020   Prediabetes 11/13/2019   Presence of permanent cardiac pacemaker    placed for SSS- Medtronic device    Primary osteoarthritis of left knee 01/16/2019   Primary osteoarthritis of right knee 01/16/2019    Reactive depression 11/13/2019   RLS (restless legs syndrome) 11/13/2019   S/P placement of cardiac pacemaker 02/01/2020   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 Angelena Form & Dr. 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 07/02/2019   VHD (valvular heart disease) 06/16/2019   VSD (ventricular septal defect) 09/19/2013   Past Surgical History:  Procedure Laterality Date   CERVICAL SPINE SURGERY     EYE SURGERY Left    PACEMAKER IMPLANT     PPM GENERATOR CHANGEOUT N/A 10/17/2019   Procedure: PPM GENERATOR CHANGEOUT;  Surgeon: Deboraha Sprang, MD;  Location: [facility];  Service: Cardiovascular;  Laterality: N/A;   shoulder replacement Left    TEE WITHOUT CARDIOVERSION N/A 10/12/2020   Procedure: TRANSESOPHAGEAL ECHOCARDIOGRAM (TEE);  Surgeon: Burnell Blanks, MD;  Location: [facility];  Service: Open Heart Surgery;  Laterality: N/A;   TRANSCATHETER AORTIC VALVE REPLACEMENT, TRANSFEMORAL N/A 10/12/2020   Procedure: TRANSCATHETER AORTIC VALVE REPLACEMENT, TRANSFEMORAL;  Surgeon: Burnell Blanks, MD;  Location: [facility];  Service: Open Heart Surgery;  Laterality: N/A;    Current Outpatient Medications  Medication Sig Dispense Refill   albuterol (VENTOLIN HFA) 108 (90 Base) MCG/ACT inhaler Inhale 2 puffs into the lungs every 6 (six) hours as needed for wheezing or shortness of breath. 8 g 2   amiodarone (PACERONE) 200 MG tablet TAKE 1/2 TABLET DAILY (NONE ON WEEKENDS) 90 tablet 3   apixaban (ELIQUIS) 5 MG TABS tablet Take 1 tablet (5 mg total) by mouth 2 (two) times daily. 180 tablet 3   furosemide (LASIX) 20 MG tablet TAKE 1 TABLET EVERY MORNING 90 tablet 1   sertraline (ZOLOFT) 25 MG tablet Take 1 tablet by mouth daily.     No current facility-administered medications for this encounter.    No Known Allergies   Social  History   Socioeconomic History   Marital status: Married    Spouse name: Not on file   Number of children: 0   Years of education: Not on file   Highest education level: Not on file  Occupational History   Occupation: Accountant  Tobacco Use   Smoking status: Every Day    Types: E-cigarettes   Smokeless tobacco: Never   Tobacco comments:    vaping all day  Vaping Use   Vaping Use: Every day   Start date: 10/27/2013  Substance and Sexual Activity   Alcohol use: Never   Drug use: Never   Sexual activity: Not on file  Other Topics Concern   Not on file  Social History Narrative   Not on file   Social Determinants of Health   Financial Resource Strain: Not on file  Food Insecurity: Not on file  Transportation Needs: Not on file  Physical Activity: Not on file  Stress: Not on file  Social Connections: Not on file  Intimate Partner Violence: Not on file     ROS- All systems are reviewed and negative except as per the HPI above.  Physical Exam: Vitals:   05/26/22 1108  BP: (!) 128/90  Pulse: (!) 114  Weight: 53.3 kg  Height: 5\' 2"  (1.575 m)    GEN- The patient is a well appearing elderly female, alert and oriented x 3 today.   HEENT-head normocephalic, atraumatic, sclera clear, conjunctiva pink, hearing intact, trachea midline. Lungs- Clear to ausculation bilaterally, normal work of breathing Heart- irregular rate and rhythm, no murmurs, rubs or gallops  GI- soft, NT, ND, + BS Extremities- no clubbing, cyanosis, or edema MS- no significant deformity or atrophy Skin- no rash or lesion Psych- euthymic mood, full affect Neuro- strength and sensation are intact   Wt Readings from Last 3 Encounters:  05/26/22 53.3 kg  09/22/21 56.7 kg  09/16/21 55.3 kg    EKG today demonstrates  Afib, LAFB Vent. rate 114 BPM PR interval * ms QRS duration 110 ms QT/QTcB 354/487 ms  Echo 11/07/21 demonstrated   1. 23 mm S3. Vmax 3.4 m/s, MG 25 mmHG, EOA 0.96 cm2, DI 0.23.  Findings suggestive significant stenosis of the prosthetic valve. There is also  regurgitation which appears to be central. Would recommend gated cardiac  CTA for further evaluation. The aortic valve has been repaired/replaced. Aortic valve regurgitation is mild. There is a 23 mm valve present in the aortic position. Procedure Date: 10/12/2020.   2. Left ventricular ejection fraction, by estimation, is 70 to 75%. The  left ventricle has hyperdynamic function. The left ventricle has no  regional wall motion abnormalities. There is severe concentric left  ventricular hypertrophy. Left ventricular diastolic parameters are consistent with Grade II diastolic dysfunction (pseudonormalization).   3. Right ventricular systolic function is normal. The right ventricular  size is normal. There is normal pulmonary artery systolic pressure. The  estimated right ventricular systolic pressure is 25.3 mmHg.   4. Left atrial size was severely dilated.   5. The mitral valve is degenerative. Moderate mitral valve regurgitation.  No evidence of mitral stenosis.   6. There is mild dilatation of the ascending aorta, measuring 41 mm.   7. The inferior vena cava is normal in size with greater than 50%  respiratory variability, suggesting right atrial pressure of 3 mmHg.   Comparison(s): Changes from prior study are noted. Prosthetic valve  stenosis/regurgitation is now present.   Epic records are reviewed at length today  CHA2DS2-VASc Score = 6  The patient's score is based upon: CHF History: 0 HTN History: 1 Diabetes History: 0 Stroke History: 2 Vascular Disease History: 0 Age Score: 2 Gender Score: 1        ASSESSMENT AND PLAN: 1. Persistent Atrial Fibrillation (ICD10:  I48.19) The patient's CHA2DS2-VASc score is 6, indicating a 9.7% annual risk of stroke.   Patient has been in persistent afib since August.  We discussed rhythm control options. Will arrange for DCCV. Her husband is unsure if she has  missed any doses of anticoagulation in the past 3 weeks. They are sure she took it this AM. Will schedule DCCV 3 weeks from today.  Continue amiodarone 100 mg daily (skip Sat/Sun) Continue Eliquis 5 mg BID, stressed importance of taking this.  Continue Toprol 50 mg daily  2. Secondary Hypercoagulable State (ICD10:  D68.69) The patient is at significant risk for stroke/thromboembolism based upon her CHA2DS2-VASc Score of 6.  Continue Apixaban (Eliquis).   3. SSS S/p PPM, followed by Dr September and the device clinic.  4. HTN Stable, no changes today.  5. Severe aortic stenosis S/p TAVR 10/12/20  6. Syncope Appears vasovagal in setting of straining with BM. May need f/u with PCP for constipation.    Follow up in the AF clinic post DCCV.    [facility] [address] [place], [place] [address] [phone] 05/26/2022 11:20 AM

## 2022-06-16 ENCOUNTER — Encounter: Payer: Self-pay | Admitting: Cardiovascular Disease

## 2022-06-16 ENCOUNTER — Other Ambulatory Visit (HOSPITAL_COMMUNITY): Payer: Medicare HMO | Admitting: Physician Assistant

## 2022-06-16 ENCOUNTER — Telehealth: Payer: Self-pay | Admitting: Cardiology

## 2022-06-16 NOTE — Addendum Note (Signed)
Addended by: Beatrix Fetters on: 06/16/2022 11:36 AM   Modules accepted: Orders

## 2022-06-16 NOTE — Telephone Encounter (Signed)
Error

## 2022-06-16 NOTE — Telephone Encounter (Signed)
Patient's husband is requesting to discuss instructions for 06/19/22 procedure with Dr. Harriet Masson.

## 2022-06-16 NOTE — Telephone Encounter (Signed)
Spoke with pt's husband, Coralyn Mark (ok per [facility]) regarding upcoming cardioversion scheduled for 1/22. Per chart pt was supposed to come in for lab work today, however husband states that they will not make it today. They live in [place] and husband is having difficulty getting pt to appointments due to progressive dementia. Advised husband that cardioversion is scheduled at 11am, let husband know that they would need to arrive 2 hours early in order to get labs prior to procedure. Pt's husband also wants to know if pt should take all medications the morning of the procedure. Advised that pt should only hold lasix on the morning of the procedure, reinterated the importance of not missing any doses of eliquis. Husband verbalizes understanding.

## 2022-06-19 ENCOUNTER — Ambulatory Visit (HOSPITAL_BASED_OUTPATIENT_CLINIC_OR_DEPARTMENT_OTHER): Payer: Medicare HMO | Admitting: Anesthesiology

## 2022-06-19 ENCOUNTER — Ambulatory Visit (HOSPITAL_COMMUNITY)
Admission: RE | Admit: 2022-06-19 | Discharge: 2022-06-19 | Disposition: A | Discharge status: Home / Self Care | Payer: Medicare HMO | Attending: Cardiology | Admitting: Cardiology

## 2022-06-19 ENCOUNTER — Encounter (HOSPITAL_COMMUNITY)
Admission: RE | Disposition: A | Discharge status: Home / Self Care | Payer: Self-pay | Source: Home / Self Care | Attending: Cardiology

## 2022-06-19 ENCOUNTER — Ambulatory Visit (HOSPITAL_COMMUNITY): Payer: Medicare HMO | Admitting: Anesthesiology

## 2022-06-19 ENCOUNTER — Encounter (HOSPITAL_COMMUNITY): Payer: Self-pay | Admitting: Cardiology

## 2022-06-19 DIAGNOSIS — F32A Depression, unspecified: Secondary | ICD-10-CM | POA: Insufficient documentation

## 2022-06-19 DIAGNOSIS — F418 Other specified anxiety disorders: Secondary | ICD-10-CM | POA: Diagnosis not present

## 2022-06-19 DIAGNOSIS — I110 Hypertensive heart disease with heart failure: Secondary | ICD-10-CM | POA: Diagnosis not present

## 2022-06-19 DIAGNOSIS — I350 Nonrheumatic aortic (valve) stenosis: Secondary | ICD-10-CM | POA: Insufficient documentation

## 2022-06-19 DIAGNOSIS — I5032 Chronic diastolic (congestive) heart failure: Secondary | ICD-10-CM | POA: Insufficient documentation

## 2022-06-19 DIAGNOSIS — F17210 Nicotine dependence, cigarettes, uncomplicated: Secondary | ICD-10-CM

## 2022-06-19 DIAGNOSIS — F419 Anxiety disorder, unspecified: Secondary | ICD-10-CM | POA: Insufficient documentation

## 2022-06-19 DIAGNOSIS — I4891 Unspecified atrial fibrillation: Secondary | ICD-10-CM

## 2022-06-19 DIAGNOSIS — Z7901 Long term (current) use of anticoagulants: Secondary | ICD-10-CM | POA: Diagnosis not present

## 2022-06-19 DIAGNOSIS — D6869 Other thrombophilia: Secondary | ICD-10-CM | POA: Diagnosis not present

## 2022-06-19 DIAGNOSIS — Z8673 Personal history of transient ischemic attack (TIA), and cerebral infarction without residual deficits: Secondary | ICD-10-CM | POA: Diagnosis not present

## 2022-06-19 DIAGNOSIS — Z952 Presence of prosthetic heart valve: Secondary | ICD-10-CM | POA: Insufficient documentation

## 2022-06-19 DIAGNOSIS — I495 Sick sinus syndrome: Secondary | ICD-10-CM | POA: Insufficient documentation

## 2022-06-19 DIAGNOSIS — Z950 Presence of cardiac pacemaker: Secondary | ICD-10-CM | POA: Insufficient documentation

## 2022-06-19 DIAGNOSIS — I4819 Other persistent atrial fibrillation: Secondary | ICD-10-CM | POA: Diagnosis not present

## 2022-06-19 DIAGNOSIS — M1990 Unspecified osteoarthritis, unspecified site: Secondary | ICD-10-CM | POA: Diagnosis not present

## 2022-06-19 DIAGNOSIS — I5030 Unspecified diastolic (congestive) heart failure: Secondary | ICD-10-CM | POA: Diagnosis not present

## 2022-06-19 DIAGNOSIS — F17290 Nicotine dependence, other tobacco product, uncomplicated: Secondary | ICD-10-CM | POA: Diagnosis not present

## 2022-06-19 HISTORY — PX: CARDIOVERSION: SHX1299

## 2022-06-19 LAB — POCT I-STAT, CHEM 8
BUN: 13 mg/dL (ref 8–23)
Calcium, Ion: 1.26 mmol/L (ref 1.15–1.40)
Chloride: 102 mmol/L (ref 98–111)
Creatinine, Ser: 0.9 mg/dL (ref 0.44–1.00)
Glucose, Bld: 98 mg/dL (ref 70–99)
HCT: 47 % — ABNORMAL HIGH (ref 36.0–46.0)
Hemoglobin: 16 g/dL — ABNORMAL HIGH (ref 12.0–15.0)
Potassium: 4.4 mmol/L (ref 3.5–5.1)
Sodium: 142 mmol/L (ref 135–145)
TCO2: 30 mmol/L (ref 22–32)

## 2022-06-19 SURGERY — CARDIOVERSION
Anesthesia: General

## 2022-06-19 MED ORDER — SODIUM CHLORIDE 0.9 % IV SOLN: INTRAVENOUS | Status: DC

## 2022-06-19 MED ORDER — PROPOFOL 10 MG/ML IV BOLUS
INTRAVENOUS | Status: DC | PRN
  Administered 2022-06-19: 50 mg via INTRAVENOUS

## 2022-06-19 MED ORDER — LIDOCAINE HCL (CARDIAC) PF 100 MG/5ML IV SOSY
PREFILLED_SYRINGE | INTRAVENOUS | Status: DC | PRN
  Administered 2022-06-19: 30 mg via INTRAVENOUS

## 2022-06-19 NOTE — Interval H&P Note (Signed)
History and Physical Interval Note:  06/19/2022 9:03 AM  Kaitlyn [name]  has presented today for surgery, with the diagnosis of AFIB.  The various methods of treatment have been discussed with the patient and family. After consideration of risks, benefits and other options for treatment, the patient has consented to  Procedure(s): CARDIOVERSION (N/A) as a surgical intervention.  The patient's history has been reviewed, patient examined, no change in status, stable for surgery.  I have reviewed the patient's chart and labs.  Questions were answered to the patient's satisfaction.     Meilech Virts

## 2022-06-19 NOTE — CV Procedure (Signed)
   Electrical Cardioversion Procedure Note Kaitlyn [name] [id number] 07-01-1940  Procedure: Electrical Cardioversion Indications:  Atrial Fibrillation  Time Out: Verified patient identification, verified procedure,medications/allergies/relevent history reviewed, required imaging and test results available.  Performed  Procedure Details  The patient signed informed consent.   The patient was NPO past midnight. Has had therapeutic anticoagulation with Eliquis greater than 3 weeks. The patient denies any interruption of anticoagulation.  Anesthesia was administered by Dr. Lanetta Inch.  Adequate airway was maintained throughout and vital followed per protocol.  He was cardioverted x 1 with 200 J of biphasic synchronized energy.  He converted to NSR.  There were no apparent complications.  The patient tolerated the procedure well and had normal neuro status and respiratory status post procedure with vitals stable as recorded elsewhere.     IMPRESSION:  Successful cardioversion of atrial fibrillation to sinus rhythm.   Follow up:  We will arrange follow up with primary cardiologist.  He will continue on current medical therapy.  The patient advised to continue anticoagulation.  Kaitlyn [name] 06/19/2022, 11:04 AM

## 2022-06-19 NOTE — Anesthesia Preprocedure Evaluation (Addendum)
Anesthesia Evaluation  Patient identified by MRN, date of birth, ID band Patient awake    Reviewed: Allergy & Precautions, NPO status , Patient's Chart, lab work & pertinent test results  Airway Mallampati: II  TM Distance: >3 FB Neck ROM: Full    Dental  (+) Dental Advisory Given, Teeth Intact   Pulmonary Current Smoker and Patient abstained from smoking.   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 23 mm S3. Vmax 3.4 m/s, MG 25 mmHG, EOA 0.96 cm2, DI 0.23. 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 There is also  regurgitation which appears to be central. Would recommend gated cardiac  CTA for further evaluation. The aortic   valve has been repaired/replaced. Aortic valve regurgitation is mild.  There is a 23 mm valve present in the aortic position. Procedure Date:  10/12/2020.   2. Left ventricular ejection fraction, by estimation, is 70 to 75%. The  left ventricle has hyperdynamic function. The left ventricle has no  regional wall motion abnormalities. There is severe concentric left  ventricular hypertrophy. Left ventricular  diastolic parameters are consistent with Grade II diastolic dysfunction  (pseudonormalization).   3. Right ventricular systolic function is normal. The right ventricular  size is normal. There is normal pulmonary artery systolic pressure. The  estimated right ventricular systolic pressure is 16.1 mmHg.   4. Left atrial size was severely dilated.   5. The mitral valve is degenerative. Moderate mitral valve regurgitation.  No evidence of mitral stenosis.   6. There is mild dilatation of the ascending aorta, measuring 41 mm.   7. The inferior  vena cava is normal in size with greater than 50%  respiratory variability, suggesting right atrial pressure of 3 mmHg.     Neuro/Psych  PSYCHIATRIC DISORDERS Anxiety Depression    CVA    GI/Hepatic Neg liver ROS,GERD  ,,  Endo/Other  negative endocrine ROS    Renal/GU negative Renal ROS  negative genitourinary   Musculoskeletal  (+) Arthritis ,    Abdominal   Peds  Hematology negative hematology ROS (+)   Anesthesia Other Findings   Reproductive/Obstetrics                             Anesthesia Physical Anesthesia Plan  ASA: 3  Anesthesia Plan: General   Post-op Pain Management:    Induction: Intravenous  PONV Risk Score and Plan: Propofol infusion and Treatment may vary due to age or medical condition  Airway Management Planned: Natural Airway  Additional Equipment:   Intra-op Plan:   Post-operative Plan:   Informed Consent: I have reviewed the patients History and Physical, chart, labs and discussed the procedure including the risks, benefits and alternatives for the proposed anesthesia with the patient or authorized representative who has indicated his/her understanding and acceptance.     Dental advisory given  Plan Discussed with: CRNA  Anesthesia Plan Comments:        Anesthesia Quick Evaluation

## 2022-06-19 NOTE — Discharge Instructions (Signed)

## 2022-06-19 NOTE — Transfer of Care (Signed)
Immediate Anesthesia Transfer of Care Note  Patient: Kaitlyn [name]  Procedure(s) Performed: CARDIOVERSION  Patient Location: PACU and Endoscopy Unit  Anesthesia Type:General  Level of Consciousness: drowsy and patient cooperative  Airway & Oxygen Therapy: Patient Spontanous Breathing and Patient connected to nasal cannula oxygen  Post-op Assessment: Report given to RN and Post -op Vital signs reviewed and stable  Post vital signs: Reviewed and stable  Last Vitals:  Vitals Value Taken Time  BP    Temp    Pulse    Resp    SpO2      Last Pain:  Vitals:   06/19/22 0852  TempSrc: Temporal  PainSc: 0-No pain         Complications: No notable events documented.

## 2022-06-20 ENCOUNTER — Encounter (HOSPITAL_COMMUNITY): Payer: Self-pay | Admitting: Cardiology

## 2022-06-20 NOTE — Anesthesia Postprocedure Evaluation (Signed)
Anesthesia Post Note  Patient: [name]  Procedure(s) Performed: CARDIOVERSION     Patient location during evaluation: Endoscopy Anesthesia Type: General Level of consciousness: awake and alert Pain management: pain level controlled Vital Signs Assessment: post-procedure vital signs reviewed and stable Respiratory status: spontaneous breathing, nonlabored ventilation, respiratory function stable and patient connected to nasal cannula oxygen Cardiovascular status: blood pressure returned to baseline and stable Postop Assessment: no apparent nausea or vomiting Anesthetic complications: no  No notable events documented.  Last Vitals:  Vitals:   06/19/22 1115 06/19/22 1125  BP: (!) 151/94 (!) 159/94  Pulse: 60 (!) 59  Resp: 18 16  Temp:    SpO2: 100% 100%    Last Pain:  Vitals:   06/19/22 1125  TempSrc:   PainSc: 0-No pain                 Kollin Udell L Anaiza Behrens

## 2022-06-26 ENCOUNTER — Encounter (HOSPITAL_COMMUNITY): Payer: Self-pay

## 2022-06-26 ENCOUNTER — Ambulatory Visit (HOSPITAL_COMMUNITY): Payer: Medicare HMO | Admitting: Physician Assistant

## 2022-07-13 ENCOUNTER — Ambulatory Visit (INDEPENDENT_AMBULATORY_CARE_PROVIDER_SITE_OTHER): Payer: Medicare HMO

## 2022-07-13 DIAGNOSIS — I495 Sick sinus syndrome: Secondary | ICD-10-CM | POA: Diagnosis not present

## 2022-07-14 LAB — CUP PACEART REMOTE DEVICE CHECK
Battery Remaining Longevity: 135 mo
Battery Voltage: 3.03 V
Brady Statistic AP VP Percent: 0.12 %
Brady Statistic AP VS Percent: 86.18 %
Brady Statistic AS VP Percent: 0 %
Brady Statistic AS VS Percent: 13.7 %
Brady Statistic RA Percent Paced: 86.16 %
Brady Statistic RV Percent Paced: 0.2 %
Date Time Interrogation Session: 20240215224104
Implantable Lead Connection Status: 753985
Implantable Lead Connection Status: 753985
Implantable Lead Implant Date: 20091201
Implantable Lead Implant Date: 20091209
Implantable Lead Location: 753859
Implantable Lead Location: 753860
Implantable Lead Model: 4076
Implantable Lead Model: 4076
Implantable Pulse Generator Implant Date: 20210521
Lead Channel Impedance Value: 323 Ohm
Lead Channel Impedance Value: 380 Ohm
Lead Channel Impedance Value: 608 Ohm
Lead Channel Impedance Value: 646 Ohm
Lead Channel Pacing Threshold Amplitude: 0.5 V
Lead Channel Pacing Threshold Amplitude: 0.625 V
Lead Channel Pacing Threshold Pulse Width: 0.4 ms
Lead Channel Pacing Threshold Pulse Width: 0.4 ms
Lead Channel Sensing Intrinsic Amplitude: 1.75 mV
Lead Channel Sensing Intrinsic Amplitude: 1.75 mV
Lead Channel Sensing Intrinsic Amplitude: 13.125 mV
Lead Channel Sensing Intrinsic Amplitude: 13.125 mV
Lead Channel Setting Pacing Amplitude: 1.5 V
Lead Channel Setting Pacing Amplitude: 2.5 V
Lead Channel Setting Pacing Pulse Width: 0.4 ms
Lead Channel Setting Sensing Sensitivity: 5.6 mV
Zone Setting Status: 755011
Zone Setting Status: 755011

## 2022-08-08 NOTE — Progress Notes (Signed)
Remote pacemaker transmission.   

## 2022-10-12 ENCOUNTER — Ambulatory Visit (INDEPENDENT_AMBULATORY_CARE_PROVIDER_SITE_OTHER): Payer: Medicare HMO

## 2022-10-12 DIAGNOSIS — I495 Sick sinus syndrome: Secondary | ICD-10-CM | POA: Diagnosis not present

## 2022-10-12 LAB — CUP PACEART REMOTE DEVICE CHECK
Battery Remaining Longevity: 132 mo
Battery Voltage: 3.02 V
Brady Statistic AP VP Percent: 0.07 %
Brady Statistic AP VS Percent: 89.04 %
Brady Statistic AS VP Percent: 0 %
Brady Statistic AS VS Percent: 10.9 %
Brady Statistic RA Percent Paced: 85.55 %
Brady Statistic RV Percent Paced: 0.62 %
Date Time Interrogation Session: 20240516015354
Implantable Lead Connection Status: 753985
Implantable Lead Connection Status: 753985
Implantable Lead Implant Date: 20091201
Implantable Lead Implant Date: 20091209
Implantable Lead Location: 753859
Implantable Lead Location: 753860
Implantable Lead Model: 4076
Implantable Lead Model: 4076
Implantable Pulse Generator Implant Date: 20210521
Lead Channel Impedance Value: 323 Ohm
Lead Channel Impedance Value: 380 Ohm
Lead Channel Impedance Value: 589 Ohm
Lead Channel Impedance Value: 646 Ohm
Lead Channel Pacing Threshold Amplitude: 0.5 V
Lead Channel Pacing Threshold Amplitude: 0.625 V
Lead Channel Pacing Threshold Pulse Width: 0.4 ms
Lead Channel Pacing Threshold Pulse Width: 0.4 ms
Lead Channel Sensing Intrinsic Amplitude: 17 mV
Lead Channel Sensing Intrinsic Amplitude: 17 mV
Lead Channel Sensing Intrinsic Amplitude: 2 mV
Lead Channel Sensing Intrinsic Amplitude: 2 mV
Lead Channel Setting Pacing Amplitude: 1.5 V
Lead Channel Setting Pacing Amplitude: 2.5 V
Lead Channel Setting Pacing Pulse Width: 0.4 ms
Lead Channel Setting Sensing Sensitivity: 5.6 mV
Zone Setting Status: 755011
Zone Setting Status: 755011

## 2022-10-26 NOTE — Progress Notes (Signed)
Remote pacemaker transmission.   

## 2022-10-27 ENCOUNTER — Telehealth: Payer: Self-pay

## 2022-10-27 NOTE — Telephone Encounter (Signed)
-----   Message from Duke Salvia, MD sent at 10/26/2022  1:07 PM EDT ----- Remote reviewed. This remote is abnormal for persistent atrial fib  Kaitlyn [name]  can we get her to transmit to see if afib persists please Thanks SK

## 2022-10-27 NOTE — Telephone Encounter (Signed)
I called the patient to let her know that Dr. Graciela Husbands would like a transmission with her home remote monitor.   The patient states that she would like some recommendations on finding a heart doctor in [place]. She and her husband can not drive long distance anymore.   I told her I will send Dr. Graciela Husbands a phone note and let him know when the transmission comes through.

## 2022-10-31 NOTE — Telephone Encounter (Signed)
Patient is in NSR.

## 2022-10-31 NOTE — Telephone Encounter (Signed)
Transmission received 10/27/2022

## 2022-11-10 NOTE — Telephone Encounter (Signed)
Call cannot be completed as dialed. Attempted all numbers and all state the same message.

## 2022-11-10 NOTE — Telephone Encounter (Signed)
Kaitlyn [name] and [name]  this lady has persistent atrial fibrillation Elby Showers would you lkie to see her-- I think she is yours tho last seen 2023,or should we send her (back) to afib clinic to craft a plan and next steps Thanks

## 2022-11-13 NOTE — Telephone Encounter (Signed)
Call cannot be completed as dialed. Attempted all numbers and all state the same message.

## 2022-11-16 NOTE — Telephone Encounter (Signed)
Called to schedule an appt/no valid number in chart/kbl 11/16/22

## 2022-11-22 ENCOUNTER — Other Ambulatory Visit: Payer: Self-pay | Admitting: Physician Assistant

## 2022-11-22 DIAGNOSIS — Z952 Presence of prosthetic heart valve: Secondary | ICD-10-CM

## 2022-11-22 NOTE — Telephone Encounter (Signed)
This is a A-Fib clinic pt 

## 2022-11-23 NOTE — Telephone Encounter (Signed)
Called to schedule an appt/no valid number in chart/kbl 11/16/22 

## 2023-01-11 ENCOUNTER — Ambulatory Visit: Payer: Medicare HMO

## 2023-01-11 DIAGNOSIS — I495 Sick sinus syndrome: Secondary | ICD-10-CM | POA: Diagnosis not present

## 2023-01-11 LAB — CUP PACEART REMOTE DEVICE CHECK
Battery Remaining Longevity: 129 mo
Battery Voltage: 3.01 V
Brady Statistic AP VP Percent: 0.17 %
Brady Statistic AP VS Percent: 79.02 %
Brady Statistic AS VP Percent: 0 %
Brady Statistic AS VS Percent: 20.85 %
Brady Statistic RA Percent Paced: 39.24 %
Brady Statistic RV Percent Paced: 16.26 %
Date Time Interrogation Session: 20240814212544
Implantable Lead Connection Status: 753985
Implantable Lead Connection Status: 753985
Implantable Lead Implant Date: 20091201
Implantable Lead Implant Date: 20091209
Implantable Lead Location: 753859
Implantable Lead Location: 753860
Implantable Lead Model: 4076
Implantable Lead Model: 4076
Implantable Pulse Generator Implant Date: 20210521
Lead Channel Impedance Value: 323 Ohm
Lead Channel Impedance Value: 380 Ohm
Lead Channel Impedance Value: 513 Ohm
Lead Channel Impedance Value: 608 Ohm
Lead Channel Pacing Threshold Amplitude: 0.625 V
Lead Channel Pacing Threshold Amplitude: 0.625 V
Lead Channel Pacing Threshold Pulse Width: 0.4 ms
Lead Channel Pacing Threshold Pulse Width: 0.4 ms
Lead Channel Sensing Intrinsic Amplitude: 1.75 mV
Lead Channel Sensing Intrinsic Amplitude: 1.75 mV
Lead Channel Sensing Intrinsic Amplitude: 13.25 mV
Lead Channel Sensing Intrinsic Amplitude: 13.25 mV
Lead Channel Setting Pacing Amplitude: 1.5 V
Lead Channel Setting Pacing Amplitude: 2.5 V
Lead Channel Setting Pacing Pulse Width: 0.4 ms
Lead Channel Setting Sensing Sensitivity: 5.6 mV
Zone Setting Status: 755011
Zone Setting Status: 755011

## 2023-01-23 NOTE — Progress Notes (Signed)
Remote pacemaker transmission.
